# Patient Record
Sex: Male | Born: 1975
Health system: Southern US, Community
[De-identification: ages and names within clinical notes are randomized; demographics above are authoritative.]

## PROBLEM LIST (undated history)

## (undated) ENCOUNTER — Emergency Department (HOSPITAL_BASED_OUTPATIENT_CLINIC_OR_DEPARTMENT_OTHER): Admission: EM | Payer: Managed Care, Other (non HMO) | Source: Home / Self Care

## (undated) DIAGNOSIS — I10 Essential (primary) hypertension: Secondary | ICD-10-CM

## (undated) HISTORY — PX: FEMUR FRACTURE SURGERY: SHX633

---

## 2007-04-05 ENCOUNTER — Emergency Department (HOSPITAL_COMMUNITY): Admission: EM | Admit: 2007-04-05 | Discharge: 2007-04-06 | Payer: Self-pay | Admitting: Emergency Medicine

## 2007-05-01 ENCOUNTER — Emergency Department (HOSPITAL_COMMUNITY): Admission: EM | Admit: 2007-05-01 | Discharge: 2007-05-01 | Payer: Self-pay | Admitting: *Deleted

## 2010-09-20 ENCOUNTER — Emergency Department (INDEPENDENT_AMBULATORY_CARE_PROVIDER_SITE_OTHER): Payer: Managed Care, Other (non HMO)

## 2010-09-20 ENCOUNTER — Emergency Department (HOSPITAL_BASED_OUTPATIENT_CLINIC_OR_DEPARTMENT_OTHER)
Admission: EM | Admit: 2010-09-20 | Discharge: 2010-09-20 | Disposition: A | Discharge status: Home / Self Care | Payer: Managed Care, Other (non HMO) | Attending: Emergency Medicine | Admitting: Emergency Medicine

## 2010-09-20 DIAGNOSIS — M25579 Pain in unspecified ankle and joints of unspecified foot: Secondary | ICD-10-CM

## 2010-09-20 DIAGNOSIS — S82899A Other fracture of unspecified lower leg, initial encounter for closed fracture: Secondary | ICD-10-CM | POA: Insufficient documentation

## 2010-09-20 DIAGNOSIS — W230XXA Caught, crushed, jammed, or pinched between moving objects, initial encounter: Secondary | ICD-10-CM

## 2010-10-04 ENCOUNTER — Emergency Department (HOSPITAL_BASED_OUTPATIENT_CLINIC_OR_DEPARTMENT_OTHER)
Admission: EM | Admit: 2010-10-04 | Discharge: 2010-10-04 | Disposition: A | Discharge status: Home / Self Care | Payer: Managed Care, Other (non HMO) | Attending: Emergency Medicine | Admitting: Emergency Medicine

## 2010-10-04 DIAGNOSIS — E119 Type 2 diabetes mellitus without complications: Secondary | ICD-10-CM | POA: Insufficient documentation

## 2010-10-04 DIAGNOSIS — F17200 Nicotine dependence, unspecified, uncomplicated: Secondary | ICD-10-CM | POA: Insufficient documentation

## 2010-10-04 DIAGNOSIS — I10 Essential (primary) hypertension: Secondary | ICD-10-CM | POA: Insufficient documentation

## 2010-10-04 DIAGNOSIS — L089 Local infection of the skin and subcutaneous tissue, unspecified: Secondary | ICD-10-CM | POA: Insufficient documentation

## 2010-10-04 DIAGNOSIS — M7989 Other specified soft tissue disorders: Secondary | ICD-10-CM | POA: Insufficient documentation

## 2010-10-04 DIAGNOSIS — Z79899 Other long term (current) drug therapy: Secondary | ICD-10-CM | POA: Insufficient documentation

## 2011-03-23 LAB — I-STAT 8, (EC8 V) (CONVERTED LAB)
BUN: 10
Chloride: 100
HCT: 46
Hemoglobin: 15.6
Operator id: 257131
Sodium: 134 — ABNORMAL LOW

## 2011-08-19 ENCOUNTER — Encounter (HOSPITAL_BASED_OUTPATIENT_CLINIC_OR_DEPARTMENT_OTHER): Payer: Self-pay | Admitting: *Deleted

## 2011-08-19 ENCOUNTER — Emergency Department (HOSPITAL_BASED_OUTPATIENT_CLINIC_OR_DEPARTMENT_OTHER)
Admission: EM | Admit: 2011-08-19 | Discharge: 2011-08-20 | Disposition: A | Discharge status: Home / Self Care | Payer: Managed Care, Other (non HMO) | Attending: Emergency Medicine | Admitting: Emergency Medicine

## 2011-08-19 DIAGNOSIS — E860 Dehydration: Secondary | ICD-10-CM

## 2011-08-19 DIAGNOSIS — R112 Nausea with vomiting, unspecified: Secondary | ICD-10-CM | POA: Insufficient documentation

## 2011-08-19 DIAGNOSIS — E119 Type 2 diabetes mellitus without complications: Secondary | ICD-10-CM | POA: Insufficient documentation

## 2011-08-19 DIAGNOSIS — I10 Essential (primary) hypertension: Secondary | ICD-10-CM | POA: Insufficient documentation

## 2011-08-19 HISTORY — DX: Essential (primary) hypertension: I10

## 2011-08-19 LAB — CBC
Hemoglobin: 12.8 g/dL — ABNORMAL LOW (ref 13.0–17.0)
MCH: 32.9 pg (ref 26.0–34.0)
MCHC: 36.2 g/dL — ABNORMAL HIGH (ref 30.0–36.0)
Platelets: 267 10*3/uL (ref 150–400)

## 2011-08-19 LAB — BASIC METABOLIC PANEL
Calcium: 9.4 mg/dL (ref 8.4–10.5)
Creatinine, Ser: 0.8 mg/dL (ref 0.50–1.35)
GFR calc non Af Amer: >90 mL/min (ref >90)
Glucose, Bld: 239 mg/dL — ABNORMAL HIGH (ref 70–99)
Sodium: 136 mEq/L (ref 135–145)

## 2011-08-19 LAB — GLUCOSE, CAPILLARY: Glucose-Capillary: 246 mg/dL — ABNORMAL HIGH (ref 70–99)

## 2011-08-19 MED ORDER — SODIUM CHLORIDE 0.9 % IV BOLUS (SEPSIS)
1000.0000 mL | Freq: Once | INTRAVENOUS | Status: AC
  Administered 2011-08-19: 1000 mL via INTRAVENOUS

## 2011-08-19 MED ORDER — ONDANSETRON HCL 4 MG/2ML IJ SOLN
4.0000 mg | Freq: Once | INTRAMUSCULAR | Status: AC
  Administered 2011-08-19: 4 mg via INTRAVENOUS
  Filled 2011-08-19: qty 2

## 2011-08-19 NOTE — ED Notes (Signed)
Vomiting today. Denies other symptoms.

## 2011-08-19 NOTE — ED Notes (Signed)
Received pt. From triage, pt. Alert and oriented, ambulatory gait steady, NAD noted 

## 2011-08-20 MED ORDER — ONDANSETRON 8 MG PO TBDP: 8.0000 mg | ORAL_TABLET | Freq: Three times a day (TID) | ORAL | Status: AC | PRN

## 2011-08-20 NOTE — Discharge Instructions (Signed)
Dehydration, Adult Dehydration is when you lose more fluids from the body than you take in. Vital organs like the kidneys, brain, and heart cannot function without a proper amount of fluids and salt. Any loss of fluids from the body can cause dehydration.  CAUSES   Vomiting.   Diarrhea.   Excessive sweating.   Excessive urine output.   Fever.  SYMPTOMS  Mild dehydration  Thirst.   Dry lips.   Slightly dry mouth.  Moderate dehydration  Very dry mouth.   Sunken eyes.   Skin does not bounce back quickly when lightly pinched and released.   Dark urine and decreased urine production.   Decreased tear production.   Headache.  Severe dehydration  Very dry mouth.   Extreme thirst.   Rapid, weak pulse (more than 100 beats per minute at rest).   Cold hands and feet.   Not able to sweat in spite of heat and temperature.   Rapid breathing.   Blue lips.   Confusion and lethargy.   Difficulty being awakened.   Minimal urine production.   No tears.  DIAGNOSIS  Your caregiver will diagnose dehydration based on your symptoms and your exam. Blood and urine tests will help confirm the diagnosis. The diagnostic evaluation should also identify the cause of dehydration. TREATMENT  Treatment of mild or moderate dehydration can often be done at home by increasing the amount of fluids that you drink. It is best to drink small amounts of fluid more often. Drinking too much at one time can make vomiting worse. Refer to the home care instructions below. Severe dehydration needs to be treated at the hospital where you will probably be given intravenous (IV) fluids that contain water and electrolytes. HOME CARE INSTRUCTIONS   Ask your caregiver about specific rehydration instructions.   Drink enough fluids to keep your urine clear or pale yellow.   Drink small amounts frequently if you have nausea and vomiting.   Eat as you normally do.   Avoid:   Foods or drinks high in  sugar.   Carbonated drinks.   Juice.   Extremely hot or cold fluids.   Drinks with caffeine.   Fatty, greasy foods.   Alcohol.   Tobacco.   Overeating.   Gelatin desserts.   Wash your hands well to avoid spreading bacteria and viruses.   Only take over-the-counter or prescription medicines for pain, discomfort, or fever as directed by your caregiver.   Ask your caregiver if you should continue all prescribed and over-the-counter medicines.   Keep all follow-up appointments with your caregiver.  SEEK MEDICAL CARE IF:  You have abdominal pain and it increases or stays in one area (localizes).   You have a rash, stiff neck, or severe headache.   You are irritable, sleepy, or difficult to awaken.   You are weak, dizzy, or extremely thirsty.  SEEK IMMEDIATE MEDICAL CARE IF:   You are unable to keep fluids down or you get worse despite treatment.   You have frequent episodes of vomiting or diarrhea.   You have blood or green matter (bile) in your vomit.   You have blood in your stool or your stool looks black and tarry.   You have not urinated in 6 to 8 hours, or you have only urinated a small amount of very dark urine.   You have a fever.   You faint.  MAKE SURE YOU:   Understand these instructions.   Will watch your condition.     Will get help right away if you are not doing well or get worse.  Document Released: 05/30/2005 Document Revised: 05/19/2011 Document Reviewed: 01/17/2011 [employer] Patient Information 2012 [employer]. 

## 2011-08-20 NOTE — ED Provider Notes (Signed)
History     CSN: [id number]  Arrival date & time 08/19/11  2101   First MD Initiated Contact with Patient 08/19/11 2305      Chief Complaint  Patient presents with  . Emesis     Patient is a 36 y.o. male presenting with vomiting. The history is provided by the patient.  Emesis    patient reports nausea and vomiting today.  He denies diarrhea although states "I feel like I'm about to have diarrhea".  Denies fevers or chills.  His had no abdominal pain.  He denies chest pain.  He denies blood in his vomit.  Nothing worsens the symptoms.  Nothing improves his symptoms.  His symptoms are constant.  His pain is 0 at this time.  He reports several sick contacts at work with similar symptoms  Past Medical History  Diagnosis Date  . Diabetes mellitus   . Hypertension     History reviewed. No pertinent past surgical history.  No family history on file.  History  Substance Use Topics  . Smoking status: Never Smoker   . Smokeless tobacco: Not on file  . Alcohol Use: No      Review of Systems  Gastrointestinal: Positive for vomiting.  All other systems reviewed and are negative.    Allergies  Penicillins  Home Medications   Current Outpatient Rx  Name Route Sig Dispense Refill  . ONDANSETRON 8 MG PO TBDP Oral Take 1 tablet (8 mg total) by mouth every 8 (eight) hours as needed for nausea. 10 tablet 0    BP 130/91  Pulse 72  Temp(Src) 98.3 F (36.8 C) (Oral)  Resp 16  Ht 6\' 2"  (1.88 m)  Wt 220 lb (99.791 kg)  BMI 28.25 kg/m2  SpO2 96%  Physical Exam  Nursing note and vitals reviewed. Constitutional: He is oriented to person, place, and time. He appears well-developed and well-nourished.  HENT:  Head: Normocephalic and atraumatic.       Mucous membranes dry  Eyes: EOM are normal.  Neck: Normal range of motion.  Cardiovascular: Normal rate, regular rhythm, normal heart sounds and intact distal pulses.   Pulmonary/Chest: Effort normal and breath sounds normal.  No respiratory distress.  Abdominal: Soft. He exhibits no distension. There is no tenderness.  Musculoskeletal: Normal range of motion.  Neurological: He is alert and oriented to person, place, and time.  Skin: Skin is warm and dry.  Psychiatric: He has a normal mood and affect. Judgment normal.    ED Course  Procedures (including critical care time)  Labs Reviewed  GLUCOSE, CAPILLARY - Abnormal; Notable for the following:    Glucose-Capillary 246 (*)    All other components within normal limits  CBC - Abnormal; Notable for the following:    RBC 3.89 (*)    Hemoglobin 12.8 (*)    HCT 35.4 (*)    MCHC 36.2 (*)    All other components within normal limits  BASIC METABOLIC PANEL - Abnormal; Notable for the following:    Glucose, Bld 239 (*)    All other components within normal limits   No results found.   1. Nausea and vomiting   2. Dehydration       MDM  The patient's symptoms likely represent an early viral gastroenteritis.  He's been hydrated in the ER.  He feels much better.  Repeat abdominal exam is again benign.  DC home in good conditions with anttiemetics at home        [place]  Larena Glassman, MD 08/20/11 [phone]

## 2012-10-24 ENCOUNTER — Encounter: Payer: Self-pay | Admitting: Podiatry

## 2012-10-24 ENCOUNTER — Ambulatory Visit (INDEPENDENT_AMBULATORY_CARE_PROVIDER_SITE_OTHER): Payer: BC Managed Care – PPO | Admitting: Podiatry

## 2012-10-24 VITALS — BP 128/89 | HR 83 | Ht 74.0 in | Wt 212.0 lb

## 2012-10-24 DIAGNOSIS — B351 Tinea unguium: Secondary | ICD-10-CM

## 2012-10-24 DIAGNOSIS — M25579 Pain in unspecified ankle and joints of unspecified foot: Secondary | ICD-10-CM

## 2012-10-24 HISTORY — DX: Tinea unguium: B35.1

## 2012-10-24 HISTORY — DX: Pain in unspecified ankle and joints of unspecified foot: M25.579

## 2012-10-24 NOTE — Progress Notes (Signed)
Subjective: 37 y.o. year old NIDDM male patient presents complaining of painful nails. Patient requests toe nails trimmed.  Stated that his blood sugar this morning was 120. He wants to know if the nails can be treated. He is on his feet all day. He was seen at this office 2 years ago for painful heel and was treated with Cortisone injection.   Patient Summary List & History reviewed for allergies, medications, medical problems and surgical history.  Objective: Dermatologic:  Thick and hard dystrophic nails x 10. Dry flaky skin plantar bilateral. No open wound or skin lesions noted.  Vascular: All pedal pulses are palpable. No edema or erythema noted.   Orthopedic:  Mild Hallux valgus with bunion bilateral.  Neurologic:  All epicritic and tactile sensations grossly intact.   Assessment: Dystrophic mycotic nails x 10. Tinea pedis plantar bilateral.  Treatment: All mycotic nails debrided.  Advised to scrub both feet with Salsun blue shampoo after each shower.  Advised to have blood work done for liver function during his next visit to his PCP before starting on oral antifungal treatment.  Return in 3 months.

## 2012-11-01 ENCOUNTER — Emergency Department (HOSPITAL_BASED_OUTPATIENT_CLINIC_OR_DEPARTMENT_OTHER)
Admission: EM | Admit: 2012-11-01 | Discharge: 2012-11-01 | Disposition: A | Discharge status: Home / Self Care | Payer: BC Managed Care – PPO | Attending: Emergency Medicine | Admitting: Emergency Medicine

## 2012-11-01 ENCOUNTER — Emergency Department (HOSPITAL_BASED_OUTPATIENT_CLINIC_OR_DEPARTMENT_OTHER): Payer: BC Managed Care – PPO

## 2012-11-01 ENCOUNTER — Encounter (HOSPITAL_BASED_OUTPATIENT_CLINIC_OR_DEPARTMENT_OTHER): Payer: Self-pay | Admitting: *Deleted

## 2012-11-01 DIAGNOSIS — R112 Nausea with vomiting, unspecified: Secondary | ICD-10-CM | POA: Insufficient documentation

## 2012-11-01 DIAGNOSIS — R197 Diarrhea, unspecified: Secondary | ICD-10-CM

## 2012-11-01 DIAGNOSIS — K921 Melena: Secondary | ICD-10-CM | POA: Insufficient documentation

## 2012-11-01 DIAGNOSIS — I10 Essential (primary) hypertension: Secondary | ICD-10-CM | POA: Insufficient documentation

## 2012-11-01 DIAGNOSIS — E119 Type 2 diabetes mellitus without complications: Secondary | ICD-10-CM | POA: Insufficient documentation

## 2012-11-01 DIAGNOSIS — Z880 Allergy status to penicillin: Secondary | ICD-10-CM | POA: Insufficient documentation

## 2012-11-01 DIAGNOSIS — Z79899 Other long term (current) drug therapy: Secondary | ICD-10-CM | POA: Insufficient documentation

## 2012-11-01 DIAGNOSIS — R634 Abnormal weight loss: Secondary | ICD-10-CM | POA: Insufficient documentation

## 2012-11-01 LAB — OCCULT BLOOD X 1 CARD TO LAB, STOOL: Fecal Occult Bld: NEGATIVE

## 2012-11-01 LAB — CBC WITH DIFFERENTIAL/PLATELET
Basophils Absolute: 0 10*3/uL (ref 0.0–0.1)
Basophils Relative: 0 % (ref 0–1)
Eosinophils Absolute: 0.1 10*3/uL (ref 0.0–0.7)
Lymphs Abs: 1.3 10*3/uL (ref 0.7–4.0)
MCH: 33.2 pg (ref 26.0–34.0)
Neutrophils Relative %: 83 % — ABNORMAL HIGH (ref 43–77)
Platelets: 278 10*3/uL (ref 150–400)
RBC: 4.28 MIL/uL (ref 4.22–5.81)
RDW: 12.5 % (ref 11.5–15.5)

## 2012-11-01 LAB — URINALYSIS, ROUTINE W REFLEX MICROSCOPIC
Bilirubin Urine: NEGATIVE
Glucose, UA: NEGATIVE mg/dL
Ketones, ur: 15 mg/dL — AB
Specific Gravity, Urine: 1.029 (ref 1.005–1.030)
pH: 5.5 (ref 5.0–8.0)

## 2012-11-01 LAB — COMPREHENSIVE METABOLIC PANEL
ALT: 17 U/L (ref 0–53)
AST: 20 U/L (ref 0–37)
Albumin: 4.4 g/dL (ref 3.5–5.2)
Alkaline Phosphatase: 53 U/L (ref 39–117)
Calcium: 9.9 mg/dL (ref 8.4–10.5)
Glucose, Bld: 175 mg/dL — ABNORMAL HIGH (ref 70–99)
Potassium: 4.4 mEq/L (ref 3.5–5.1)
Sodium: 139 mEq/L (ref 135–145)
Total Protein: 7.6 g/dL (ref 6.0–8.3)

## 2012-11-01 MED ORDER — PROMETHAZINE HCL 25 MG PO TABS: 25.0000 mg | ORAL_TABLET | Freq: Four times a day (QID) | ORAL | Status: DC | PRN

## 2012-11-01 NOTE — ED Notes (Signed)
Patient states he developed nausea and vomiting x 4 this morning at 0645, followed by a normal bm with blood on the stool and tissue.  States he had progressed to 4 watery diarrheal stools.  Denies pain.  Pt states he went to see his primary doctor today and was given an appointment for tomorrow.  Wife is concerned that the patient's bp is up today, he has lost 8 lbs in the last month and is having rectal bleeding.  Wants to find out today what is going on.  Also is requesting an xray of right leg due to chronic pain from an old femur fracture.  Explained that they may have to follow up with their pcp, and was told that we are the emergency room and we can do anything.

## 2012-11-01 NOTE — ED Notes (Signed)
Upon d/c pt reports pain in right knee radiating to thigh x "months" and requesting an XR.  Denny Peon, PA-C informed and new orders received.  Pt informed of plan of care.

## 2012-11-01 NOTE — ED Notes (Signed)
Report received from Merril Abbe, RN, care assumed.

## 2012-11-01 NOTE — ED Provider Notes (Signed)
History     CSN: [id number]  Arrival date & time 11/01/12  1021   First MD Initiated Contact with Patient 11/01/12 1206      Chief Complaint  Patient presents with  . Emesis    (Consider location/radiation/quality/duration/timing/severity/associated sxs/prior treatment) HPI Pt is a 37yo male accompanied by wife c/o nausea and vomiting x3 this morning without blood or bile.  Pt then had a normal BM with blood on stool and tissue followed by watery diarrhea x4, w/o blood or mucous.   Pt was seen at PCP and given appointment for tomorrow.Denies pain or nausea at this time.  Pt also mentioned he has lost 8lbs over the past month unintentionally but has not mentioned this to his PCP.  Denies known hx of cancer.  Denies back pain, smoking or cough.  Denies fever, sick contacts, or recent travel.  Past Medical History  Diagnosis Date  . Diabetes mellitus   . Hypertension     Past Surgical History  Procedure Laterality Date  . Femur fracture surgery      right    No family history on file.  History  Substance Use Topics  . Smoking status: Never Smoker   . Smokeless tobacco: Not on file  . Alcohol Use: No      Review of Systems  Constitutional: Positive for unexpected weight change ( 8lbs in past month). Negative for fever and chills.  Gastrointestinal: Positive for nausea, vomiting, diarrhea and blood in stool. Negative for abdominal pain.  All other systems reviewed and are negative.    Allergies  Penicillins  Home Medications   Current Outpatient Rx  Name  Route  Sig  Dispense  Refill  . metFORMIN (GLUCOPHAGE) 500 MG tablet   Oral   Take 500 mg by mouth 2 (two) times daily with a meal.         . benazepril (LOTENSIN) 20 MG tablet   Oral   Take 20 mg by mouth daily.         Marland Kitchen glyBURIDE-metformin (GLUCOVANCE) 5-500 MG per tablet   Oral   Take 2 tablets by mouth 2 (two) times daily with a meal.         . promethazine (PHENERGAN) 25 MG tablet   Oral    Take 1 tablet (25 mg total) by mouth every 6 (six) hours as needed for nausea.   12 tablet   0     BP 159/94  Pulse 74  Temp(Src) 98.2 F (36.8 C) (Oral)  Resp 20  Ht 6\' 2"  (1.88 m)  Wt 208 lb (94.348 kg)  BMI 26.69 kg/m2  SpO2 100%  Physical Exam  Nursing note and vitals reviewed. Constitutional: He appears well-developed and well-nourished. No distress.  Pt lying in bed comfortably watching television with wife in room, NAD  HENT:  Head: Normocephalic and atraumatic.  Eyes: Conjunctivae are normal. No scleral icterus.  Neck: Normal range of motion.  Cardiovascular: Normal rate, regular rhythm and normal heart sounds.   Pulmonary/Chest: Effort normal and breath sounds normal. No respiratory distress. He has no wheezes. He has no rales. He exhibits no tenderness.  Abdominal: Soft. Bowel sounds are normal. He exhibits no distension and no mass. There is no tenderness. There is no rebound and no guarding.  Soft, NTND, nl bowel sounds  Genitourinary: Rectum normal. Rectal exam shows no external hemorrhoid, no fissure and anal tone normal. Guaiac negative stool.  Musculoskeletal: Normal range of motion.  Neurological: He is alert.  Skin:  Skin is warm and dry. He is not diaphoretic.    ED Course  Procedures (including critical care time)  Labs Reviewed  CBC WITH DIFFERENTIAL - Abnormal; Notable for the following:    WBC 11.2 (*)    MCHC 36.1 (*)    Neutrophils Relative % 83 (*)    Neutro Abs 9.3 (*)    All other components within normal limits  COMPREHENSIVE METABOLIC PANEL - Abnormal; Notable for the following:    Glucose, Bld 175 (*)    All other components within normal limits  URINALYSIS, ROUTINE W REFLEX MICROSCOPIC - Abnormal; Notable for the following:    Ketones, ur 15 (*)    All other components within normal limits  OCCULT BLOOD X 1 CARD TO LAB, STOOL   Dg Femur Right  11/01/2012   *RADIOLOGY REPORT*  Clinical Data: Emesis.  History of femur fracture 10 years  ago.  RIGHT FEMUR - 2 VIEW  Comparison: None  Findings: Postsurgical changes compatible with prior ORIF of femur fracture noted.  There is an intramedullary rod and screw device in place.  No evidence for acute fracture or dislocation identified.  Heterotopic bone formation is noted surrounding the greater trochanter of the proximal right femur.  IMPRESSION:  1.  Previous ORIF of right femur fracture. 2.  No acute findings noted   Original Report Authenticated By: Signa Kell, M.D.     1. Nausea and vomiting in adult   2. Diarrhea   3. Weight loss       MDM  Pt c/o non-bloody, non-bilious emesis x3 this morning, 1 episode of stool with blood on it and 4 episodes of watery diarrhea without blood.   Pt also reports unexpected wt loss of 8lb over the past month.  Denies any other symptoms.  Denies fever, cough, chest pain, abd pain, back pain. PE: pt in NAD, abd-soft, NTND, nl bowel sounds, DRE: no external hemorrhoids, nl sphincter tone, nl obvious blood on glove  Labs: nl hgb/htc UA: post for ketones Hemoccult:  Neg  Rx: phenergan   Pt insisted that he wants and xray of his right leg due to pain.  On initial H&P he did not meniton any pain to me after I asked multiple times.  His family was annoyed he had to repeat his Hx several times between nurses and myself however still never gave the whole story until being discharged, mother came in and demanded an xray be done.  Plain films-heterotopic bone formation noted around greater trochanter or proximal right femur.  Pt was having pain towards medial aspect of left thigh.    Discussed pt with Dr. Ignacia Palma who agreed with assessment and plan.  Pt may be discharged home to f/u with PCP Dr. Roxan Hockey tomorrow.   No need for further testing at this time.  Vitals: unremarkable. Discharged in stable condition.           Junius Finner, PA-C 11/03/12 [phone]

## 2012-11-08 NOTE — ED Provider Notes (Signed)
Medical screening examination/treatment/procedure(s) were performed by non-physician practitioner and as supervising physician I was immediately available for consultation/collaboration.   Carleene Cooper III, MD 11/08/12 [phone]

## 2013-05-03 ENCOUNTER — Ambulatory Visit (INDEPENDENT_AMBULATORY_CARE_PROVIDER_SITE_OTHER): Payer: BC Managed Care – PPO | Admitting: Podiatry

## 2013-05-03 ENCOUNTER — Encounter: Payer: Self-pay | Admitting: Podiatry

## 2013-05-03 VITALS — BP 147/92 | HR 77 | Ht 74.0 in | Wt 208.0 lb

## 2013-05-03 DIAGNOSIS — IMO0001 Reserved for inherently not codable concepts without codable children: Secondary | ICD-10-CM

## 2013-05-03 DIAGNOSIS — B351 Tinea unguium: Secondary | ICD-10-CM

## 2013-05-03 DIAGNOSIS — M25579 Pain in unspecified ankle and joints of unspecified foot: Secondary | ICD-10-CM

## 2013-05-03 NOTE — Patient Instructions (Signed)
Seen for hypertrophic nails. All nails debrided. Return in 3 months or as needed.  

## 2013-05-03 NOTE — Progress Notes (Signed)
37 year old male presents requesting toe nails trimmed. They are too thick and long, hurts the toes.  Objective: Hypertrophic nails x 10. Thick scaly skin in bottom of both feet. White discolored skin in 4th web space left. All neurovascular status are within normal.  Assessment: Onychomycosis, painful.   Plan:  Palliation prn. Debrided all nails.

## 2015-08-10 DIAGNOSIS — E1165 Type 2 diabetes mellitus with hyperglycemia: Secondary | ICD-10-CM

## 2015-08-10 HISTORY — DX: Type 2 diabetes mellitus with hyperglycemia: E11.65

## 2015-12-09 ENCOUNTER — Emergency Department (HOSPITAL_BASED_OUTPATIENT_CLINIC_OR_DEPARTMENT_OTHER)
Admission: EM | Admit: 2015-12-09 | Discharge: 2015-12-09 | Disposition: A | Discharge status: Home / Self Care | Payer: Managed Care, Other (non HMO) | Attending: Emergency Medicine | Admitting: Emergency Medicine

## 2015-12-09 ENCOUNTER — Encounter (HOSPITAL_BASED_OUTPATIENT_CLINIC_OR_DEPARTMENT_OTHER): Payer: Self-pay | Admitting: Emergency Medicine

## 2015-12-09 DIAGNOSIS — Z7984 Long term (current) use of oral hypoglycemic drugs: Secondary | ICD-10-CM | POA: Insufficient documentation

## 2015-12-09 DIAGNOSIS — IMO0002 Reserved for concepts with insufficient information to code with codable children: Secondary | ICD-10-CM

## 2015-12-09 DIAGNOSIS — R110 Nausea: Secondary | ICD-10-CM

## 2015-12-09 DIAGNOSIS — E1365 Other specified diabetes mellitus with hyperglycemia: Secondary | ICD-10-CM

## 2015-12-09 DIAGNOSIS — I10 Essential (primary) hypertension: Secondary | ICD-10-CM | POA: Insufficient documentation

## 2015-12-09 DIAGNOSIS — Z79899 Other long term (current) drug therapy: Secondary | ICD-10-CM | POA: Insufficient documentation

## 2015-12-09 DIAGNOSIS — E138 Other specified diabetes mellitus with unspecified complications: Secondary | ICD-10-CM | POA: Insufficient documentation

## 2015-12-09 LAB — CBC
HCT: 39.5 % (ref 39.0–52.0)
HEMOGLOBIN: 14.1 g/dL (ref 13.0–17.0)
MCH: 33.1 pg (ref 26.0–34.0)
MCHC: 35.7 g/dL (ref 30.0–36.0)
MCV: 92.7 fL (ref 78.0–100.0)
Platelets: 285 10*3/uL (ref 150–400)
RBC: 4.26 MIL/uL (ref 4.22–5.81)
RDW: 12.3 % (ref 11.5–15.5)
WBC: 4.9 10*3/uL (ref 4.0–10.5)

## 2015-12-09 LAB — COMPREHENSIVE METABOLIC PANEL
ALBUMIN: 4.3 g/dL (ref 3.5–5.0)
ALK PHOS: 59 U/L (ref 38–126)
ALT: 16 U/L — ABNORMAL LOW (ref 17–63)
ANION GAP: 8 (ref 5–15)
AST: 17 U/L (ref 15–41)
BUN: 8 mg/dL (ref 6–20)
CALCIUM: 9.2 mg/dL (ref 8.9–10.3)
CO2: 26 mmol/L (ref 22–32)
Chloride: 101 mmol/L (ref 101–111)
Creatinine, Ser: 0.79 mg/dL (ref 0.61–1.24)
GFR calc non Af Amer: >60 mL/min (ref >60)
Glucose, Bld: 207 mg/dL — ABNORMAL HIGH (ref 65–99)
POTASSIUM: 3.5 mmol/L (ref 3.5–5.1)
SODIUM: 135 mmol/L (ref 135–145)
TOTAL PROTEIN: 7.3 g/dL (ref 6.5–8.1)
Total Bilirubin: 1 mg/dL (ref 0.3–1.2)

## 2015-12-09 LAB — URINALYSIS, ROUTINE W REFLEX MICROSCOPIC
HGB URINE DIPSTICK: NEGATIVE
KETONES UR: 40 mg/dL — AB
Leukocytes, UA: NEGATIVE
Nitrite: NEGATIVE
PH: 6 (ref 5.0–8.0)
PROTEIN: NEGATIVE mg/dL
Specific Gravity, Urine: 1.028 (ref 1.005–1.030)

## 2015-12-09 LAB — CBG MONITORING, ED
GLUCOSE-CAPILLARY: 176 mg/dL — AB (ref 65–99)
Glucose-Capillary: 194 mg/dL — ABNORMAL HIGH (ref 65–99)

## 2015-12-09 LAB — URINE MICROSCOPIC-ADD ON

## 2015-12-09 LAB — LIPASE, BLOOD: LIPASE: 15 U/L (ref 11–51)

## 2015-12-09 MED ORDER — SODIUM CHLORIDE 0.9 % IV BOLUS (SEPSIS)
1000.0000 mL | Freq: Once | INTRAVENOUS | Status: AC
  Administered 2015-12-09: 1000 mL via INTRAVENOUS

## 2015-12-09 NOTE — ED Notes (Signed)
Nausea, headache, abdominal pain since Saturday.  Last medication today, was glyburide and metformin.  Was recently on lantus but cannot afford.

## 2015-12-09 NOTE — Discharge Instructions (Signed)
You were seen and evaluated today for your nausea and inability to eat. Likely this is related to your uncontrolled blood sugar. There was no acid in your blood sugar met this time but if your blood sugars not better controlled this can occur. Please follow-up with your primary care physician regarding better management of your blood sugar. I have also provided G with a coupon for Lantus that should work at [employer].  Diabetes Mellitus, Adult Type 2 diabetes mellitus, often simply referred to as type 2 diabetes, is a long-lasting (chronic) disease. In type 2 diabetes, the pancreas does not make enough insulin (a hormone), the cells are less responsive to the insulin that is made (insulin resistance), or both. Normally, insulin moves sugars from food into the tissue cells. The tissue cells use the sugars for energy. The lack of insulin or the lack of normal response to insulin causes excess sugars to build up in the blood instead of going into the tissue cells. As a result, high blood sugar (hyperglycemia) develops. The effect of high sugar (glucose) levels can cause many complications. Type 2 diabetes was also previously called adult-onset diabetes, but it can occur at any age.  RISK FACTORS  A person is predisposed to developing type 2 diabetes if someone in the family has the disease and also has one or more of the following primary risk factors:  Weight gain, or being overweight or obese.  An inactive lifestyle.  A history of consistently eating high-calorie foods. Maintaining a normal weight and regular physical activity can reduce the chance of developing type 2 diabetes. SYMPTOMS  A person with type 2 diabetes may not show symptoms initially. The symptoms of type 2 diabetes appear slowly. The symptoms include:  Increased thirst (polydipsia).  Increased urination (polyuria).  Increased urination during the night (nocturia).  Sudden or unexplained weight changes.  Frequent, recurring  infections.  Tiredness (fatigue).  Weakness.  Vision changes, such as blurred vision.  Fruity smell to your breath.  Abdominal pain.  Nausea or vomiting.  Cuts or bruises which are slow to heal.  Tingling or numbness in the hands or feet.  An open skin wound (ulcer). DIAGNOSIS Type 2 diabetes is frequently not diagnosed until complications of diabetes are present. Type 2 diabetes is diagnosed when symptoms or complications are present and when blood glucose levels are increased. Your blood glucose level may be checked by one or more of the following blood tests:  A fasting blood glucose test. You will not be allowed to eat for at least 8 hours before a blood sample is taken.  A random blood glucose test. Your blood glucose is checked at any time of the day regardless of when you ate.  A hemoglobin A1c blood glucose test. A hemoglobin A1c test provides information about blood glucose control over the previous 3 months.  An oral glucose tolerance test (OGTT). Your blood glucose is measured after you have not eaten (fasted) for 2 hours and then after you drink a glucose-containing beverage. TREATMENT   You may need to take insulin or diabetes medicine daily to keep blood glucose levels in the desired range.  If you use insulin, you may need to adjust the dosage depending on the carbohydrates that you eat with each meal or snack.  Lifestyle changes are recommended as part of your treatment. These may include:  Following an individualized diet plan developed by a nutritionist or dietitian.  Exercising daily. Your health care providers will set individualized treatment goals  for you based on your age, your medicines, how long you have had diabetes, and any other medical conditions you have. Generally, the goal of treatment is to maintain the following blood glucose levels:  Before meals (preprandial): 80-130 mg/dL.  After meals (postprandial): below 180 mg/dL.  A1c: less than  6.5-7%. HOME CARE INSTRUCTIONS   Have your hemoglobin A1c level checked twice a year.  Perform daily blood glucose monitoring as directed by your health care provider.  Monitor urine ketones when you are ill and as directed by your health care provider.  Take your diabetes medicine or insulin as directed by your health care provider to maintain your blood glucose levels in the desired range.  Never run out of diabetes medicine or insulin. It is needed every day.  If you are using insulin, you may need to adjust the amount of insulin given based on your intake of carbohydrates. Carbohydrates can raise blood glucose levels but need to be included in your diet. Carbohydrates provide vitamins, minerals, and fiber which are an essential part of a healthy diet. Carbohydrates are found in fruits, vegetables, whole grains, dairy products, legumes, and foods containing added sugars.  Eat healthy foods. You should make an appointment to see a registered dietitian to help you create an eating plan that is right for you.  Lose weight if you are overweight.  Carry a medical alert card or wear your medical alert jewelry.  Carry a 15-gram carbohydrate snack with you at all times to treat low blood glucose (hypoglycemia). Some examples of 15-gram carbohydrate snacks include:  Glucose tablets, 3 or 4.  Glucose gel, 15-gram tube.  Raisins, 2 tablespoons (24 grams).  Jelly beans, 6.  Animal crackers, 8.  Regular pop, 4 ounces (120 mL).  Gummy treats, 9.  Recognize hypoglycemia. Hypoglycemia occurs with blood glucose levels of 70 mg/dL and below. The risk for hypoglycemia increases when fasting or skipping meals, during or after intense exercise, and during sleep. Hypoglycemia symptoms can include:  Tremors or shakes.  Decreased ability to concentrate.  Sweating.  Increased heart rate.  Headache.  Dry mouth.  Hunger.  Irritability.  Anxiety.  Restless sleep.  Altered speech or  coordination.  Confusion.  Treat hypoglycemia promptly. If you are alert and able to safely swallow, follow the 15:15 rule:  Take 15-20 grams of rapid-acting glucose or carbohydrate. Rapid-acting options include glucose gel, glucose tablets, or 4 ounces (120 mL) of fruit juice, regular soda, or low-fat milk.  Check your blood glucose level 15 minutes after taking the glucose.  Take 15-20 grams more of glucose if the repeat blood glucose level is still 70 mg/dL or below.  Eat a meal or snack within 1 hour once blood glucose levels return to normal.  Be alert to feeling very thirsty and urinating more frequently than usual, which are early signs of hyperglycemia. An early awareness of hyperglycemia allows for prompt treatment. Treat hyperglycemia as directed by your health care provider.  Engage in at least 150 minutes of moderate-intensity physical activity a week, spread over at least 3 days of the week or as directed by your health care provider. In addition, you should engage in resistance exercise at least 2 times a week or as directed by your health care provider. Try to spend no more than 90 minutes at one time inactive.  Adjust your medicine and food intake as needed if you start a new exercise or sport.  Follow your sick-day plan anytime you are unable to  eat or drink as usual.  Do not use any tobacco products including cigarettes, chewing tobacco, or electronic cigarettes. If you need help quitting, ask your health care provider.  Limit alcohol intake to no more than 1 drink per day for nonpregnant women and 2 drinks per day for men. You should drink alcohol only when you are also eating food. Talk with your health care provider whether alcohol is safe for you. Tell your health care provider if you drink alcohol several times a week.  Keep all follow-up visits as directed by your health care provider. This is important.  Schedule an eye exam soon after the diagnosis of type 2  diabetes and then annually.  Perform daily skin and foot care. Examine your skin and feet daily for cuts, bruises, redness, nail problems, bleeding, blisters, or sores. A foot exam by a health care provider should be done annually.  Brush your teeth and gums at least twice a day and floss at least once a day. Follow up with your dentist regularly.  Share your diabetes management plan with your workplace or school.  Keep your immunizations up to date. It is recommended that you receive a flu (influenza) vaccine every year. It is also recommended that you receive a pneumonia (pneumococcal) vaccine. If you are 10 years of age or older and have never received a pneumonia vaccine, this vaccine may be given as a series of two separate shots. Ask your health care provider which additional vaccines may be recommended.  Learn to manage stress.  Obtain ongoing diabetes education and support as needed.  Participate in or seek rehabilitation as needed to maintain or improve independence and quality of life. Request a physical or occupational therapy referral if you are having foot or hand numbness, or difficulties with grooming, dressing, eating, or physical activity. SEEK MEDICAL CARE IF:   You are unable to eat food or drink fluids for more than 6 hours.  You have nausea and vomiting for more than 6 hours.  Your blood glucose level is over 240 mg/dL.  There is a change in mental status.  You develop an additional serious illness.  You have diarrhea for more than 6 hours.  You have been sick or have had a fever for a couple of days and are not getting better.  You have pain during any physical activity.  SEEK IMMEDIATE MEDICAL CARE IF:  You have difficulty breathing.  You have moderate to large ketone levels.   This information is not intended to replace advice given to you by your health care provider. Make sure you discuss any questions you have with your health care provider.     Document Released: 05/30/2005 Document Revised: 02/18/2015 Document Reviewed: 12/27/2011 Elsevier Interactive Patient Education [employer].

## 2015-12-09 NOTE — ED Provider Notes (Signed)
CSN: [id number]     Arrival date & time 12/09/15  1037 History   First MD Initiated Contact with Patient 12/09/15 1042     Chief Complaint  Patient presents with  . Abdominal Pain     (Consider location/radiation/quality/duration/timing/severity/associated sxs/prior Treatment) HPI Comments: 40 year old male with history of diabetes mellitus, hypertension presents for nausea and decreased appetite. The patient says that he has not been feeling well for the last 4 days. He denies any known foods that seemed to cause onset of the symptoms although he did eat at a cookout on Saturday. Denies fevers or chills. He states that he has not been able to afford his Lantus so he started retaking his oral diabetic medications. He has not followed up with his primary care physician regarding this. He says he has not been able to eat anything since yesterday in the early afternoon.   Past Medical History  Diagnosis Date  . Diabetes mellitus   . Hypertension    Past Surgical History  Procedure Laterality Date  . Femur fracture surgery      right   No family history on file. Social History  Substance Use Topics  . Smoking status: Never Smoker   . Smokeless tobacco: Never Used  . Alcohol Use: No    Review of Systems  Constitutional: Negative for fever, chills and fatigue.  HENT: Negative for congestion, postnasal drip, rhinorrhea and sinus pressure.   Eyes: Negative for visual disturbance.  Respiratory: Negative for cough, chest tightness and shortness of breath.   Cardiovascular: Negative for chest pain, palpitations and leg swelling.  Gastrointestinal: Positive for nausea and abdominal pain (intermittent cramping all over his abdomen). Negative for vomiting, diarrhea and constipation.  Genitourinary: Negative for dysuria, urgency and hematuria.  Musculoskeletal: Negative for myalgias and back pain.  Skin: Negative for rash.  Neurological: Negative for dizziness and weakness.  Hematological:  Does not bruise/bleed easily.      Allergies  Penicillins  Home Medications   Prior to Admission medications   Medication Sig Start Date End Date Taking? Authorizing Provider  Cinnamon 500 MG capsule Take 500 mg by mouth daily.   Yes Historical Provider, MD  benazepril (LOTENSIN) 20 MG tablet Take 20 mg by mouth daily.    Historical Provider, MD  glyBURIDE-metformin (GLUCOVANCE) 5-500 MG per tablet Take 2 tablets by mouth 2 (two) times daily with a meal.    Historical Provider, MD   BP 164/100 mmHg  Pulse 89  Temp(Src) 98.2 F (36.8 C) (Oral)  Resp 16  Ht 6\' 2"  (1.88 m)  Wt 208 lb (94.348 kg)  BMI 26.69 kg/m2  SpO2 98% Physical Exam  Constitutional: He is oriented to person, place, and time. He appears well-developed and well-nourished. No distress.  HENT:  Head: Normocephalic and atraumatic.  Right Ear: External ear normal.  Left Ear: External ear normal.  Mouth/Throat: Oropharynx is clear and moist. No oropharyngeal exudate.  Eyes: EOM are normal. Pupils are equal, round, and reactive to light.  Neck: Normal range of motion. Neck supple.  Cardiovascular: Normal rate, regular rhythm, normal heart sounds and intact distal pulses.   No murmur heard. Pulmonary/Chest: Effort normal. No respiratory distress. He has no wheezes. He has no rales.  Abdominal: Soft. He exhibits no distension. There is no tenderness.  Musculoskeletal: He exhibits no edema.  Neurological: He is alert and oriented to person, place, and time.  Skin: Skin is warm and dry. No rash noted. He is not diaphoretic.  Vitals reviewed.  ED Course  Procedures (including critical care time) Labs Review Labs Reviewed  URINALYSIS, ROUTINE W REFLEX MICROSCOPIC (NOT AT [facility]) - Abnormal; Notable for the following:    Color, Urine AMBER (*)    Glucose, UA >1000 (*)    Bilirubin Urine SMALL (*)    Ketones, ur 40 (*)    All other components within normal limits  COMPREHENSIVE METABOLIC PANEL - Abnormal; Notable  for the following:    Glucose, Bld 207 (*)    ALT 16 (*)    All other components within normal limits  URINE MICROSCOPIC-ADD ON - Abnormal; Notable for the following:    Squamous Epithelial / LPF 0-5 (*)    Bacteria, UA RARE (*)    All other components within normal limits  CBG MONITORING, ED - Abnormal; Notable for the following:    Glucose-Capillary 194 (*)    All other components within normal limits  CBC  LIPASE, BLOOD    Imaging Review No results found. I have personally reviewed and evaluated these images and lab results as part of my medical decision-making.   EKG Interpretation None      MDM  Patient seen and evaluated in stable condition. Patient sitting up at bedside in a chair and not on stretcher during initial evaluation. He was very well-appearing. He did move to the stretcher for examination. He had a benign nontender abdomen. Laboratory studies with hyperglycemia without acidosis and urine with some ketones. Patient was given an IV fluid bolus with improvement in symptoms. He tolerated a by mouth challenge and felt comfortable with plan for discharge. He was provided with a coupon for Lantus and instructed to call his primary care physician's office regarding his inability to afford his Lantus and the need for better glucose control. He was discharged home in stable condition. Final diagnoses:  Nausea  Uncontrolled other specified diabetes mellitus with complication (HCC)    1. Nausea 2. Uncontrolled diabetes    Leta BaptistEmily Roe Endy Easterly, MD 12/09/15 1226

## 2015-12-09 NOTE — ED Notes (Signed)
Pt given d/c instructions as per chart. Verbalizes understanding. No questions. 

## 2016-01-02 ENCOUNTER — Emergency Department (HOSPITAL_COMMUNITY): Payer: Self-pay

## 2016-01-02 ENCOUNTER — Emergency Department (HOSPITAL_COMMUNITY): Payer: Self-pay | Admitting: Anesthesiology

## 2016-01-02 ENCOUNTER — Encounter (HOSPITAL_COMMUNITY)
Admission: EM | Disposition: A | Discharge status: Home Health | Payer: Self-pay | Source: Home / Self Care | Attending: Internal Medicine

## 2016-01-02 ENCOUNTER — Inpatient Hospital Stay (HOSPITAL_COMMUNITY)
Admission: EM | Admit: 2016-01-02 | Discharge: 2016-01-05 | DRG: 581 | Disposition: A | Discharge status: Home Health | Payer: Self-pay | Attending: Internal Medicine | Admitting: Internal Medicine

## 2016-01-02 ENCOUNTER — Encounter (HOSPITAL_COMMUNITY): Payer: Self-pay

## 2016-01-02 DIAGNOSIS — Z880 Allergy status to penicillin: Secondary | ICD-10-CM

## 2016-01-02 DIAGNOSIS — I10 Essential (primary) hypertension: Secondary | ICD-10-CM

## 2016-01-02 DIAGNOSIS — Z598 Other problems related to housing and economic circumstances: Secondary | ICD-10-CM

## 2016-01-02 DIAGNOSIS — Z794 Long term (current) use of insulin: Secondary | ICD-10-CM

## 2016-01-02 DIAGNOSIS — R609 Edema, unspecified: Secondary | ICD-10-CM | POA: Diagnosis present

## 2016-01-02 DIAGNOSIS — Z720 Tobacco use: Secondary | ICD-10-CM | POA: Diagnosis present

## 2016-01-02 DIAGNOSIS — N433 Hydrocele, unspecified: Secondary | ICD-10-CM | POA: Diagnosis present

## 2016-01-02 DIAGNOSIS — E119 Type 2 diabetes mellitus without complications: Secondary | ICD-10-CM

## 2016-01-02 DIAGNOSIS — L02215 Cutaneous abscess of perineum: Principal | ICD-10-CM | POA: Diagnosis present

## 2016-01-02 DIAGNOSIS — F17210 Nicotine dependence, cigarettes, uncomplicated: Secondary | ICD-10-CM | POA: Diagnosis present

## 2016-01-02 DIAGNOSIS — N492 Inflammatory disorders of scrotum: Secondary | ICD-10-CM | POA: Diagnosis present

## 2016-01-02 DIAGNOSIS — E876 Hypokalemia: Secondary | ICD-10-CM | POA: Diagnosis present

## 2016-01-02 DIAGNOSIS — Z5989 Other problems related to housing and economic circumstances: Secondary | ICD-10-CM

## 2016-01-02 DIAGNOSIS — E1165 Type 2 diabetes mellitus with hyperglycemia: Secondary | ICD-10-CM | POA: Diagnosis present

## 2016-01-02 HISTORY — DX: Essential (primary) hypertension: I10

## 2016-01-02 HISTORY — DX: Type 2 diabetes mellitus without complications: Z79.4

## 2016-01-02 HISTORY — DX: Type 2 diabetes mellitus without complications: E11.9

## 2016-01-02 HISTORY — PX: INCISION AND DRAINAGE PERIRECTAL ABSCESS: SHX1804

## 2016-01-02 HISTORY — DX: Tobacco use: Z72.0

## 2016-01-02 LAB — COMPREHENSIVE METABOLIC PANEL
ALT: 14 U/L — AB (ref 17–63)
AST: 19 U/L (ref 15–41)
Albumin: 4.1 g/dL (ref 3.5–5.0)
Alkaline Phosphatase: 68 U/L (ref 38–126)
Anion gap: 9 (ref 5–15)
BILIRUBIN TOTAL: 1.4 mg/dL — AB (ref 0.3–1.2)
BUN: 9 mg/dL (ref 6–20)
CHLORIDE: 105 mmol/L (ref 101–111)
CO2: 23 mmol/L (ref 22–32)
CREATININE: 0.88 mg/dL (ref 0.61–1.24)
Calcium: 9.3 mg/dL (ref 8.9–10.3)
Glucose, Bld: 178 mg/dL — ABNORMAL HIGH (ref 65–99)
Potassium: 3.5 mmol/L (ref 3.5–5.1)
Sodium: 137 mmol/L (ref 135–145)
TOTAL PROTEIN: 7.2 g/dL (ref 6.5–8.1)

## 2016-01-02 LAB — GLUCOSE, CAPILLARY
GLUCOSE-CAPILLARY: 116 mg/dL — AB (ref 65–99)
Glucose-Capillary: 165 mg/dL — ABNORMAL HIGH (ref 65–99)
Glucose-Capillary: 216 mg/dL — ABNORMAL HIGH (ref 65–99)

## 2016-01-02 LAB — CBC WITH DIFFERENTIAL/PLATELET
BASOS PCT: 0 %
Basophils Absolute: 0 10*3/uL (ref 0.0–0.1)
EOS PCT: 1 %
Eosinophils Absolute: 0.1 10*3/uL (ref 0.0–0.7)
HCT: 37.4 % — ABNORMAL LOW (ref 39.0–52.0)
Hemoglobin: 13.3 g/dL (ref 13.0–17.0)
LYMPHS ABS: 1.8 10*3/uL (ref 0.7–4.0)
Lymphocytes Relative: 16 %
MCH: 33.6 pg (ref 26.0–34.0)
MCHC: 35.6 g/dL (ref 30.0–36.0)
MCV: 94.4 fL (ref 78.0–100.0)
MONO ABS: 1.5 10*3/uL — AB (ref 0.1–1.0)
Monocytes Relative: 13 %
Neutro Abs: 7.9 10*3/uL — ABNORMAL HIGH (ref 1.7–7.7)
Neutrophils Relative %: 70 %
PLATELETS: 250 10*3/uL (ref 150–400)
RBC: 3.96 MIL/uL — ABNORMAL LOW (ref 4.22–5.81)
RDW: 13 % (ref 11.5–15.5)
WBC: 11.3 10*3/uL — ABNORMAL HIGH (ref 4.0–10.5)

## 2016-01-02 LAB — CBG MONITORING, ED: Glucose-Capillary: 182 mg/dL — ABNORMAL HIGH (ref 65–99)

## 2016-01-02 LAB — LACTIC ACID, PLASMA
Lactic Acid, Venous: 0.8 mmol/L (ref 0.5–1.9)
Lactic Acid, Venous: 1 mmol/L (ref 0.5–1.9)

## 2016-01-02 LAB — CK: CK TOTAL: 225 U/L (ref 49–397)

## 2016-01-02 LAB — PROCALCITONIN

## 2016-01-02 SURGERY — INCISION AND DRAINAGE, ABSCESS, PERIRECTAL
Anesthesia: General | Site: Rectum

## 2016-01-02 MED ORDER — INSULIN GLARGINE 100 UNIT/ML ~~LOC~~ SOLN
16.0000 [IU] | Freq: Every morning | SUBCUTANEOUS | Status: DC
  Administered 2016-01-03 – 2016-01-04 (×2): 16 [IU] via SUBCUTANEOUS
  Filled 2016-01-02 (×2): qty 0.16

## 2016-01-02 MED ORDER — 0.9 % SODIUM CHLORIDE (POUR BTL) OPTIME
TOPICAL | Status: DC | PRN
  Administered 2016-01-02: 1000 mL

## 2016-01-02 MED ORDER — INSULIN ASPART 100 UNIT/ML ~~LOC~~ SOLN
0.0000 [IU] | Freq: Three times a day (TID) | SUBCUTANEOUS | Status: DC
  Administered 2016-01-03: 3 [IU] via SUBCUTANEOUS
  Administered 2016-01-03: 2 [IU] via SUBCUTANEOUS
  Administered 2016-01-03 – 2016-01-04 (×3): 3 [IU] via SUBCUTANEOUS

## 2016-01-02 MED ORDER — PROPOFOL 10 MG/ML IV BOLUS
INTRAVENOUS | Status: AC
  Filled 2016-01-02: qty 20

## 2016-01-02 MED ORDER — MIDAZOLAM HCL 5 MG/5ML IJ SOLN
INTRAMUSCULAR | Status: DC | PRN
  Administered 2016-01-02: 2 mg via INTRAVENOUS

## 2016-01-02 MED ORDER — PROPOFOL 10 MG/ML IV BOLUS
INTRAVENOUS | Status: DC | PRN
  Administered 2016-01-02: 200 mg via INTRAVENOUS
  Administered 2016-01-02: 40 mg via INTRAVENOUS

## 2016-01-02 MED ORDER — VANCOMYCIN HCL IN DEXTROSE 1-5 GM/200ML-% IV SOLN
INTRAVENOUS | Status: AC
  Administered 2016-01-02: 1000 mg via INTRAVENOUS
  Filled 2016-01-02: qty 200

## 2016-01-02 MED ORDER — ENOXAPARIN SODIUM 40 MG/0.4ML ~~LOC~~ SOLN
40.0000 mg | SUBCUTANEOUS | Status: DC
  Administered 2016-01-02 – 2016-01-04 (×3): 40 mg via SUBCUTANEOUS
  Filled 2016-01-02 (×3): qty 0.4

## 2016-01-02 MED ORDER — ACETAMINOPHEN 325 MG PO TABS
650.0000 mg | ORAL_TABLET | Freq: Four times a day (QID) | ORAL | Status: DC | PRN
Start: 2016-01-02 — End: 2016-01-05

## 2016-01-02 MED ORDER — PIPERACILLIN-TAZOBACTAM 3.375 G IVPB
3.3750 g | Freq: Three times a day (TID) | INTRAVENOUS | Status: DC
  Administered 2016-01-02 – 2016-01-05 (×8): 3.375 g via INTRAVENOUS
  Filled 2016-01-02 (×10): qty 50

## 2016-01-02 MED ORDER — FENTANYL CITRATE (PF) 250 MCG/5ML IJ SOLN
INTRAMUSCULAR | Status: DC | PRN
  Administered 2016-01-02: 100 ug via INTRAVENOUS
  Administered 2016-01-02 (×2): 50 ug via INTRAVENOUS

## 2016-01-02 MED ORDER — ACETAMINOPHEN 650 MG RE SUPP
650.0000 mg | Freq: Four times a day (QID) | RECTAL | Status: DC | PRN
Start: 2016-01-02 — End: 2016-01-05

## 2016-01-02 MED ORDER — INSULIN ASPART 100 UNIT/ML ~~LOC~~ SOLN
0.0000 [IU] | Freq: Every day | SUBCUTANEOUS | Status: DC
  Administered 2016-01-02: 2 [IU] via SUBCUTANEOUS

## 2016-01-02 MED ORDER — OXYCODONE HCL 5 MG PO TABS
5.0000 mg | ORAL_TABLET | ORAL | Status: DC | PRN
  Administered 2016-01-02 – 2016-01-05 (×8): 5 mg via ORAL
  Filled 2016-01-02 (×8): qty 1

## 2016-01-02 MED ORDER — MIDAZOLAM HCL 2 MG/2ML IJ SOLN
INTRAMUSCULAR | Status: AC
  Filled 2016-01-02: qty 2

## 2016-01-02 MED ORDER — FENTANYL CITRATE (PF) 250 MCG/5ML IJ SOLN
INTRAMUSCULAR | Status: AC
  Filled 2016-01-02: qty 5

## 2016-01-02 MED ORDER — DIPHENHYDRAMINE HCL 50 MG/ML IJ SOLN
INTRAMUSCULAR | Status: DC | PRN
  Administered 2016-01-02: 12.5 mg via INTRAVENOUS

## 2016-01-02 MED ORDER — LACTATED RINGERS IV SOLN
INTRAVENOUS | Status: DC | PRN
  Administered 2016-01-02: 16:00:00 via INTRAVENOUS

## 2016-01-02 MED ORDER — ONDANSETRON HCL 4 MG/2ML IJ SOLN
INTRAMUSCULAR | Status: DC | PRN
  Administered 2016-01-02: 4 mg via INTRAVENOUS

## 2016-01-02 MED ORDER — VANCOMYCIN HCL IN DEXTROSE 1-5 GM/200ML-% IV SOLN: 1000.0000 mg | Freq: Once | INTRAVENOUS | Status: DC

## 2016-01-02 MED ORDER — LIDOCAINE HCL (PF) 1 % IJ SOLN
30.0000 mL | Freq: Once | INTRAMUSCULAR | Status: DC
  Filled 2016-01-02: qty 30

## 2016-01-02 MED ORDER — LIDOCAINE HCL (CARDIAC) 20 MG/ML IV SOLN
INTRAVENOUS | Status: DC | PRN
  Administered 2016-01-02: 100 mg via INTRAVENOUS

## 2016-01-02 MED ORDER — SODIUM CHLORIDE 0.9 % IV BOLUS (SEPSIS)
1000.0000 mL | Freq: Once | INTRAVENOUS | Status: AC
  Administered 2016-01-02: 1000 mL via INTRAVENOUS

## 2016-01-02 MED ORDER — MORPHINE SULFATE (PF) 2 MG/ML IV SOLN
1.0000 mg | INTRAVENOUS | Status: DC | PRN
  Administered 2016-01-03 – 2016-01-04 (×3): 2 mg via INTRAVENOUS
  Filled 2016-01-02 (×3): qty 1

## 2016-01-02 MED ORDER — SUCCINYLCHOLINE CHLORIDE 20 MG/ML IJ SOLN
INTRAMUSCULAR | Status: DC | PRN
  Administered 2016-01-02: 100 mg via INTRAVENOUS

## 2016-01-02 MED ORDER — BENAZEPRIL HCL 20 MG PO TABS
20.0000 mg | ORAL_TABLET | Freq: Every day | ORAL | Status: DC
  Administered 2016-01-02 – 2016-01-05 (×4): 20 mg via ORAL
  Filled 2016-01-02 (×5): qty 1

## 2016-01-02 MED ORDER — IOPAMIDOL (ISOVUE-300) INJECTION 61%
100.0000 mL | Freq: Once | INTRAVENOUS | Status: AC | PRN
  Administered 2016-01-02: 100 mL via INTRAVENOUS

## 2016-01-02 MED ORDER — CLINDAMYCIN PHOSPHATE 600 MG/50ML IV SOLN
600.0000 mg | Freq: Once | INTRAVENOUS | Status: AC
  Administered 2016-01-02: 600 mg via INTRAVENOUS
  Filled 2016-01-02: qty 50

## 2016-01-02 MED ORDER — VANCOMYCIN HCL IN DEXTROSE 1-5 GM/200ML-% IV SOLN
1000.0000 mg | Freq: Three times a day (TID) | INTRAVENOUS | Status: DC
  Administered 2016-01-02 – 2016-01-04 (×6): 1000 mg via INTRAVENOUS
  Filled 2016-01-02 (×8): qty 200

## 2016-01-02 SURGICAL SUPPLY — 30 items
CANISTER SUCTION 2500CC (MISCELLANEOUS) ×2 IMPLANT
COVER SURGICAL LIGHT HANDLE (MISCELLANEOUS) ×2 IMPLANT
DRAPE UTILITY XL STRL (DRAPES) ×8 IMPLANT
DRSG PAD ABDOMINAL 8X10 ST (GAUZE/BANDAGES/DRESSINGS) ×2 IMPLANT
ELECT CAUTERY BLADE 6.4 (BLADE) ×2 IMPLANT
ELECT REM PT RETURN 9FT ADLT (ELECTROSURGICAL) ×2
ELECTRODE REM PT RTRN 9FT ADLT (ELECTROSURGICAL) ×1 IMPLANT
GAUZE PACKING IODOFORM 1/4X15 (GAUZE/BANDAGES/DRESSINGS) ×2 IMPLANT
GAUZE SPONGE 4X4 12PLY STRL (GAUZE/BANDAGES/DRESSINGS) ×2 IMPLANT
GLOVE BIO SURGEON STRL SZ7.5 (GLOVE) ×2 IMPLANT
GLOVE BIOGEL PI IND STRL 8 (GLOVE) ×1 IMPLANT
GLOVE BIOGEL PI INDICATOR 8 (GLOVE) ×1
GOWN STRL REUS W/ TWL LRG LVL3 (GOWN DISPOSABLE) ×1 IMPLANT
GOWN STRL REUS W/ TWL XL LVL3 (GOWN DISPOSABLE) ×1 IMPLANT
GOWN STRL REUS W/TWL LRG LVL3 (GOWN DISPOSABLE) ×1
GOWN STRL REUS W/TWL XL LVL3 (GOWN DISPOSABLE) ×1
KIT BASIN OR (CUSTOM PROCEDURE TRAY) ×2 IMPLANT
KIT ROOM TURNOVER OR (KITS) ×2 IMPLANT
NS IRRIG 1000ML POUR BTL (IV SOLUTION) ×2 IMPLANT
PACK LITHOTOMY IV (CUSTOM PROCEDURE TRAY) ×2 IMPLANT
PAD ARMBOARD 7.5X6 YLW CONV (MISCELLANEOUS) ×2 IMPLANT
PENCIL BUTTON HOLSTER BLD 10FT (ELECTRODE) ×2 IMPLANT
SPONGE LAP 18X18 X RAY DECT (DISPOSABLE) ×2 IMPLANT
SWAB COLLECTION DEVICE MRSA (MISCELLANEOUS) ×2 IMPLANT
TOWEL OR 17X24 6PK STRL BLUE (TOWEL DISPOSABLE) ×2 IMPLANT
TOWEL OR 17X26 10 PK STRL BLUE (TOWEL DISPOSABLE) ×2 IMPLANT
TUBE ANAEROBIC SPECIMEN COL (MISCELLANEOUS) ×2 IMPLANT
TUBE CONNECTING 12X1/4 (SUCTIONS) ×2 IMPLANT
UNDERPAD 30X30 INCONTINENT (UNDERPADS AND DIAPERS) ×2 IMPLANT
YANKAUER SUCT BULB TIP NO VENT (SUCTIONS) ×2 IMPLANT

## 2016-01-02 NOTE — Transfer of Care (Signed)
Immediate Anesthesia Transfer of Care Note  Patient: Carlos [name]  Procedure(s) Performed: Procedure(s): IRRIGATION AND DEBRIDEMENT PERIRECTAL ABSCESS (N/A)  Patient Location: PACU  Anesthesia Type:General  Level of Consciousness: sedated  Airway & Oxygen Therapy: Patient Spontanous Breathing and Patient connected to face mask oxygen  Post-op Assessment: Report given to RN and Post -op Vital signs reviewed and stable  Post vital signs: Reviewed and stable  Last Vitals:  Filed Vitals:   01/02/16 1300 01/02/16 1315  BP: 142/83 158/97  Pulse: 87 91  Temp:    Resp:      Last Pain:  Filed Vitals:   01/02/16 1341  PainSc: 9          Complications: No apparent anesthesia complications

## 2016-01-02 NOTE — ED Provider Notes (Signed)
CSN: [id number]     Arrival date & time 01/02/16  0935 History   First MD Initiated Contact with Patient 01/02/16 [phone]     Chief Complaint  Patient presents with  . groin abscess      (Consider location/radiation/quality/duration/timing/severity/associated sxs/prior Treatment) HPI Patient presents with perineal swelling and pain for the past 2-3 days. Denies any spontaneous discharge. Has had fatigue and chills but denies any fever. Denies abdominal pain. Has a history of diabetes and states he is compliant with his medication. He's had previous peritoneal abscess in the past. Past Medical History  Diagnosis Date  . Diabetes mellitus   . Hypertension    Past Surgical History  Procedure Laterality Date  . Femur fracture surgery      right   No family history on file. Social History  Substance Use Topics  . Smoking status: Never Smoker   . Smokeless tobacco: Never Used  . Alcohol Use: No    Review of Systems  Constitutional: Positive for chills and fatigue. Negative for fever.  Respiratory: Negative for cough and shortness of breath.   Cardiovascular: Negative for chest pain.  Gastrointestinal: Negative for nausea, vomiting, abdominal pain and diarrhea.  Genitourinary: Negative for penile pain and testicular pain.  Musculoskeletal: Negative for back pain and neck pain.  Skin: Negative for wound.  Neurological: Negative for dizziness, weakness, light-headedness, numbness and headaches.  All other systems reviewed and are negative.     Allergies  Penicillins  Home Medications   Prior to Admission medications   Medication Sig Start Date End Date Taking? Authorizing Provider  benazepril (LOTENSIN) 20 MG tablet Take 20 mg by mouth daily.    Historical Provider, MD  Cinnamon 500 MG capsule Take 500 mg by mouth daily.    Historical Provider, MD  glyBURIDE-metformin (GLUCOVANCE) 5-500 MG per tablet Take 2 tablets by mouth 2 (two) times daily with a meal.    Historical  Provider, MD   BP 158/97 mmHg  Pulse 91  Temp(Src) 99.4 F (37.4 C) (Oral)  Resp 20  SpO2 98% Physical Exam  Constitutional: He is oriented to person, place, and time. He appears well-developed and well-nourished. No distress.  HENT:  Head: Normocephalic and atraumatic.  Mouth/Throat: Oropharynx is clear and moist. No oropharyngeal exudate.  Eyes: EOM are normal. Pupils are equal, round, and reactive to light.  Neck: Normal range of motion. Neck supple.  Cardiovascular: Normal rate and regular rhythm.   Pulmonary/Chest: Effort normal and breath sounds normal.  Abdominal: Soft. Bowel sounds are normal. He exhibits no distension and no mass. There is no tenderness. There is no rebound and no guarding.  Genitourinary:  Patient with induration and mass just posterior to the scrotum and the peritoneal area. Spontaneously draining foul-smelling, green purulent liquid. Mild warmth without definite erythema  Musculoskeletal: Normal range of motion. He exhibits no edema or tenderness.  Neurological: He is alert and oriented to person, place, and time.  Moving all extremity is without deficit. Sensation is fully intact.  Skin: Skin is warm and dry. No rash noted. No erythema.  Psychiatric: He has a normal mood and affect. His behavior is normal.  Nursing note and vitals reviewed.   ED Course  Procedures (including critical care time) Labs Review Labs Reviewed  CBC WITH DIFFERENTIAL/PLATELET - Abnormal; Notable for the following:    WBC 11.3 (*)    RBC 3.96 (*)    HCT 37.4 (*)    Neutro Abs 7.9 (*)    Monocytes  Absolute 1.5 (*)    All other components within normal limits  COMPREHENSIVE METABOLIC PANEL - Abnormal; Notable for the following:    Glucose, Bld 178 (*)    ALT 14 (*)    Total Bilirubin 1.4 (*)    All other components within normal limits  CBG MONITORING, ED - Abnormal; Notable for the following:    Glucose-Capillary 182 (*)    All other components within normal limits   AEROBIC CULTURE (SUPERFICIAL SPECIMEN)  CULTURE, BLOOD (ROUTINE X 2)  CULTURE, BLOOD (ROUTINE X 2)  CK  LACTIC ACID, PLASMA  PROCALCITONIN  LACTIC ACID, PLASMA  HEMOGLOBIN A1C    Imaging Review Ct Pelvis W Contrast  01/02/2016  CLINICAL DATA:  Boil in middle of buttocks for 3 days, ruptured today. EXAM: CT PELVIS WITH CONTRAST TECHNIQUE: Multidetector CT imaging of the pelvis was performed using the standard protocol following the bolus administration of intravenous contrast. CONTRAST:  ISOVUE-300 IOPAMIDOL (ISOVUE-300) INJECTION 61% COMPARISON:  None. FINDINGS: Ill-defined edema within the subcutaneous soft tissues on either side of the gluteal fold. Phlegmonous like soft tissue thickening also noted along the gluteal fold. No circumscribed abscess collection seen. Intrapelvic soft tissues are unremarkable. No intrapelvic abscess or free fluid. Pelvic portion of the bowel appears normal. No free intraperitoneal air. No acute- appearing osseous abnormality. IMPRESSION: Ill-defined edema within the subcutaneous soft tissues on either side of the gluteal fold. Additional phlegmonous-like soft tissue thickening along the left lateral aspect of the gluteal fold. No circumscribed fluid collection or abscess-like collection seen. No intrapelvic abscess collection or free fluid. Visualized portions of the bowel appear normal. Electronically Signed   By: Bary Richard M.D.   On: 01/02/2016 12:37   US Scrotum  01/02/2016  CLINICAL DATA:  Scrotal abscess. EXAM: ULTRASOUND OF SCROTUM TECHNIQUE: Complete ultrasound examination of the testicles, epididymis, and other scrotal structures was performed. COMPARISON:  CT abdomen pelvis 01/02/2016. FINDINGS: Right testicle Measurements: 4.5 x 2.7 x 3.0 cm. No mass or microlithiasis. Color Doppler flow is identified. Left testicle Measurements: 4.1 x 2.3 x 3.0 cm. No mass or microlithiasis. Color Doppler flow is identified. Right epididymis:  Normal in size and  appearance. Left epididymis:  8 mm anechoic epididymal cyst or spermatocele. Hydrocele:  Small bilateral. Varicocele:  None visualized. Other: Scrotal and perineal soft tissue thickening with a complex fluid collection along the right perineum, measuring approximately 3.4 x 1.5 x 1.9 cm. IMPRESSION: 1. Complex scrotal/perineal fluid collection is indicative of an abscess. 2. Small bilateral hydroceles. Electronically Signed   By: Leanna Battles M.D.   On: 01/02/2016 14:24   I have personally reviewed and evaluated these images and lab results as part of my medical decision-making.   EKG Interpretation None      MDM   Final diagnoses:  Scrotal abscess    CT pelvis without fluid collection but does show soft tissue swelling. Given IV clindamycin in the emergency department. Discussed with [facility] who evaluated the patient. Ordered ultrasound of the scrotum which demonstrated a complex abscess. I discussed with Dr. Derrell Lolling who will see the patient in the emergency department and take the operating room for I&D.    Loren Racer, MD 01/16/16 [phone]

## 2016-01-02 NOTE — Care Management Note (Addendum)
Case Management Note  Patient Details  Name: Carlos [name] MRN: [id number] Date of Birth: 04/30/1976  Subjective/Objective:  40 y.o. M seen in the ED who has a Boil on his buttocks which will require antibiotics. CM contacted by EDP, as pt would rather not be admitted to the hospital.  MD at bedside explaining that now surgeon has been consulted as there are concerns for abscesses. CM has provided MATCH as pt has no Insurance or ability to pay for expensive medications. MD anticipates Clindamycin ($30.41) Bactrim  ($4.00). Also has medications prior to admission:  Lantus Insulin Pins ($266.00) Lotensin ($4.00) Glyburide-Metformin ($15.28).  Will continue to follow for continued CM needs.                    Action/Plan: Discharge with MATCH if no IP needs.    Expected Discharge Date:                  Expected Discharge Plan:     In-House Referral:     Discharge planning Services  CM Consult, MATCH Program, Medication Assistance  Post Acute Care Choice:    Choice offered to:     DME Arranged:    DME Agency:     HH Arranged:    HH Agency:     Status of Service:  Completed, signed off  If discussed at [employer] of Stay Meetings, dates discussed:    Additional Comments:  Yvone Neu, RN 01/02/2016, 2:48 PM

## 2016-01-02 NOTE — Anesthesia Preprocedure Evaluation (Addendum)
Anesthesia Evaluation  Patient identified by MRN, date of birth, ID band Patient awake    Reviewed: Allergy & Precautions, NPO status , Patient's Chart, lab work & pertinent test results  Airway Mallampati: I  TM Distance: >3 FB Neck ROM: Full    Dental  (+) Teeth Intact, Dental Advisory Given   Pulmonary    breath sounds clear to auscultation       Cardiovascular hypertension,  Rhythm:Regular Rate:Normal     Neuro/Psych negative neurological ROS     GI/Hepatic (+)     substance abuse  marijuana use,   Endo/Other  diabetes, Poorly Controlled  Renal/GU      Musculoskeletal   Abdominal   Peds  Hematology negative hematology ROS (+)   Anesthesia Other Findings   Reproductive/Obstetrics                           Anesthesia Physical Anesthesia Plan  ASA: II  Anesthesia Plan: General   Post-op Pain Management:    Induction: Intravenous  Airway Management Planned: Oral ETT  Additional Equipment:   Intra-op Plan:   Post-operative Plan: Extubation in OR  Informed Consent: I have reviewed the patients History and Physical, chart, labs and discussed the procedure including the risks, benefits and alternatives for the proposed anesthesia with the patient or authorized representative who has indicated his/her understanding and acceptance.   Dental advisory given  Plan Discussed with: CRNA, Anesthesiologist and Surgeon  Anesthesia Plan Comments:       Anesthesia Quick Evaluation

## 2016-01-02 NOTE — Anesthesia Procedure Notes (Signed)
Procedure Name: Intubation Date/Time: 01/02/2016 4:27 PM Performed by: Brien Mates D Pre-anesthesia Checklist: Patient identified, Emergency Drugs available, Suction available, Patient being monitored and Timeout performed Patient Re-evaluated:Patient Re-evaluated prior to inductionOxygen Delivery Method: Circle system utilized Preoxygenation: Pre-oxygenation with 100% oxygen Intubation Type: IV induction, Rapid sequence and Cricoid Pressure applied Ventilation: Mask ventilation without difficulty Laryngoscope Size: Miller and 2 Grade View: Grade I Tube type: Oral Tube size: 7.5 mm Number of attempts: 1 Airway Equipment and Method: Stylet Placement Confirmation: ETT inserted through vocal cords under direct vision,  positive ETCO2 and breath sounds checked- equal and bilateral Secured at: 22 cm Tube secured with: Tape Dental Injury: Teeth and Oropharynx as per pre-operative assessment

## 2016-01-02 NOTE — Progress Notes (Signed)
Pharmacy Antibiotic Note  Ambrose Klepacki is a 40 y.o. male admitted on 01/02/2016 with cellulitis.  Pharmacy has been consulted for vancomycin and Zosyn dosing.  Patient is s/p I&D for perineal abscess on 7/22.  Received clindamycin 600mg  IV x1 at ~1045 and vancomycin 1g at ~1615.  Plan: -Zosyn 3.375g IV q8h EI -Vancomycin 1g IV q8h starting at 2200. Goal trough 15-20mg /mL with abscess -Follow c/s, clinical progression, renal function  Weight: 207 lb 14.3 oz (94.3 kg)  Temp (24hrs), Avg:98.5 F (36.9 C), Min:98 F (36.7 C), Max:99.4 F (37.4 C)   Recent Labs Lab 01/02/16 1050 01/02/16 1355  WBC 11.3*  --   CREATININE 0.88  --   LATICACIDVEN  --  0.8    Estimated Creatinine Clearance: 128.4 mL/min (by C-G formula based on Cr of 0.88).    Allergies  Allergen Reactions  . Penicillins Other (See Comments)    Childhood Reaction, Has patient had a PCN reaction causing immediate rash, facial/tongue/throat swelling, SOB or lightheadedness with hypotension: Unknown Has patient had a PCN reaction causing severe rash involving mucus membranes or skin necrosis: No Has patient had a PCN reaction that required hospitalization No Has patient had a PCN reaction occurring within the last 10 years: No If all of the above answers are "NO", then may proceed with Ce    Antimicrobials this admission: Clindamycin 7/22 x1 Zosyn 7/22>> Vancomycin 7/22>>  Dose adjustments this admission: N/a  Microbiology results: 7/22 surgical abscess: sent 7/22 BCx: sent 7/22 groin abscess: few GPR, abundant GNB, abundant GPC   Thank you for allowing pharmacy to be a part of this patient's care.  Koryn Charlot 01/02/2016 6:00 PM

## 2016-01-02 NOTE — Op Note (Signed)
01/02/2016  4:34 PM  PATIENT:  Carlos [name]  40 y.o. male  PRE-OPERATIVE DIAGNOSIS:  Perineal abscess  POST-OPERATIVE DIAGNOSIS:  perineal abscess  PROCEDURE:  Procedure(s): IRRIGATION AND DEBRIDEMENT PERINEALABSCESS (N/A)  SURGEON:  Surgeon(s) and Role:    * Axel Filler, MD - Primary   ANESTHESIA:   general  EBL:  Total I/O In: 1300 [I.V.:1300] Out: -   BLOOD ADMINISTERED:none  DRAINS: Quarter-inch iodoform gauze within wound   LOCAL MEDICATIONS USED:  NONE  SPECIMEN:  Source of Specimen:  Perineal abscess  DISPOSITION OF SPECIMEN:  Microbiology  COUNTS:  YES  TOURNIQUET:  * No tourniquets in log *  DICTATION: .Dragon Dictation Indications for procedure: Patient is a 40 year old male with 40 history of a perineal abscess. Patient had an ultrasound which revealed a complex approximately 2 x 3 cm abscess. Surgery was consulted for further management.   Details of procedure the patient was consented patient was taken to the operating room placed in supine position bilateral testes in place. No intertrochanteric anesthesia. Placed in lithotomy position. His prepped and draped in the standard fashion. Timeout was called all facts verified.  A 2 cm elliptical incision was made over the area of the punctate opening and the area of greatest fluctuance. There is large amount of purulence expressed. Anaerobic and aerobic cultures were obtained. Elliptical incision was made. Dissection was used to break up any pus pocket. There is sterile saline. Hemostasis was achieved using cautery. The area was then packed with 1/4 inch iodoform gauze. This was dressed with 4 x 4's, AVD pad, and mesh panties. The patient tolerated the procedure well. He was taken to the recovery room stable condition.  PLAN OF CARE: Admit to inpatient   PATIENT DISPOSITION:  PACU - hemodynamically stable.   Delay start of Pharmacological VTE agent (>24hrs) due to surgical blood loss or risk of  bleeding: not applicable

## 2016-01-02 NOTE — Consult Note (Signed)
Reason for Consult:perineal abscess Referring Physician: Dr. Wayne Sever is an 40 y.o. male.  HPI: 41 y/o M with 4d h/o perineal pain and abscess.  Pt states pain con't to increase and presented to ED.  In ED pt underwent CT which showed cellulitis.  PT also underwent US which revealed: IMPRESSION: 1. Complex scrotal/perineal fluid 3.4 x 1.5 x 1.9 cm. collection is indicative of an abscess. 2. Small bilateral hydroceles.    Past Medical History  Diagnosis Date  . Diabetes mellitus   . Hypertension     Past Surgical History  Procedure Laterality Date  . Femur fracture surgery      right    No family history on file.  Social History:  reports that he has never smoked. He has never used smokeless tobacco. He reports that he uses illicit drugs (Marijuana). He reports that he does not drink alcohol.  Allergies:  Allergies  Allergen Reactions  . Penicillins Other (See Comments)    Childhood Reaction, Has patient had a PCN reaction causing immediate rash, facial/tongue/throat swelling, SOB or lightheadedness with hypotension: No Has patient had a PCN reaction causing severe rash involving mucus membranes or skin necrosis: No Has patient had a PCN reaction that required hospitalization No Has patient had a PCN reaction occurring within the last 10 years: No If all of the above answers are "NO", then may proceed with Ce    Medications: I have reviewed the patient's current medications.  Results for orders placed or performed during the hospital encounter of 01/02/16 (from the past 48 hour(s))  CBG monitoring, ED     Status: Abnormal   Collection Time: 01/02/16  9:41 AM  Result Value Ref Range   Glucose-Capillary 182 (H) 65 - 99 mg/dL   Comment 1 Notify RN    Comment 2 Document in Chart   Wound or Superficial Culture     Status: None (Preliminary result)   Collection Time: 01/02/16 10:11 AM  Result Value Ref Range   Specimen Description ABSCESS GROIN    Special  Requests Normal    Gram Stain      ABUNDANT WBC PRESENT, PREDOMINANTLY PMN ABUNDANT GRAM POSITIVE COCCI IN PAIRS AND CHAINS ABUNDANT GRAM NEGATIVE COCCOBACILLI FEW GRAM POSITIVE RODS Gram Stain Report Called to,Read Back By and Verified With: H HALL,RN AT 1139 01/02/16 BY L BENFIELD    Culture PENDING    Report Status PENDING   CBC with Differential/Platelet     Status: Abnormal   Collection Time: 01/02/16 10:50 AM  Result Value Ref Range   WBC 11.3 (H) 4.0 - 10.5 K/uL   RBC 3.96 (L) 4.22 - 5.81 MIL/uL   Hemoglobin 13.3 13.0 - 17.0 g/dL   HCT 37.4 (L) 39.0 - 52.0 %   MCV 94.4 78.0 - 100.0 fL   MCH 33.6 26.0 - 34.0 pg   MCHC 35.6 30.0 - 36.0 g/dL   RDW 13.0 11.5 - 15.5 %   Platelets 250 150 - 400 K/uL   Neutrophils Relative % 70 %   Lymphocytes Relative 16 %   Monocytes Relative 13 %   Eosinophils Relative 1 %   Basophils Relative 0 %   Neutro Abs 7.9 (H) 1.7 - 7.7 K/uL   Lymphs Abs 1.8 0.7 - 4.0 K/uL   Monocytes Absolute 1.5 (H) 0.1 - 1.0 K/uL   Eosinophils Absolute 0.1 0.0 - 0.7 K/uL   Basophils Absolute 0.0 0.0 - 0.1 K/uL   Smear Review MORPHOLOGY UNREMARKABLE  Comprehensive metabolic panel     Status: Abnormal   Collection Time: 01/02/16 10:50 AM  Result Value Ref Range   Sodium 137 135 - 145 mmol/L   Potassium 3.5 3.5 - 5.1 mmol/L   Chloride 105 101 - 111 mmol/L   CO2 23 22 - 32 mmol/L   Glucose, Bld 178 (H) 65 - 99 mg/dL   BUN 9 6 - 20 mg/dL   Creatinine, Ser 0.88 0.61 - 1.24 mg/dL   Calcium 9.3 8.9 - 10.3 mg/dL   Total Protein 7.2 6.5 - 8.1 g/dL   Albumin 4.1 3.5 - 5.0 g/dL   AST 19 15 - 41 U/L   ALT 14 (L) 17 - 63 U/L   Alkaline Phosphatase 68 38 - 126 U/L   Total Bilirubin 1.4 (H) 0.3 - 1.2 mg/dL   GFR calc non Af Amer >60 >60 mL/min   GFR calc Af Amer >60 >60 mL/min    Comment: (NOTE) The eGFR has been calculated using the CKD EPI equation. This calculation has not been validated in all clinical situations. eGFR's persistently <60 mL/min signify  possible Chronic Kidney Disease.    Anion gap 9 5 - 15  CK     Status: None   Collection Time: 01/02/16  1:55 PM  Result Value Ref Range   Total CK 225 49 - 397 U/L  Lactic acid, plasma     Status: None   Collection Time: 01/02/16  1:55 PM  Result Value Ref Range   Lactic Acid, Venous 0.8 0.5 - 1.9 mmol/L    Ct Pelvis W Contrast  01/02/2016  CLINICAL DATA:  Boil in middle of buttocks for 3 days, ruptured today. EXAM: CT PELVIS WITH CONTRAST TECHNIQUE: Multidetector CT imaging of the pelvis was performed using the standard protocol following the bolus administration of intravenous contrast. CONTRAST:  134m ISOVUE-300 IOPAMIDOL (ISOVUE-300) INJECTION 61% COMPARISON:  None. FINDINGS: Ill-defined edema within the subcutaneous soft tissues on either side of the gluteal fold. Phlegmonous like soft tissue thickening also noted along the gluteal fold. No circumscribed abscess collection seen. Intrapelvic soft tissues are unremarkable. No intrapelvic abscess or free fluid. Pelvic portion of the bowel appears normal. No free intraperitoneal air. No acute- appearing osseous abnormality. IMPRESSION: Ill-defined edema within the subcutaneous soft tissues on either side of the gluteal fold. Additional phlegmonous-like soft tissue thickening along the left lateral aspect of the gluteal fold. No circumscribed fluid collection or abscess-like collection seen. No intrapelvic abscess collection or free fluid. Visualized portions of the bowel appear normal. Electronically Signed   By: SFranki CabotM.D.   On: 01/02/2016 12:37   [place]Scrotum  01/02/2016  CLINICAL DATA:  Scrotal abscess. EXAM: ULTRASOUND OF SCROTUM TECHNIQUE: Complete ultrasound examination of the testicles, epididymis, and other scrotal structures was performed. COMPARISON:  CT abdomen pelvis 01/02/2016. FINDINGS: Right testicle Measurements: 4.5 x 2.7 x 3.0 cm. No mass or microlithiasis. Color Doppler flow is identified. Left testicle Measurements: 4.1  x 2.3 x 3.0 cm. No mass or microlithiasis. Color Doppler flow is identified. Right epididymis:  Normal in size and appearance. Left epididymis:  8 mm anechoic epididymal cyst or spermatocele. Hydrocele:  Small bilateral. Varicocele:  None visualized. Other: Scrotal and perineal soft tissue thickening with a complex fluid collection along the right perineum, measuring approximately 3.4 x 1.5 x 1.9 cm. IMPRESSION: 1. Complex scrotal/perineal fluid collection is indicative of an abscess. 2. Small bilateral hydroceles. Electronically Signed   By: MLorin PicketM.D.   On:  01/02/2016 14:24    Review of Systems  Constitutional: Positive for chills. Negative for fever and weight loss.  HENT: Negative for hearing loss and tinnitus.   Eyes: Negative for blurred vision, double vision, photophobia and pain.  Respiratory: Negative for cough, hemoptysis and sputum production.   Cardiovascular: Negative for chest pain, palpitations and orthopnea.  Gastrointestinal: Negative for heartburn, nausea and vomiting.  Genitourinary: Negative for dysuria, urgency and frequency.       Pernieal pain  Musculoskeletal: Negative for myalgias, back pain and neck pain.  Skin: Negative for itching and rash.  Neurological: Negative for dizziness, tremors and headaches.   Blood pressure 158/97, pulse 91, temperature 99.4 F (37.4 C), temperature source Oral, resp. rate 20, SpO2 98 %. Physical Exam  Constitutional: He is oriented to person, place, and time. He appears well-developed and well-nourished.  HENT:  Head: Normocephalic and atraumatic.  Eyes: Conjunctivae and EOM are normal. Pupils are equal, round, and reactive to light.  Neck: Normal range of motion. Neck supple.  Cardiovascular: Normal rate, regular rhythm, normal heart sounds and intact distal pulses.  Exam reveals no gallop and no friction rub.   No murmur heard. Respiratory: Effort normal and breath sounds normal. No respiratory distress. He has no wheezes.  He has no rales.  GI: Soft. Bowel sounds are normal. He exhibits no distension. There is no tenderness. There is no rebound and no guarding.  Genitourinary:     Musculoskeletal: Normal range of motion. He exhibits no edema.  Neurological: He is alert and oriented to person, place, and time.  Skin: Skin is warm and dry.    Assessment/Plan: 40 y/o M with perineal abscess Principal Problem:   Scrotal abscess Active Problems:   Perineal abscess   Diabetes mellitus type 2, insulin dependent (HCC)   HTN (hypertension)   Does not have health insurance   Tobacco abuse   -To OR for I&D of abscess -con't abx -Medicine to admit -I d/w the pt the risks and benefits of the procedure to include but not limited to: bleeding, damage to surrounding structure, possible need for further surgery.  Pt voiced understanding and wishes to proceed to the OR.  Rosario Jacks., Alizzon Dioguardi 01/02/2016, 3:15 PM

## 2016-01-02 NOTE — Progress Notes (Addendum)
Interviewed and examined patient with attending physician. Patient at this point wishes to not be admitted to the hospital. Discussed risks vs benefits of outpatient treatment and expectations. Patient informed that final decision regarding whether he is appropriate to discharge home on oral antibiotics is pending results of scrotal ultrasound. Patient is agreeable to this plan. Case management also contacted by attending physician to assist patient in obtaining appropriate follow-up and antibiotics if discharged from the ER. If scrotal ultrasound does not show significant findings and patient is discharged from the ER will need to discuss with Dr.Yelverton/EDP since to appropriately discharge patient from ER this needs to be accomplished under the auspices of the ER. If patient discharged we will complete a formal consultation note.  1434: Scrotal ultrasound has returned back with finding of complex scrotal/perineal fluid collection indicative of an abscess measuring 3.4 x 1.5 x 1.9 cm. This was discussed at length per my attending Dr. Montez Morita with EDP attending Dr. Ranae Palms with our request that since the patient does not wish to stay in the hospital that an opinion from a urological physician needs to be obtained prior to considering allowing patient to go home on oral antibiotics. Dr. Ranae Palms agreed and will obtain consultation. Again, if patient discharged from the ER we will place a formal internal medicine consultation note, alternately if patient does agree to come in to the hospital for observation or if urology recommends surgical procedure we will provide the H&P.  Junious Silk, ANP

## 2016-01-02 NOTE — ED Notes (Signed)
Carlos Coma, MS gave pt.s personal belonging's  key to Short Stay RN.

## 2016-01-02 NOTE — ED Notes (Signed)
Patient here with abscess to groin area for several days, history of same. Denies drainage, pain with sitting

## 2016-01-02 NOTE — H&P (Signed)
History and physical   Carlos [name] JIR:[id number] DOB: [birth year] DOA: 01/02/2016   PCP: Augustine Radar, MD -states no longer has primary care provider x 2 weeks  Patient coming from/Resides with: Private residence/lives with wife  Chief Complaint: Diabetic with perineal and scrotal abscess  HPI: Carlos [name] is a 40 y.o. male with medical history significant for diabetes on insulin, hypertension and ongoing tobacco abuse who presents to the ER to having 4 days of an evolving small boil on his perineum just below the scrotum. He is attempted to soak in the tub without relief of his symptoms. He has had prior staphylococcal infections but is never been severe enough to require antibiotics. He does shave his genitals frequently. He has never had any fevers or chills since onset of symptoms; he reports his CBGs have been between 170 and 200 typically even after development of the boil  ED Course:  Vital signs: 99.4-170/105-125-20-RA 100% Repeat vital signs: 150/97-91-18 CT of the pelvis with contrast: Showed an ill defined area of edema within the subcutaneous soft tissues on either side of the gluteal fold with additional phlegmonous-like soft tissue thickening along the left lateral aspect of the gluteal fold without any circumscribed fluid collection or abscess-like collection seen Lab data: Sodium 137, potassium 3.5, CO2 23, BUN 9, creatinine 0.88, glucose 178, ALT 14, total bilirubin 1.4, white count 11,300 with normal differential except for mildly elevated absolute neutrophils of 7.9%, hemoglobin 13.3, platelets 250,000 Medications and treatments: Normal saline bolus 1 L, clindamycin 600 mg IV 1  Review of Systems:  In addition to the HPI above,  No Fever-chills, myalgias or other constitutional symptoms No Headache, changes with Vision or hearing, new weakness, tingling, numbness in any extremity, No problems swallowing food or Liquids, indigestion/reflux No Chest pain, Cough  or Shortness of Breath, palpitations, orthopnea or DOE No Abdominal pain, N/V; no melena or hematochezia, no dark tarry stools, Bowel movements are regular, No dysuria, hematuria or flank pain No new skin rashes, masses or bruises, No new joints pains-aches No recent weight gain or loss No polyuria, polydypsia or polyphagia,   Past Medical History  Diagnosis Date  . Diabetes mellitus   . Hypertension     Past Surgical History  Procedure Laterality Date  . Femur fracture surgery      right    Social History   Social History  . Marital Status: Married    Spouse Name: N/A  . Number of Children: N/A  . Years of Education: N/A   Occupational History  . Not on file.   Social History Main Topics  . Smoking status: Never Smoker   . Smokeless tobacco: Never Used  . Alcohol Use: No  . Drug Use: Yes    Special: Marijuana  . Sexual Activity: Not on file   Other Topics Concern  . Not on file   Social History Narrative    Mobility: Without assistive devices Work history: Unemployed   Allergies  Allergen Reactions  . Penicillins Other (See Comments)    Childhood Reaction.    Family history reviewed:Patient's father and paternal grandfather both have diabetes  Prior to Admission medications   Medication Sig Start Date End Date Taking? Authorizing Provider  benazepril (LOTENSIN) 20 MG tablet Take 20 mg by mouth daily.    Historical Provider, MD  Cinnamon 500 MG capsule Take 500 mg by mouth daily.    Historical Provider, MD  Lantus insulin  16 units daily     Historical Provider, MD  Physical Exam: Filed Vitals:   01/02/16 1215 01/02/16 1245 01/02/16 1300 01/02/16 1315  BP: 169/94 146/86 142/83 158/97  Pulse: 95 87 87 91  Temp:      TempSrc:      Resp:      SpO2: 99% 96% 96% 98%      Constitutional: NAD, calm, comfortable Eyes: PERRL, lids and conjunctivae normal ENMT: Mucous membranes are moist. Posterior pharynx clear of any exudate or lesions.Normal  dentition.  Neck: normal, supple, no masses, no thyromegaly Respiratory: clear to auscultation bilaterally, no wheezing, no crackles. Normal respiratory effort. No accessory muscle use.  Cardiovascular: Regular rate and rhythm, no murmurs / rubs / gallops. No extremity edema. 2+ pedal pulses. No carotid bruits.  Abdomen: no tenderness, no masses palpated. No hepatosplenomegaly. Bowel sounds positive.  Musculoskeletal: no clubbing / cyanosis. No joint deformity upper and lower extremities. Good ROM, no contractures. Normal muscle tone.  Skin: Normal in appearance with good turgor. Does have an area between the rectum and the base of the scrotum that is ulcerated and open secondary to recent I/D in the ER-areas associated with an upward tracking induration of about 5 cm estimated length with about a 3-4 cm width-no erythema and unable to express fluid with palpation although noticed serosanguineous fluid on bedding Neurologic: CN 2-12 grossly intact. Sensation intact, DTR normal. Strength 5/5 x all 4 extremities.  Psychiatric: Normal judgment and insight. Alert and oriented x 3. Normal mood.    Labs on Admission: I have personally reviewed following labs and imaging studies  CBC:  Recent Labs Lab 01/02/16 1050  WBC 11.3*  NEUTROABS 7.9*  HGB 13.3  HCT 37.4*  MCV 94.4  PLT 250   Basic Metabolic Panel:  Recent Labs Lab 01/02/16 1050  NA 137  K 3.5  CL 105  CO2 23  GLUCOSE 178*  BUN 9  CREATININE 0.88  CALCIUM 9.3   GFR: CrCl cannot be calculated (Unknown ideal weight.). Liver Function Tests:  Recent Labs Lab 01/02/16 1050  AST 19  ALT 14*  ALKPHOS 68  BILITOT 1.4*  PROT 7.2  ALBUMIN 4.1   No results for input(s): LIPASE, AMYLASE in the last 168 hours. No results for input(s): AMMONIA in the last 168 hours. Coagulation Profile: No results for input(s): INR, PROTIME in the last 168 hours. Cardiac Enzymes:  Recent Labs Lab 01/02/16 1355  CKTOTAL 225   BNP  (last 3 results) No results for input(s): PROBNP in the last 8760 hours. HbA1C: No results for input(s): HGBA1C in the last 72 hours. CBG:  Recent Labs Lab 01/02/16 0941  GLUCAP 182*   Lipid Profile: No results for input(s): CHOL, HDL, LDLCALC, TRIG, CHOLHDL, LDLDIRECT in the last 72 hours. Thyroid Function Tests: No results for input(s): TSH, T4TOTAL, FREET4, T3FREE, THYROIDAB in the last 72 hours. Anemia Panel: No results for input(s): VITAMINB12, FOLATE, FERRITIN, TIBC, IRON, RETICCTPCT in the last 72 hours. Urine analysis:    Component Value Date/Time   COLORURINE AMBER* 12/09/2015 1055   APPEARANCEUR CLEAR 12/09/2015 1055   LABSPEC 1.028 12/09/2015 1055   PHURINE 6.0 12/09/2015 1055   GLUCOSEU >1000* 12/09/2015 1055   HGBUR NEGATIVE 12/09/2015 1055   BILIRUBINUR SMALL* 12/09/2015 1055   KETONESUR 40* 12/09/2015 1055   PROTEINUR NEGATIVE 12/09/2015 1055   UROBILINOGEN 0.2 11/01/2012 1115   NITRITE NEGATIVE 12/09/2015 1055   LEUKOCYTESUR NEGATIVE 12/09/2015 1055   Sepsis Labs: (procalcitonin:4,lacticidven:4) ) Recent Results (from the past 240 hour(s))  Wound or Superficial Culture  Status: None (Preliminary result)   Collection Time: 01/02/16 10:11 AM  Result Value Ref Range Status   Specimen Description ABSCESS GROIN  Final   Special Requests Normal  Final   Gram Stain   Final    ABUNDANT WBC PRESENT, PREDOMINANTLY PMN ABUNDANT GRAM POSITIVE COCCI IN PAIRS AND CHAINS ABUNDANT GRAM NEGATIVE COCCOBACILLI FEW GRAM POSITIVE RODS Gram Stain Report Called to,Read Back By and Verified With: H HALL,RN AT 1139 01/02/16 BY L BENFIELD    Culture PENDING  Incomplete   Report Status PENDING  Incomplete     Radiological Exams on Admission: Ct Pelvis W Contrast  01/02/2016  CLINICAL DATA:  Boil in middle of buttocks for 3 days, ruptured today. EXAM: CT PELVIS WITH CONTRAST TECHNIQUE: Multidetector CT imaging of the pelvis was performed using the standard  protocol following the bolus administration of intravenous contrast. CONTRAST:  ISOVUE-300 IOPAMIDOL (ISOVUE-300) INJECTION 61% COMPARISON:  None. FINDINGS: Ill-defined edema within the subcutaneous soft tissues on either side of the gluteal fold. Phlegmonous like soft tissue thickening also noted along the gluteal fold. No circumscribed abscess collection seen. Intrapelvic soft tissues are unremarkable. No intrapelvic abscess or free fluid. Pelvic portion of the bowel appears normal. No free intraperitoneal air. No acute- appearing osseous abnormality. IMPRESSION: Ill-defined edema within the subcutaneous soft tissues on either side of the gluteal fold. Additional phlegmonous-like soft tissue thickening along the left lateral aspect of the gluteal fold. No circumscribed fluid collection or abscess-like collection seen. No intrapelvic abscess collection or free fluid. Visualized portions of the bowel appear normal. Electronically Signed   By: Bary Richard M.D.   On: 01/02/2016 12:37   US Scrotum  01/02/2016  CLINICAL DATA:  Scrotal abscess. EXAM: ULTRASOUND OF SCROTUM TECHNIQUE: Complete ultrasound examination of the testicles, epididymis, and other scrotal structures was performed. COMPARISON:  CT abdomen pelvis 01/02/2016. FINDINGS: Right testicle Measurements: 4.5 x 2.7 x 3.0 cm. No mass or microlithiasis. Color Doppler flow is identified. Left testicle Measurements: 4.1 x 2.3 x 3.0 cm. No mass or microlithiasis. Color Doppler flow is identified. Right epididymis:  Normal in size and appearance. Left epididymis:  8 mm anechoic epididymal cyst or spermatocele. Hydrocele:  Small bilateral. Varicocele:  None visualized. Other: Scrotal and perineal soft tissue thickening with a complex fluid collection along the right perineum, measuring approximately 3.4 x 1.5 x 1.9 cm. IMPRESSION: 1. Complex scrotal/perineal fluid collection is indicative of an abscess. 2. Small bilateral hydroceles. Electronically Signed    By: Leanna Battles M.D.   On: 01/02/2016 14:24     Assessment/Plan Principal Problem:   Scrotal abscess/Perineal abscess -Diabetic patient on insulin presents with 4 days of skin changes in the perineum described as "boil" status post bedside I/D by EDP -We have requested scrotal ultrasound to better clarify this area ** Scrotal ultrasound revealed a complex scrotal/perineal fluid collection indicative of abscess measuring 3.4 x 1.5 x 1.9 cm -CK 225, lactic acid 0.8, Procalcitonin less than 0.10 -Initially the patient refused admission because of lack of insurance but was made aware of the abnormal findings on the scrotal ultrasound and plans for General Surgery physician to review ultrasound and examine the patient-he is willing to be evaluated by surgery and comply with recommendations  **Surgery recommending intraoperative I&D and patient agrees to this procedure and is willing to be admitted -Cellulitis order set initiated and we will utilize IV vancomycin until cultures returned -Check HIV -Check Blood cultures -FU on ER and intra op wound cx's  Active  Problems:   Diabetes mellitus type 2, insulin dependent  -Current CBGs greater than 150 -Continue Lantus 16 units daily; patient reports was obtaining Lantus samples from PCP-I explained to the patient we could give him a prescription but this medication is expensive out of pocket and we do not have samples available therefore I will ask inpatient case management to evaluate the patient regarding medication assistance/coupon for Lantus versus transitioning to a more affordable insulin such as 70/30 -Provide sliding scale insulin -Check hemoglobin A1c    HTN (hypertension) -Moderately controlled -Continue preadmission Benazepril    Does not have health insurance -Case management consultation to assist patient in establishing with PCP    Tobacco abuse -Smokes less than a pack of cigarettes per week     DVT prophylaxis: SCDs  preoperatively ** OR I/D done- OK per op note to immediately begin pharmacological DVT prophylaxis so order written Code Status: Full Code Family Communication: No family at bedside at time of admission Disposition Plan: Anticipate discharge back to preadmission home environment once medically stable Consults called: Gen. Surgery/Ramirez Admission status: Observation/surgical floor     ELLIS,ALLISON L. ANP-BC [facility] Pager [phone]   If 7PM-7AM, please contact night-coverage [email or web address] Password [facility]  01/02/2016, 2:45 PM

## 2016-01-02 NOTE — Anesthesia Postprocedure Evaluation (Signed)
Anesthesia Post Note  Patient: Carlos [name]  Procedure(s) Performed: Procedure(s) (LRB): IRRIGATION AND DEBRIDEMENT PERIRECTAL ABSCESS (N/A)  Patient location during evaluation: PACU Anesthesia Type: General Level of consciousness: awake and alert Pain management: pain level controlled Vital Signs Assessment: post-procedure vital signs reviewed and stable Respiratory status: spontaneous breathing, nonlabored ventilation, respiratory function stable and patient connected to nasal cannula oxygen Cardiovascular status: blood pressure returned to baseline and stable Postop Assessment: no signs of nausea or vomiting Anesthetic complications: no    Last Vitals:  Filed Vitals:   01/02/16 1849 01/02/16 2017  BP: 154/86 153/87  Pulse: 90 94  Temp: 38.1 C 37.4 C  Resp: 18 19    Last Pain:  Filed Vitals:   01/02/16 2322  PainSc: 2                  Nayden Czajka,JAMES TERRILL

## 2016-01-03 ENCOUNTER — Encounter (HOSPITAL_COMMUNITY): Payer: Self-pay | Admitting: General Surgery

## 2016-01-03 DIAGNOSIS — L02215 Cutaneous abscess of perineum: Principal | ICD-10-CM

## 2016-01-03 DIAGNOSIS — Z720 Tobacco use: Secondary | ICD-10-CM

## 2016-01-03 DIAGNOSIS — I10 Essential (primary) hypertension: Secondary | ICD-10-CM

## 2016-01-03 DIAGNOSIS — N492 Inflammatory disorders of scrotum: Secondary | ICD-10-CM

## 2016-01-03 LAB — CBC
HCT: 37.4 % — ABNORMAL LOW (ref 39.0–52.0)
Hemoglobin: 12.9 g/dL — ABNORMAL LOW (ref 13.0–17.0)
MCH: 32.8 pg (ref 26.0–34.0)
MCHC: 34.5 g/dL (ref 30.0–36.0)
MCV: 95.2 fL (ref 78.0–100.0)
PLATELETS: 237 10*3/uL (ref 150–400)
RBC: 3.93 MIL/uL — ABNORMAL LOW (ref 4.22–5.81)
RDW: 12.9 % (ref 11.5–15.5)
WBC: 11.6 10*3/uL — AB (ref 4.0–10.5)

## 2016-01-03 LAB — GLUCOSE, CAPILLARY
GLUCOSE-CAPILLARY: 177 mg/dL — AB (ref 65–99)
GLUCOSE-CAPILLARY: 205 mg/dL — AB (ref 65–99)
GLUCOSE-CAPILLARY: 173 mg/dL — AB (ref 65–99)
Glucose-Capillary: 225 mg/dL — ABNORMAL HIGH (ref 65–99)

## 2016-01-03 LAB — BASIC METABOLIC PANEL
ANION GAP: 6 (ref 5–15)
BUN: 8 mg/dL (ref 6–20)
CALCIUM: 8.9 mg/dL (ref 8.9–10.3)
CO2: 28 mmol/L (ref 22–32)
Chloride: 103 mmol/L (ref 101–111)
Creatinine, Ser: 0.82 mg/dL (ref 0.61–1.24)
Glucose, Bld: 108 mg/dL — ABNORMAL HIGH (ref 65–99)
Potassium: 3.1 mmol/L — ABNORMAL LOW (ref 3.5–5.1)
SODIUM: 137 mmol/L (ref 135–145)

## 2016-01-03 LAB — HIV ANTIBODY (ROUTINE TESTING W REFLEX): HIV Screen 4th Generation wRfx: NONREACTIVE

## 2016-01-03 MED ORDER — POTASSIUM CHLORIDE CRYS ER 20 MEQ PO TBCR
40.0000 meq | EXTENDED_RELEASE_TABLET | Freq: Once | ORAL | Status: AC
  Administered 2016-01-03: 40 meq via ORAL
  Filled 2016-01-03: qty 2

## 2016-01-03 NOTE — Progress Notes (Signed)
PROGRESS NOTE  Carlos [name] ZOX:[id number] DOB: 23-Apr-1976 DOA: 01/02/2016 PCP: Augustine Radar, MD  Brief History:  40 y/o male with history of diabetes mellitus, hypertension, tobacco abuse presented with a four-day history of evolving boil in the perineum. Pt states he noticed a boil in his perirectal rectal that continued to increase in size, edema and pain over 4 days. He tried Sitz baths without improvement.  He presented to ED where he had temperature of 100.6 and WBC 11.3.  General surgery was consulted and the patient underwent surgical I&D on 01/02/16.  He was started empirically on zosyn and vancomycin pending surgical culture data.  Assessment/Plan: Perineal Abscess -appreciate general surgery -01/02/2016--CT pelvis ill-defined edema in the subcutaneous tissues on either side of the gluteal fold. A phlegmonous tissue thickening along the right gluteal fold. -01/02/2016 ultrasound scrotum 3.4 x 1.5 x 1.9, complex fluid collection in the right perineum -01/02/16--surgical I&D -continue empiric vanco and zosyn pending cultures -wound care per surgery -am cbc -continue IV morphine prn pain  Diabetes mellitus type 2 -check A1C--pending -continue  Lantus  -continue ISS  HTN -continue benazepril -continued elevation may be due to pain  Tobacco Abuse -tobacco cessation discussed  Hypokalemia -replete today -check mag   Disposition Plan:   Home in 2-3 days  Family Communication:  No Family at bedside  Consultants:  General Surgery  Code Status:  FULL   DVT Prophylaxis:  [place] Lovenox   Procedures: As Listed in Progress Note Above  Antibiotics: None    Subjective: Patient denies fevers, chills, headache, chest pain, dyspnea, nausea, vomiting, diarrhea, abdominal pain, dysuria, hematuria, hematochezia, and melena. Patient complains of some pain in the perineum, worse with movement and sitting down. It is sharp in nature. There is no associated pain  with urination.    Objective: Vitals:   01/02/16 1716 01/02/16 1849 01/02/16 2017 01/03/16 0513  BP: (!) 168/97 (!) 154/86 (!) 153/87 137/77  Pulse: 95 90 94 74  Resp: (!) Temp: 98.2 F (36.8 C) (!) 100.6 F (38.1 C) 99.4 F (37.4 C) 98 F (36.7 C)  TempSrc:  Oral Oral Oral  SpO2: 100% 98% 98% 98%  Weight: 94.3 kg (207 lb 14.3 oz)       Intake/Output Summary (Last 24 hours) at 01/03/16 1155 Last data filed at 01/03/16 0900  Gross per 24 hour  Intake             2230 ml  Output              610 ml  Net             1620 ml   Weight change:  Exam:   General:  Pt is alert, follows commands appropriately, not in acute distress  HEENT: No icterus, No thrush, No neck mass, /AT  Cardiovascular: RRR, S1/S2, no rubs, no gallops  Respiratory: CTA bilaterally, no wheezing, no crackles, no rhonchi  Abdomen: Soft/+BS, non tender, non distended, no guarding  Extremities: No edema, No lymphangitis, No petechiae, No rashes, no synovitis; perineal wound with good granulation without purulent drainage.   Data Reviewed: I have personally reviewed following labs and imaging studies Basic Metabolic Panel:  Recent Labs Lab 01/02/16 1050 01/03/16 0210  NA 137 137  K 3.5 3.1*  CL 105 103  CO2 23 28  GLUCOSE 178* 108*  BUN 9 8  CREATININE 0.88 0.82  CALCIUM 9.3 8.9  Liver Function Tests:  Recent Labs Lab 01/02/16 1050  AST 19  ALT 14*  ALKPHOS 68  BILITOT 1.4*  PROT 7.2  ALBUMIN 4.1   No results for input(s): LIPASE, AMYLASE in the last 168 hours. No results for input(s): AMMONIA in the last 168 hours. Coagulation Profile: No results for input(s): INR, PROTIME in the last 168 hours. CBC:  Recent Labs Lab 01/02/16 1050 01/03/16 0210  WBC 11.3* 11.6*  NEUTROABS 7.9*  --   HGB 13.3 12.9*  HCT 37.4* 37.4*  MCV 94.4 95.2  PLT 250 237   Cardiac Enzymes:  Recent Labs Lab 01/02/16 1355  CKTOTAL 225   BNP: Invalid input(s):  POCBNP CBG:  Recent Labs Lab 01/02/16 0941 01/02/16 1557 01/02/16 1654 01/02/16 2151 01/03/16 0759  GLUCAP 182* 116* 165* 216* 177*   HbA1C: No results for input(s): HGBA1C in the last 72 hours. Urine analysis:    Component Value Date/Time   COLORURINE AMBER (A) 12/09/2015 1055   APPEARANCEUR CLEAR 12/09/2015 1055   LABSPEC 1.028 12/09/2015 1055   PHURINE 6.0 12/09/2015 1055   GLUCOSEU >1000 (A) 12/09/2015 1055   HGBUR NEGATIVE 12/09/2015 1055   BILIRUBINUR SMALL (A) 12/09/2015 1055   KETONESUR 40 (A) 12/09/2015 1055   PROTEINUR NEGATIVE 12/09/2015 1055   UROBILINOGEN 0.2 11/01/2012 1115   NITRITE NEGATIVE 12/09/2015 1055   LEUKOCYTESUR NEGATIVE 12/09/2015 1055   Sepsis Labs: @LABRCNTIP (procalcitonin:4,lacticidven:4) ) Recent Results (from the past 240 hour(s))  Wound or Superficial Culture     Status: None (Preliminary result)   Collection Time: 01/02/16 10:11 AM  Result Value Ref Range Status   Specimen Description ABSCESS GROIN  Final   Special Requests Normal  Final   Gram Stain   Final    ABUNDANT WBC PRESENT, PREDOMINANTLY PMN ABUNDANT GRAM POSITIVE COCCI IN PAIRS AND CHAINS ABUNDANT GRAM NEGATIVE COCCOBACILLI FEW GRAM POSITIVE RODS Gram Stain Report Called to,Read Back By and Verified With: H HALL,RN AT 1139 01/02/16 BY L BENFIELD    Culture CULTURE REINCUBATED FOR BETTER GROWTH  Final   Report Status PENDING  Incomplete  Aerobic/Anaerobic Culture (surgical/deep wound)     Status: None (Preliminary result)   Collection Time: 01/02/16  4:29 PM  Result Value Ref Range Status   Specimen Description ABSCESS  Final   Special Requests PERIRECTAL  Final   Gram Stain   Final    MODERATE WBC PRESENT, PREDOMINANTLY PMN FEW GRAM NEGATIVE RODS FEW GRAM POSITIVE COCCI IN PAIRS    Culture PENDING  Incomplete   Report Status PENDING  Incomplete     Scheduled Meds: . benazepril  20 mg Oral Daily  . enoxaparin (LOVENOX) injection  40 mg Subcutaneous Q24H  .  insulin aspart  0-5 Units Subcutaneous QHS  . insulin aspart  0-9 Units Subcutaneous TID WC  . insulin glargine  16 Units Subcutaneous q morning - 10a  . lidocaine (PF)  30 mL Intradermal Once  . piperacillin-tazobactam (ZOSYN)  IV  3.375 g Intravenous Q8H  . vancomycin  1,000 mg Intravenous Q8H   Continuous Infusions:   Procedures/Studies: Ct Pelvis W Contrast  Result Date: 01/02/2016 CLINICAL DATA:  Boil in middle of buttocks for 3 days, ruptured today. EXAM: CT PELVIS WITH CONTRAST TECHNIQUE: Multidetector CT imaging of the pelvis was performed using the standard protocol following the bolus administration of intravenous contrast. CONTRAST:  ISOVUE-300 IOPAMIDOL (ISOVUE-300) INJECTION 61% COMPARISON:  None. FINDINGS: Ill-defined edema within the subcutaneous soft tissues on either side of the gluteal fold.  Phlegmonous like soft tissue thickening also noted along the gluteal fold. No circumscribed abscess collection seen. Intrapelvic soft tissues are unremarkable. No intrapelvic abscess or free fluid. Pelvic portion of the bowel appears normal. No free intraperitoneal air. No acute- appearing osseous abnormality. IMPRESSION: Ill-defined edema within the subcutaneous soft tissues on either side of the gluteal fold. Additional phlegmonous-like soft tissue thickening along the left lateral aspect of the gluteal fold. No circumscribed fluid collection or abscess-like collection seen. No intrapelvic abscess collection or free fluid. Visualized portions of the bowel appear normal. Electronically Signed   By: Bary Richard M.D.   On: 01/02/2016 12:37   US Scrotum  Result Date: 01/02/2016 CLINICAL DATA:  Scrotal abscess. EXAM: ULTRASOUND OF SCROTUM TECHNIQUE: Complete ultrasound examination of the testicles, epididymis, and other scrotal structures was performed. COMPARISON:  CT abdomen pelvis 01/02/2016. FINDINGS: Right testicle Measurements: 4.5 x 2.7 x 3.0 cm. No mass or microlithiasis. Color  Doppler flow is identified. Left testicle Measurements: 4.1 x 2.3 x 3.0 cm. No mass or microlithiasis. Color Doppler flow is identified. Right epididymis:  Normal in size and appearance. Left epididymis:  8 mm anechoic epididymal cyst or spermatocele. Hydrocele:  Small bilateral. Varicocele:  None visualized. Other: Scrotal and perineal soft tissue thickening with a complex fluid collection along the right perineum, measuring approximately 3.4 x 1.5 x 1.9 cm. IMPRESSION: 1. Complex scrotal/perineal fluid collection is indicative of an abscess. 2. Small bilateral hydroceles. Electronically Signed   By: Leanna Battles M.D.   On: 01/02/2016 14:24    Kmari Brian, DO  [facility] Pager [phone]  If 7PM-7AM, please contact night-coverage [email or web address] Password [email or web address] 01/03/2016, 11:55 AM   LOS: 0 days

## 2016-01-03 NOTE — Progress Notes (Signed)
1 Day Post-Op  Subjective: No complaints  Objective: Vital signs in last 24 hours: Temp:  [98 F (36.7 C)-100.6 F (38.1 C)] 98 F (36.7 C) (07/23 0513) Pulse Rate:  [74-103] 74 (07/23 0513) Resp:  [18-26] 19 (07/23 0513) BP: (137-169)/(77-102) 137/77 (07/23 0513) SpO2:  [96 %-100 %] 98 % (07/23 0513) Weight:  [94.3 kg (207 lb 14.3 oz)] 94.3 kg (207 lb 14.3 oz) (07/22 1716) Last BM Date: 01/01/16  Intake/Output from previous day: 07/22 0701 - 07/23 0700 In: 1990 [P.O.:240; I.V.:1500; IV Piggyback:250] Out: 610 [Urine:600; Blood:10] Intake/Output this shift: Total I/O In: 240 [P.O.:240] Out: -   Resp: clear to auscultation bilaterally Cardio: regular rate and rhythm GI: soft, non-tender; bowel sounds normal; no masses,  no organomegaly Male genitalia: open wound with packing. mild drainage  Lab Results:   Recent Labs  01/02/16 1050 01/03/16 0210  WBC 11.3* 11.6*  HGB 13.3 12.9*  HCT 37.4* 37.4*  PLT 250 237   BMET  Recent Labs  01/02/16 1050 01/03/16 0210  NA 137 137  K 3.5 3.1*  CL 105 103  CO2 23 28  GLUCOSE 178* 108*  BUN 9 8  CREATININE 0.88 0.82  CALCIUM 9.3 8.9   PT/INR No results for input([name]): LABPROT, INR in the last 72 hours. ABG No results for input([name]): PHART, HCO3 in the last 72 hours.  Invalid input([name]): PCO2, PO2  Studies/Results: Ct Pelvis W Contrast  Result Date: 01/02/2016 CLINICAL DATA:  Boil in middle of buttocks for 3 days, ruptured today. EXAM: CT PELVIS WITH CONTRAST TECHNIQUE: Multidetector CT imaging of the pelvis was performed using the standard protocol following the bolus administration of intravenous contrast. CONTRAST:  ISOVUE-300 IOPAMIDOL (ISOVUE-300) INJECTION 61% COMPARISON:  None. FINDINGS: Ill-defined edema within the subcutaneous soft tissues on either side of the gluteal fold. Phlegmonous like soft tissue thickening also noted along the gluteal fold. No circumscribed abscess collection seen. Intrapelvic soft  tissues are unremarkable. No intrapelvic abscess or free fluid. Pelvic portion of the bowel appears normal. No free intraperitoneal air. No acute- appearing osseous abnormality. IMPRESSION: Ill-defined edema within the subcutaneous soft tissues on either side of the gluteal fold. Additional phlegmonous-like soft tissue thickening along the left lateral aspect of the gluteal fold. No circumscribed fluid collection or abscess-like collection seen. No intrapelvic abscess collection or free fluid. Visualized portions of the bowel appear normal. Electronically Signed   By: Bary Richard M.D.   On: 01/02/2016 12:37   US Scrotum  Result Date: 01/02/2016 CLINICAL DATA:  Scrotal abscess. EXAM: ULTRASOUND OF SCROTUM TECHNIQUE: Complete ultrasound examination of the testicles, epididymis, and other scrotal structures was performed. COMPARISON:  CT abdomen pelvis 01/02/2016. FINDINGS: Right testicle Measurements: 4.5 x 2.7 x 3.0 cm. No mass or microlithiasis. Color Doppler flow is identified. Left testicle Measurements: 4.1 x 2.3 x 3.0 cm. No mass or microlithiasis. Color Doppler flow is identified. Right epididymis:  Normal in size and appearance. Left epididymis:  8 mm anechoic epididymal cyst or spermatocele. Hydrocele:  Small bilateral. Varicocele:  None visualized. Other: Scrotal and perineal soft tissue thickening with a complex fluid collection along the right perineum, measuring approximately 3.4 x 1.5 x 1.9 cm. IMPRESSION: 1. Complex scrotal/perineal fluid collection is indicative of an abscess. 2. Small bilateral hydroceles. Electronically Signed   By: Leanna Battles M.D.   On: 01/02/2016 14:24    Anti-infectives: Anti-infectives    Start     Dose/Rate Route Frequency Ordered Stop   01/02/16 2200  vancomycin (  VANCOCIN) IVPB 1000 mg/200 mL premix     1,000 mg 200 mL/hr over 60 Minutes Intravenous Every 8 hours 01/02/16 1802     01/02/16 1830  piperacillin-tazobactam (ZOSYN) IVPB 3.375 g     3.375 g 12.5  mL/hr over 240 Minutes Intravenous Every 8 hours 01/02/16 1802     01/02/16 1600  vancomycin (VANCOCIN) IVPB 1000 mg/200 mL premix  Status:  Discontinued     1,000 mg 200 mL/hr over 60 Minutes Intravenous  Once 01/02/16 1550 01/02/16 1724   01/02/16 1554  vancomycin (VANCOCIN) 1-5 GM/200ML-% IVPB    Comments:  Shireen Quan   : cabinet override      01/02/16 1554 01/02/16 1617   01/02/16 1030  clindamycin (CLEOCIN) IVPB 600 mg     600 mg 100 mL/hr over 30 Minutes Intravenous  Once 01/02/16 1029 01/02/16 1117      Assessment/Plan: [name]/p Procedure([name]): IRRIGATION AND DEBRIDEMENT PERIRECTAL ABSCESS (N/A) Advance diet  Start dressing changes today Vanc/Zosyn day 1  LOS: 0 days    Carlos [name],Carlos [name] 01/03/2016

## 2016-01-04 DIAGNOSIS — N498 Inflammatory disorders of other specified male genital organs: Secondary | ICD-10-CM

## 2016-01-04 DIAGNOSIS — E1165 Type 2 diabetes mellitus with hyperglycemia: Secondary | ICD-10-CM

## 2016-01-04 DIAGNOSIS — Z794 Long term (current) use of insulin: Secondary | ICD-10-CM

## 2016-01-04 DIAGNOSIS — L02219 Cutaneous abscess of trunk, unspecified: Secondary | ICD-10-CM

## 2016-01-04 DIAGNOSIS — F17200 Nicotine dependence, unspecified, uncomplicated: Secondary | ICD-10-CM

## 2016-01-04 DIAGNOSIS — L03319 Cellulitis of trunk, unspecified: Secondary | ICD-10-CM

## 2016-01-04 LAB — BASIC METABOLIC PANEL
ANION GAP: 7 (ref 5–15)
BUN: 6 mg/dL (ref 6–20)
CHLORIDE: 102 mmol/L (ref 101–111)
CO2: 27 mmol/L (ref 22–32)
Calcium: 9.1 mg/dL (ref 8.9–10.3)
Creatinine, Ser: 0.92 mg/dL (ref 0.61–1.24)
GFR calc non Af Amer: >60 mL/min (ref >60)
Glucose, Bld: 162 mg/dL — ABNORMAL HIGH (ref 65–99)
POTASSIUM: 3.6 mmol/L (ref 3.5–5.1)
SODIUM: 136 mmol/L (ref 135–145)

## 2016-01-04 LAB — AEROBIC CULTURE  (SUPERFICIAL SPECIMEN): SPECIAL REQUESTS: NORMAL

## 2016-01-04 LAB — CBC
HEMATOCRIT: 37.2 % — AB (ref 39.0–52.0)
Hemoglobin: 12.9 g/dL — ABNORMAL LOW (ref 13.0–17.0)
MCH: 32.7 pg (ref 26.0–34.0)
MCHC: 34.7 g/dL (ref 30.0–36.0)
MCV: 94.2 fL (ref 78.0–100.0)
Platelets: 242 10*3/uL (ref 150–400)
RBC: 3.95 MIL/uL — ABNORMAL LOW (ref 4.22–5.81)
RDW: 12.6 % (ref 11.5–15.5)
WBC: 5.7 10*3/uL (ref 4.0–10.5)

## 2016-01-04 LAB — PROCALCITONIN: Procalcitonin: <0.1 ng/mL

## 2016-01-04 LAB — AEROBIC CULTURE W GRAM STAIN (SUPERFICIAL SPECIMEN)

## 2016-01-04 LAB — GLUCOSE, CAPILLARY
GLUCOSE-CAPILLARY: 217 mg/dL — AB (ref 65–99)
GLUCOSE-CAPILLARY: 170 mg/dL — AB (ref 65–99)
Glucose-Capillary: 225 mg/dL — ABNORMAL HIGH (ref 65–99)
Glucose-Capillary: 215 mg/dL — ABNORMAL HIGH (ref 65–99)

## 2016-01-04 LAB — MAGNESIUM: Magnesium: 1.9 mg/dL (ref 1.7–2.4)

## 2016-01-04 LAB — HEMOGLOBIN A1C
HEMOGLOBIN A1C: 10 % — AB (ref 4.8–5.6)
Mean Plasma Glucose: 240 mg/dL

## 2016-01-04 MED ORDER — METRONIDAZOLE 500 MG PO TABS: 500.0000 mg | ORAL_TABLET | Freq: Three times a day (TID) | ORAL | 0 refills | Status: AC

## 2016-01-04 MED ORDER — INSULIN GLARGINE 100 UNIT/ML SOLOSTAR PEN: 22.0000 [IU] | PEN_INJECTOR | SUBCUTANEOUS | 0 refills | Status: DC

## 2016-01-04 MED ORDER — INSULIN GLARGINE 100 UNIT/ML SOLOSTAR PEN: 24.0000 [IU] | PEN_INJECTOR | SUBCUTANEOUS | 0 refills | Status: DC

## 2016-01-04 MED ORDER — INSULIN GLARGINE 100 UNIT/ML ~~LOC~~ SOLN
24.0000 [IU] | Freq: Every day | SUBCUTANEOUS | Status: DC
Start: 2016-01-05 — End: 2016-01-05
  Administered 2016-01-05: 24 [IU] via SUBCUTANEOUS
  Filled 2016-01-04: qty 0.24

## 2016-01-04 MED ORDER — INSULIN ASPART PROT & ASPART (70-30 MIX) 100 UNIT/ML ~~LOC~~ SUSP
15.0000 [IU] | Freq: Two times a day (BID) | SUBCUTANEOUS | Status: DC
Start: 2016-01-05 — End: 2016-01-04
  Filled 2016-01-04: qty 10

## 2016-01-04 MED ORDER — INSULIN GLARGINE 100 UNIT/ML ~~LOC~~ SOLN
22.0000 [IU] | Freq: Every day | SUBCUTANEOUS | Status: DC
Start: 2016-01-05 — End: 2016-01-04

## 2016-01-04 MED ORDER — INSULIN ASPART 100 UNIT/ML ~~LOC~~ SOLN: 0.0000 [IU] | Freq: Every day | SUBCUTANEOUS | Status: DC

## 2016-01-04 MED ORDER — CIPROFLOXACIN HCL 500 MG PO TABS: 500.0000 mg | ORAL_TABLET | Freq: Two times a day (BID) | ORAL | 0 refills | Status: DC

## 2016-01-04 MED ORDER — AMLODIPINE BESYLATE 5 MG PO TABS
5.0000 mg | ORAL_TABLET | Freq: Every day | ORAL | Status: DC
  Administered 2016-01-04 – 2016-01-05 (×2): 5 mg via ORAL
  Filled 2016-01-04 (×2): qty 1

## 2016-01-04 MED ORDER — INSULIN ASPART 100 UNIT/ML ~~LOC~~ SOLN
0.0000 [IU] | Freq: Three times a day (TID) | SUBCUTANEOUS | Status: DC
  Administered 2016-01-04: 5 [IU] via SUBCUTANEOUS
  Administered 2016-01-05: 3 [IU] via SUBCUTANEOUS

## 2016-01-04 MED ORDER — HYDROCODONE-ACETAMINOPHEN 5-325 MG PO TABS: 1.0000 | ORAL_TABLET | Freq: Four times a day (QID) | ORAL | 0 refills | Status: DC | PRN

## 2016-01-04 MED ORDER — INSULIN ASPART 100 UNIT/ML ~~LOC~~ SOLN
3.0000 [IU] | Freq: Three times a day (TID) | SUBCUTANEOUS | Status: DC
  Administered 2016-01-04 – 2016-01-05 (×2): 3 [IU] via SUBCUTANEOUS

## 2016-01-04 NOTE — Care Management Note (Addendum)
Case Management Note  Patient Details  Name: Carlos [name] MRN: [id number] Date of Birth: Jun 22, 1975  Subjective/Objective:                    Action/Plan:   Expected Discharge Date:  01/03/16               Expected Discharge Plan:  Home w Home Health Services  In-House Referral:     Discharge planning Services  CM Consult, MATCH Program, Medication Assistance, Indigent Health Clinic  Post Acute Care Choice:    Choice offered to:  Patient  DME Arranged:    DME Agency:     HH Arranged:  RN HH Agency:  [facility]  Status of Service:  Completed, signed off  If discussed at Long Length of Stay Meetings, dates discussed:    Additional Comments: Confirmed face sheet information with patient . Patient states he believes he can reach dressing site and be taught himself how to change dressing , he is aware HHRN will not be there every time dressing needs to be changed. Kingsley Plan, RN 01/04/2016, 10:25 AM    1521 Spoke with Darl Pikes from [facility] , patient does not qualify for charity for [facility] . He would have to pay roughly $125/visit . Darl Pikes explained same to patient . He voiced understanding and bedside nurse will teach him to do dressing change before discharge . Patient agreeable. Ronny Flurry RN BSN

## 2016-01-04 NOTE — Progress Notes (Signed)
Inpatient Diabetes Program Recommendations  AACE/ADA: New Consensus Statement on Inpatient Glycemic Control (2015)  Target Ranges:  Prepandial:   less than 140 mg/dL      Peak postprandial:   less than 180 mg/dL (1-2 hours)      Critically ill patients:  140 - 180 mg/dL  Results for Carlos [name], Carlos [name] (MRN [id number]) as of 01/04/2016 12:33  Ref. Range 01/03/2016 07:59 01/03/2016 12:08 01/03/2016 18:02 01/03/2016 22:12 01/04/2016 08:08 01/04/2016 12:06  Glucose-Capillary Latest Ref Range: 65 - 99 mg/dL 155 (H) 208 (H) 022 (H) 173 (H) 225 (H) 215 (H)   Results for Carlos [name], Carlos [name] (MRN [id number]) as of 01/04/2016 12:33  Ref. Range 01/02/2016 13:55  Hemoglobin A1C Latest Ref Range: 4.8 - 5.6 % 10.0 (H)    Review of Glycemic Control  Outpatient Diabetes medications: Lantus 16 units qd Current orders for Inpatient glycemic control: Lantus 16 units qd + Novolog correction 0-9 units tid + 0-5 units qhs  Inpatient Diabetes Program Recommendations:  Spoke with patient concerning elevated A1c and gave handout concerning normal A1c and risks involved with hyperglycemia. Patient states he has had DM x 17 years. Patient has been using Lantus given to him by a friend that is also on Lantus but was using on the last pen. Noted followup and on [facility] program with [facility] services post discharge. Patient could benefit with adding meal coverage.  Please consider to decrease costs and add meal coverage without adding short acting insulin: 70/30 insulin 13 units bid ac breakfast and supper.  Thank you, Billy Fischer. Missey Hasley, RN, MSN, CDE Inpatient Glycemic Control Team Team Pager [phone] (8am-5pm) 01/04/2016 12:39 PM

## 2016-01-04 NOTE — Discharge Instructions (Signed)
Perirectal Abscess An abscess is an infected area that contains a collection of pus. A perirectal abscess is an abscess that is near the opening of the anus or around the rectum. A perirectal abscess can cause a lot of pain, especially during bowel movements. CAUSES This condition is almost always caused by an infection that starts in an anal gland. RISK FACTORS This condition is more likely to develop in:  People with diabetes or inflammatory bowel disease.  People whose body defense system (immune system) is weak.  People who have anal sex.  People who have a sexually transmitted disease (STD).  People who have certain kinds of cancers, such as rectal carcinoma, leukemia, or lymphoma. SYMPTOMS The main symptom of this condition is pain. The pain may be a throbbing pain that gets worse during bowel movements. Other symptoms include:  Fever.  Swelling.  Redness.  Bleeding.  Constipation. DIAGNOSIS The condition is diagnosed with a physical exam. If the abscess is not visible, a health care provider may need to place a finger inside the rectum to find the abscess. Sometimes, imaging tests are done to determine the size and location of the abscess. These tests may include:  An ultrasound.  An MRI.  A CT scan. TREATMENT This condition is usually treated with incision and drainage surgery. Incision and drainage surgery involves making an incision over the abscess to drain the pus. Treatment may also involve antibiotic medicine, pain medicine, stool softeners, or laxatives. HOME CARE INSTRUCTIONS  Take medicines only as directed by your health care provider.  If you were prescribed an antibiotic, finish all of it even if you start to feel better.  To relieve pain, try sitting:  In a warm, shallow bath (sitz bath).  On a heating pad with the setting on low.  On an inflatable donut-shaped cushion.  Follow any diet instructions as directed by your health care  provider.  Keep all follow-up visits as directed by your health care provider. This is important. SEEK MEDICAL CARE IF:  Your abscess is bleeding.  You have pain, swelling, or redness that is getting worse.  You are constipated.  You feel ill.  You have muscle aches or chills.  You have a fever.  Your symptoms return after the abscess has healed.   This information is not intended to replace advice given to you by your health care provider. Make sure you discuss any questions you have with your health care provider.   Document Released: 05/27/2000 Document Revised: 02/18/2015 Document Reviewed: 04/09/2014 Elsevier Interactive Patient Education 2016 [employer] Inc[employer]  

## 2016-01-04 NOTE — Progress Notes (Signed)
PROGRESS NOTE  Carlos [name] NGE:[id number] DOB: April 10, 1976 DOA: 01/02/2016 PCP: Augustine Radar, MD  Brief History:  40 y/o male with history of diabetes mellitus, hypertension, tobacco abuse presented with a four-day history of evolving boil in the perineum. Pt states he noticed a boil in his perirectal rectal that continued to increase in size, edema and pain over 4 days. He tried Sitz baths without improvement.  He presented to ED where he had temperature of 100.6 and WBC 11.3.  General surgery was consulted and the patient underwent surgical I&D on 01/02/16.  He was started empirically on zosyn and vancomycin pending surgical culture data.  Assessment/Plan: Perineal Abscess -appreciate general surgery -01/02/2016--CT pelvis ill-defined edema in the subcutaneous tissues on either side of the gluteal fold. A phlegmonous tissue thickening along the right gluteal fold. -01/02/2016 ultrasound scrotum 3.4 x 1.5 x 1.9, complex fluid collection in the right perineum -01/02/16--surgical I&D -continue empiriczosyn pending cultures -d/c vancomycin -wound care per surgery -am cbc -still requiring IV morphine prn pain -HIV neg  Diabetes mellitus type 2 -check A1C--pending -Increase  Lantus 22 units -add novolog 3 units with meals -continue ISS  HTN -continue benazepril -add amlodipine 5 mg daily  Tobacco Abuse -tobacco cessation discussed  Hypokalemia -replete -check mag--1.9   Disposition Plan:   Home in 2-3 days  Family Communication:  No Family at bedside  Consultants:  General Surgery  Code Status:  FULL   DVT Prophylaxis:   Lovenox   Procedures: As Listed in Progress Note Above  Antibiotics: None    Subjective: Overall pain is getting better in the perineum. Denies fevers, chills, chest pain, nausea, vomiting, diarrhea, abdominal pain. No dysuria or hematuria. No rashes. Denies any headache or neck pain.  Objective: Vitals:   01/04/16 0836 01/04/16 1406 01/04/16 1429 01/04/16 1431  BP: (!) 159/86 (!) 169/92 (!) 160/81 (!) 151/78  Pulse: 90 91 79 78  Resp:      Temp:  97.5 F (36.4 C)    TempSrc:  Axillary    SpO2:  100%    Weight:        Intake/Output Summary (Last 24 hours) at 01/04/16 1656 Last data filed at 01/04/16 1406  Gross per 24 hour  Intake             1480 ml  Output                0 ml  Net             1480 ml   Weight change:  Exam:   General:  Pt is alert, follows commands appropriately, not in acute distress  HEENT: No icterus, No thrush, No neck mass, [place]/AT  Cardiovascular: RRR, S1/S2, no rubs, no gallops  Respiratory: CTA bilaterally, no wheezing, no crackles, no rhonchi  Abdomen: Soft/+BS, non tender, non distended, no guarding  Extremities: No edema, No lymphangitis, No petechiae, No rashes, no synovitis   Data Reviewed: I have personally reviewed following labs and imaging studies Basic Metabolic Panel:  Recent Labs Lab 01/02/16 1050 01/03/16 0210 01/04/16 0511  NA 137 137 136  K 3.5 3.1* 3.6  CL 105 103 102  CO2 GLUCOSE 178* 108* 162*  BUN CREATININE 0.88 0.82 0.92  CALCIUM 9.3 8.9 9.1  MG  --   --  1.9   Liver Function Tests:  Recent Labs Lab 01/02/16 1050  AST 19  ALT 14*  ALKPHOS 68  BILITOT 1.4*  PROT 7.2  ALBUMIN 4.1   No results for input(s): LIPASE, AMYLASE in the last 168 hours. No results for input(s): AMMONIA in the last 168 hours. Coagulation Profile: No results for input(s): INR, PROTIME in the last 168 hours. CBC:  Recent Labs Lab 01/02/16 1050 01/03/16 0210 01/04/16 0511  WBC 11.3* 11.6* 5.7  NEUTROABS 7.9*  --   --   HGB 13.3 12.9* 12.9*  HCT 37.4* 37.4* 37.2*  MCV 94.4 95.2 94.2  PLT 250 237 242   Cardiac Enzymes:  Recent Labs Lab 01/02/16 1355  CKTOTAL 225   BNP: Invalid input(s): POCBNP CBG:  Recent Labs Lab 01/03/16 1208 01/03/16 1802 01/03/16 2212 01/04/16 0808 01/04/16 1206    GLUCAP 205* 225* 173* 225* 215*   HbA1C:  Recent Labs  01/02/16 1355  HGBA1C 10.0*   Urine analysis:    Component Value Date/Time   COLORURINE AMBER (A) 12/09/2015 1055   APPEARANCEUR CLEAR 12/09/2015 1055   LABSPEC 1.028 12/09/2015 1055   PHURINE 6.0 12/09/2015 1055   GLUCOSEU >1000 (A) 12/09/2015 1055   HGBUR NEGATIVE 12/09/2015 1055   BILIRUBINUR SMALL (A) 12/09/2015 1055   KETONESUR 40 (A) 12/09/2015 1055   PROTEINUR NEGATIVE 12/09/2015 1055   UROBILINOGEN 0.2 11/01/2012 1115   NITRITE NEGATIVE 12/09/2015 1055   LEUKOCYTESUR NEGATIVE 12/09/2015 1055   Sepsis Labs: @LABRCNTIP (procalcitonin:4,lacticidven:4) ) Recent Results (from the past 240 hour(s))  Wound or Superficial Culture     Status: None   Collection Time: 01/02/16 10:11 AM  Result Value Ref Range Status   Specimen Description ABSCESS GROIN  Final   Special Requests Normal  Final   Gram Stain   Final    ABUNDANT WBC PRESENT, PREDOMINANTLY PMN ABUNDANT GRAM POSITIVE COCCI IN PAIRS AND CHAINS ABUNDANT GRAM NEGATIVE COCCOBACILLI FEW GRAM POSITIVE RODS Gram Stain Report Called to,Read Back By and Verified With: H HALL,RN AT 1139 01/02/16 BY L BENFIELD    Culture ABUNDANT STREPTOCOCCUS GROUP F  Final   Report Status 01/04/2016 FINAL  Final  Culture, blood (Routine X 2) w Reflex to ID Panel     Status: None (Preliminary result)   Collection Time: 01/02/16  1:40 PM  Result Value Ref Range Status   Specimen Description BLOOD RIGHT ANTECUBITAL  Final   Special Requests BOTTLES DRAWN AEROBIC AND ANAEROBIC  5CC  Final   Culture NO GROWTH 2 DAYS  Final   Report Status PENDING  Incomplete  Culture, blood (Routine X 2) w Reflex to ID Panel     Status: None (Preliminary result)   Collection Time: 01/02/16  1:45 PM  Result Value Ref Range Status   Specimen Description BLOOD RIGHT HAND  Final   Special Requests BOTTLES DRAWN AEROBIC AND ANAEROBIC  5CC  Final   Culture NO GROWTH 2 DAYS  Final   Report Status PENDING   Incomplete  Aerobic/Anaerobic Culture (surgical/deep wound)     Status: None (Preliminary result)   Collection Time: 01/02/16  4:29 PM  Result Value Ref Range Status   Specimen Description ABSCESS  Final   Special Requests PERIRECTAL  Final   Gram Stain   Final    MODERATE WBC PRESENT, PREDOMINANTLY PMN FEW GRAM NEGATIVE RODS FEW GRAM POSITIVE COCCI IN PAIRS    Culture   Final    FEW STREPTOCOCCUS GROUP F NO ANAEROBES ISOLATED; CULTURE IN PROGRESS FOR 5 DAYS    Report Status PENDING  Incomplete     Scheduled  Meds: . benazepril  20 mg Oral Daily  . enoxaparin (LOVENOX) injection  40 mg Subcutaneous Q24H  . insulin aspart  0-15 Units Subcutaneous TID WC  . insulin aspart  0-5 Units Subcutaneous QHS  . [START ON 01/05/2016] insulin aspart protamine- aspart  15 Units Subcutaneous BID WC  . lidocaine (PF)  30 mL Intradermal Once  . piperacillin-tazobactam (ZOSYN)  IV  3.375 g Intravenous Q8H  . vancomycin  1,000 mg Intravenous Q8H   Continuous Infusions:   Procedures/Studies: Ct Pelvis W Contrast  Result Date: 01/02/2016 CLINICAL DATA:  Boil in middle of buttocks for 3 days, ruptured today. EXAM: CT PELVIS WITH CONTRAST TECHNIQUE: Multidetector CT imaging of the pelvis was performed using the standard protocol following the bolus administration of intravenous contrast. CONTRAST:  ISOVUE-300 IOPAMIDOL (ISOVUE-300) INJECTION 61% COMPARISON:  None. FINDINGS: Ill-defined edema within the subcutaneous soft tissues on either side of the gluteal fold. Phlegmonous like soft tissue thickening also noted along the gluteal fold. No circumscribed abscess collection seen. Intrapelvic soft tissues are unremarkable. No intrapelvic abscess or free fluid. Pelvic portion of the bowel appears normal. No free intraperitoneal air. No acute- appearing osseous abnormality. IMPRESSION: Ill-defined edema within the subcutaneous soft tissues on either side of the gluteal fold. Additional phlegmonous-like  soft tissue thickening along the left lateral aspect of the gluteal fold. No circumscribed fluid collection or abscess-like collection seen. No intrapelvic abscess collection or free fluid. Visualized portions of the bowel appear normal. Electronically Signed   By: Bary Richard M.D.   On: 01/02/2016 12:37   US Scrotum  Result Date: 01/02/2016 CLINICAL DATA:  Scrotal abscess. EXAM: ULTRASOUND OF SCROTUM TECHNIQUE: Complete ultrasound examination of the testicles, epididymis, and other scrotal structures was performed. COMPARISON:  CT abdomen pelvis 01/02/2016. FINDINGS: Right testicle Measurements: 4.5 x 2.7 x 3.0 cm. No mass or microlithiasis. Color Doppler flow is identified. Left testicle Measurements: 4.1 x 2.3 x 3.0 cm. No mass or microlithiasis. Color Doppler flow is identified. Right epididymis:  Normal in size and appearance. Left epididymis:  8 mm anechoic epididymal cyst or spermatocele. Hydrocele:  Small bilateral. Varicocele:  None visualized. Other: Scrotal and perineal soft tissue thickening with a complex fluid collection along the right perineum, measuring approximately 3.4 x 1.5 x 1.9 cm. IMPRESSION: 1. Complex scrotal/perineal fluid collection is indicative of an abscess. 2. Small bilateral hydroceles. Electronically Signed   By: Leanna Battles M.D.   On: 01/02/2016 14:24    Carlos Illescas, DO  [facility] Pager [phone]  If 7PM-7AM, please contact night-coverage [email or web address] Password [email or web address] 01/04/2016, 4:56 PM   LOS: 1 day

## 2016-01-04 NOTE — Discharge Summary (Signed)
Physician Discharge Summary  Carlos [name] ZOX:[id number] DOB: August 26, 1975 DOA: 01/02/2016  PCP: Augustine Radar, MD  Admit date: 01/02/2016 Discharge date: 01/05/16  Admitted From: Home Disposition:  Home  Recommendations for Outpatient Follow-up:  1. Follow up with PCP in 1-2 weeks 2. Please obtain BMP/CBC in one week   Home Health: no Equipment/Devices: none  Discharge Condition:stable CODE STATUS:FULL Diet recommendation: Heart Healthy / Carb Modified /    Brief/Interim Summary: 40 y/o male with historyof diabetes mellitus, hypertension, tobacco abuse presented with a four-day history of evolving boil in the perineum. Pt states he noticed a boil in his perirectal rectal that continued to increase in size, edema and pain over 4 days. He tried Sitz baths without improvement. He presented to ED where he had temperature of 100.6 and WBC 11.3. General surgery was consulted and the patient underwent surgical I&D on 01/02/16. He was started empirically on zosyn and vancomycin pending surgical culture data.  He was transitioned to po abx.  Discharge Diagnoses:  Perineal Abscess -appreciate general surgery -01/02/2016--CT pelvis ill-defined edema in the subcutaneous tissues on either side of the gluteal fold. A phlegmonous tissue thickening along the right gluteal fold. -01/02/2016 ultrasound scrotum 3.4 x 1.5 x 1.9, complex fluid collection in the right perineum -01/02/16--surgical I&D -continue empiriczosyn pending cultures-->home with levofloxacin 750 mg daily x 10 days and flagyl 500 mg  q 8 hours -d/c vancomycin -wound care per surgery-->wet to dry -still requiring IV morphine prn pain -HIV neg  Diabetes mellitus type 2, uncontrolled -7/22/17check A1C--10.0--question pt's compliance -Increase Lantus 24 units-->home with Lantus 24 units daily -add novolog 3 units with meals -continue ISS  HTN -continue benazepril -add amlodipine 5 mg daily  Tobacco  Abuse -tobacco cessation discussed  Hypokalemia -replete -check mag--1.9    Discharge Instructions  Discharge Instructions    Ambulatory referral to Home Health    Complete by:  As directed   Please evaluate Khori Derrell Lolling for admission to [facility].  Disciplines requested: Nursing  Services to provide: [facility] Care  Physician to follow patient's care (the person listed here will be responsible for signing ongoing orders): Referring Provider  Requested Start of Care Date: Tomorrow  I certify that this patient is under my care and that I, or a Nurse Practitioner or Physician's Assistant working with me, had a face-to-face encounter that meets the physician face-to-face requirements with patient on 01/04/16. The encounter with the patient was in whole, or in part for the following medical condition(s) which is the primary reason for home health care: perirectal abscess.  Special Instructions:  Daily wet-to-dry dressing changes using kerlix (cut to appropriate size) and sterile water. Place 4x4 gauze dressing on top and secure with tape and mesh/clean cotton underwear. Patient may shower with soap and water and apply clean dressing after showers.   Does the patient have Medicare or Medicaid?:  No   The encounter with the patient was in whole, or in part, for the following medical condition, which is the primary reason for home health care:  perirectal abscess   Reason for Medically Necessary Home Health Services:  Skilled Nursing- Post-Surgical Wound Assessment and Care   My clinical findings support the need for the above services:  Pain interferes with ambulation/mobility   I certify that, based on my findings, the following services are medically necessary home health services:  Nursing   Further, I certify that my clinical findings support that this patient is homebound due to:  Open/draining pressure/stasis ulcer  Diet Carb Modified    Complete by:  As directed   Increase  activity slowly    Complete by:  As directed       Medication List    TAKE these medications   amLODipine 5 MG tablet Commonly known as:  NORVASC Take 1 tablet (5 mg total) by mouth daily.   benazepril 20 MG tablet Commonly known as:  LOTENSIN Take 20 mg by mouth daily.   Cinnamon 500 MG capsule Take 500 mg by mouth daily.   HYDROcodone-acetaminophen 5-325 MG tablet Commonly known as:  NORCO Take 1 tablet by mouth every 6 (six) hours as needed for moderate pain.   Insulin Glargine 100 UNIT/ML Solostar Pen Commonly known as:  LANTUS SOLOSTAR Inject 24 Units into the skin every morning. What changed:  how much to take   levofloxacin 750 MG tablet Commonly known as:  LEVAQUIN Take 1 tablet (750 mg total) by mouth daily.   metroNIDAZOLE 500 MG tablet Commonly known as:  FLAGYL Take 1 tablet (500 mg total) by mouth 3 (three) times daily.      Follow-up Information    [facility], [place]. Go on 01/13/2016.   Specialty:  General Surgery Why:  Your appointment is at 10:45 AM, please arrive 30 minutes early to get checked in and fill out paperwork. Contact information: [address] [place] [address] [phone]       [facility]. Schedule an appointment as soon as possible for a visit today.   Contact information: [address] [place] [address] [phone]         Allergies  Allergen Reactions  . Penicillins Other (See Comments)    Childhood Reaction, Has patient had a PCN reaction causing immediate rash, facial/tongue/throat swelling, SOB or lightheadedness with hypotension: Unknown Has patient had a PCN reaction causing severe rash involving mucus membranes or skin necrosis: No Has patient had a PCN reaction that required hospitalization No Has patient had a PCN reaction occurring within the last 10 years: No If all of the above answers are "NO", then may  proceed with Ce    Consultations:  General surgery   Procedures/Studies: Ct Pelvis W Contrast  Result Date: 01/02/2016 CLINICAL DATA:  Boil in middle of buttocks for 3 days, ruptured today. EXAM: CT PELVIS WITH CONTRAST TECHNIQUE: Multidetector CT imaging of the pelvis was performed using the standard protocol following the bolus administration of intravenous contrast. CONTRAST:  ISOVUE-300 IOPAMIDOL (ISOVUE-300) INJECTION 61% COMPARISON:  None. FINDINGS: Ill-defined edema within the subcutaneous soft tissues on either side of the gluteal fold. Phlegmonous like soft tissue thickening also noted along the gluteal fold. No circumscribed abscess collection seen. Intrapelvic soft tissues are unremarkable. No intrapelvic abscess or free fluid. Pelvic portion of the bowel appears normal. No free intraperitoneal air. No acute- appearing osseous abnormality. IMPRESSION: Ill-defined edema within the subcutaneous soft tissues on either side of the gluteal fold. Additional phlegmonous-like soft tissue thickening along the left lateral aspect of the gluteal fold. No circumscribed fluid collection or abscess-like collection seen. No intrapelvic abscess collection or free fluid. Visualized portions of the bowel appear normal. Electronically Signed   By: Bary Richard M.D.   On: 01/02/2016 12:37   US Scrotum  Result Date: 01/02/2016 CLINICAL DATA:  Scrotal abscess. EXAM: ULTRASOUND OF SCROTUM TECHNIQUE: Complete ultrasound examination of the testicles, epididymis, and other scrotal structures was performed. COMPARISON:  CT abdomen pelvis 01/02/2016. FINDINGS: Right  testicle Measurements: 4.5 x 2.7 x 3.0 cm. No mass or microlithiasis. Color Doppler flow is identified. Left testicle Measurements: 4.1 x 2.3 x 3.0 cm. No mass or microlithiasis. Color Doppler flow is identified. Right epididymis:  Normal in size and appearance. Left epididymis:  8 mm anechoic epididymal cyst or spermatocele. Hydrocele:  Small  bilateral. Varicocele:  None visualized. Other: Scrotal and perineal soft tissue thickening with a complex fluid collection along the right perineum, measuring approximately 3.4 x 1.5 x 1.9 cm. IMPRESSION: 1. Complex scrotal/perineal fluid collection is indicative of an abscess. 2. Small bilateral hydroceles. Electronically Signed   By: Leanna Battles M.D.   On: 01/02/2016 14:24        Discharge Exam: Vitals:   01/05/16 0523 01/05/16 0833  BP: (!) 158/95 (!) 153/97  Pulse: 74 88  Resp: 16   Temp: 98.6 F (37 C)    Vitals:   01/04/16 1740 01/04/16 2129 01/05/16 0523 01/05/16 0833  BP: (!) 151/88 (!) 159/94 (!) 158/95 (!) 153/97  Pulse: 80 80 74 88  Resp:  15 16   Temp:  98.3 F (36.8 C) 98.6 F (37 C)   TempSrc:  Oral Oral   SpO2:  96% 100%   Weight:        General: Pt is alert, awake, not in acute distress Cardiovascular: RRR, S1/S2 +, no rubs, no gallops Respiratory: CTA bilaterally, no wheezing, no rhonchi Abdominal: Soft, NT, ND, bowel sounds + Extremities: no edema, no cyanosis   The results of significant diagnostics from this hospitalization (including imaging, microbiology, ancillary and laboratory) are listed below for reference.    Significant Diagnostic Studies: Ct Pelvis W Contrast  Result Date: 01/02/2016 CLINICAL DATA:  Boil in middle of buttocks for 3 days, ruptured today. EXAM: CT PELVIS WITH CONTRAST TECHNIQUE: Multidetector CT imaging of the pelvis was performed using the standard protocol following the bolus administration of intravenous contrast. CONTRAST:  ISOVUE-300 IOPAMIDOL (ISOVUE-300) INJECTION 61% COMPARISON:  None. FINDINGS: Ill-defined edema within the subcutaneous soft tissues on either side of the gluteal fold. Phlegmonous like soft tissue thickening also noted along the gluteal fold. No circumscribed abscess collection seen. Intrapelvic soft tissues are unremarkable. No intrapelvic abscess or free fluid. Pelvic portion of the bowel  appears normal. No free intraperitoneal air. No acute- appearing osseous abnormality. IMPRESSION: Ill-defined edema within the subcutaneous soft tissues on either side of the gluteal fold. Additional phlegmonous-like soft tissue thickening along the left lateral aspect of the gluteal fold. No circumscribed fluid collection or abscess-like collection seen. No intrapelvic abscess collection or free fluid. Visualized portions of the bowel appear normal. Electronically Signed   By: Bary Richard M.D.   On: 01/02/2016 12:37   US Scrotum  Result Date: 01/02/2016 CLINICAL DATA:  Scrotal abscess. EXAM: ULTRASOUND OF SCROTUM TECHNIQUE: Complete ultrasound examination of the testicles, epididymis, and other scrotal structures was performed. COMPARISON:  CT abdomen pelvis 01/02/2016. FINDINGS: Right testicle Measurements: 4.5 x 2.7 x 3.0 cm. No mass or microlithiasis. Color Doppler flow is identified. Left testicle Measurements: 4.1 x 2.3 x 3.0 cm. No mass or microlithiasis. Color Doppler flow is identified. Right epididymis:  Normal in size and appearance. Left epididymis:  8 mm anechoic epididymal cyst or spermatocele. Hydrocele:  Small bilateral. Varicocele:  None visualized. Other: Scrotal and perineal soft tissue thickening with a complex fluid collection along the right perineum, measuring approximately 3.4 x 1.5 x 1.9 cm. IMPRESSION: 1. Complex scrotal/perineal fluid collection is indicative of an abscess. 2.  Small bilateral hydroceles. Electronically Signed   By: Leanna Battles M.D.   On: 01/02/2016 14:24     Microbiology: Recent Results (from the past 240 hour(s))  Wound or Superficial Culture     Status: None   Collection Time: 01/02/16 10:11 AM  Result Value Ref Range Status   Specimen Description ABSCESS GROIN  Final   Special Requests Normal  Final   Gram Stain   Final    ABUNDANT WBC PRESENT, PREDOMINANTLY PMN ABUNDANT GRAM POSITIVE COCCI IN PAIRS AND CHAINS ABUNDANT GRAM NEGATIVE  COCCOBACILLI FEW GRAM POSITIVE RODS Gram Stain Report Called to,Read Back By and Verified With: H HALL,RN AT 1139 01/02/16 BY L BENFIELD    Culture ABUNDANT STREPTOCOCCUS GROUP F  Final   Report Status 01/04/2016 FINAL  Final  Culture, blood (Routine X 2) w Reflex to ID Panel     Status: None (Preliminary result)   Collection Time: 01/02/16  1:40 PM  Result Value Ref Range Status   Specimen Description BLOOD RIGHT ANTECUBITAL  Final   Special Requests BOTTLES DRAWN AEROBIC AND ANAEROBIC  5CC  Final   Culture NO GROWTH 2 DAYS  Final   Report Status PENDING  Incomplete  Culture, blood (Routine X 2) w Reflex to ID Panel     Status: None (Preliminary result)   Collection Time: 01/02/16  1:45 PM  Result Value Ref Range Status   Specimen Description BLOOD RIGHT HAND  Final   Special Requests BOTTLES DRAWN AEROBIC AND ANAEROBIC  5CC  Final   Culture NO GROWTH 2 DAYS  Final   Report Status PENDING  Incomplete  Aerobic/Anaerobic Culture (surgical/deep wound)     Status: None (Preliminary result)   Collection Time: 01/02/16  4:29 PM  Result Value Ref Range Status   Specimen Description ABSCESS  Final   Special Requests PERIRECTAL  Final   Gram Stain   Final    MODERATE WBC PRESENT, PREDOMINANTLY PMN FEW GRAM NEGATIVE RODS FEW GRAM POSITIVE COCCI IN PAIRS    Culture   Final    FEW STREPTOCOCCUS GROUP F NO ANAEROBES ISOLATED; CULTURE IN PROGRESS FOR 5 DAYS    Report Status PENDING  Incomplete     Labs: Basic Metabolic Panel:  Recent Labs Lab 01/02/16 1050 01/03/16 0210 01/04/16 0511  NA 137 137 136  K 3.5 3.1* 3.6  CL 105 103 102  CO2 23 28 27   GLUCOSE 178* 108* 162*  BUN 9 8 6   CREATININE 0.88 0.82 0.92  CALCIUM 9.3 8.9 9.1  MG  --   --  1.9   Liver Function Tests:  Recent Labs Lab 01/02/16 1050  AST 19  ALT 14*  ALKPHOS 68  BILITOT 1.4*  PROT 7.2  ALBUMIN 4.1   No results for input(s): LIPASE, AMYLASE in the last 168 hours. No results for input(s): AMMONIA in  the last 168 hours. CBC:  Recent Labs Lab 01/02/16 1050 01/03/16 0210 01/04/16 0511  WBC 11.3* 11.6* 5.7  NEUTROABS 7.9*  --   --   HGB 13.3 12.9* 12.9*  HCT 37.4* 37.4* 37.2*  MCV 94.4 95.2 94.2  PLT 250 237 242   Cardiac Enzymes:  Recent Labs Lab 01/02/16 1355  CKTOTAL 225   BNP: Invalid input(s): POCBNP CBG:  Recent Labs Lab 01/04/16 0808 01/04/16 1206 01/04/16 1654 01/04/16 2131 01/05/16 0807  GLUCAP 225* 215* 217* 170* 183*    Time coordinating discharge:  Greater than 30 minutes  Signed:  Querida Beretta, DO [facility]  Pager: [phone] 01/05/2016, 12:01 PM

## 2016-01-04 NOTE — Progress Notes (Signed)
[facility] [place] [facility] Progress Note  2 Days Post-Op  Subjective: Afebrile. No complaints. WBC normal this AM.  Objective: Vital signs in last 24 hours: Temp:  [97.9 F (36.6 C)-98.5 F (36.9 C)] 98.5 F (36.9 C) (07/24 0629) Pulse Rate:  [79-83] 80 (07/24 0629) Resp:  [18-19] 18 (07/24 0629) BP: (140-156)/(75-102) 149/75 (07/24 0629) SpO2:  [98 %-100 %] 100 % (07/24 0629) Last BM Date: 01/01/16  Intake/Output from previous day: 07/23 0701 - 07/24 0700 In: 1480 [P.O.:480; IV Piggyback:1000] Out: -  Intake/Output this shift: No intake/output data recorded.  PE: Gen:  Alert, NAD, pleasant Card:  RRR, no M/G/R heard Pulm:  CTA, no W/R/R Abd: Soft, NT/ND, +BS GU: 3-4 cm open wound, granulation tissue, no slough. Healing appropriately  Lab Results:   Recent Labs  01/03/16 0210 01/04/16 0511  WBC 11.6* 5.7  HGB 12.9* 12.9*  HCT 37.4* 37.2*  PLT 237 242   BMET  Recent Labs  01/03/16 0210 01/04/16 0511  NA 137 136  K 3.1* 3.6  CL 103 102  CO2 28 27  GLUCOSE 108* 162*  BUN 8 6  CREATININE 0.82 0.92  CALCIUM 8.9 9.1   PT/INR No results for input(s): LABPROT, INR in the last 72 hours. CMP     Component Value Date/Time   NA 136 01/04/2016 0511   K 3.6 01/04/2016 0511   CL 102 01/04/2016 0511   CO2 27 01/04/2016 0511   GLUCOSE 162 (H) 01/04/2016 0511   BUN 6 01/04/2016 0511   CREATININE 0.92 01/04/2016 0511   CALCIUM 9.1 01/04/2016 0511   PROT 7.2 01/02/2016 1050   ALBUMIN 4.1 01/02/2016 1050   AST 19 01/02/2016 1050   ALT 14 (L) 01/02/2016 1050   ALKPHOS 68 01/02/2016 1050   BILITOT 1.4 (H) 01/02/2016 1050   GFRNONAA >60 01/04/2016 0511   GFRAA >60 01/04/2016 0511   Lipase     Component Value Date/Time   LIPASE 15 12/09/2015 1115   Studies/Results: Ct Pelvis W Contrast  Result Date: 01/02/2016 CLINICAL DATA:  Boil in middle of buttocks for 3 days, ruptured today. EXAM: CT PELVIS WITH CONTRAST TECHNIQUE: Multidetector CT imaging of the  pelvis was performed using the standard protocol following the bolus administration of intravenous contrast. CONTRAST:  ISOVUE-300 IOPAMIDOL (ISOVUE-300) INJECTION 61% COMPARISON:  None. FINDINGS: Ill-defined edema within the subcutaneous soft tissues on either side of the gluteal fold. Phlegmonous like soft tissue thickening also noted along the gluteal fold. No circumscribed abscess collection seen. Intrapelvic soft tissues are unremarkable. No intrapelvic abscess or free fluid. Pelvic portion of the bowel appears normal. No free intraperitoneal air. No acute- appearing osseous abnormality. IMPRESSION: Ill-defined edema within the subcutaneous soft tissues on either side of the gluteal fold. Additional phlegmonous-like soft tissue thickening along the left lateral aspect of the gluteal fold. No circumscribed fluid collection or abscess-like collection seen. No intrapelvic abscess collection or free fluid. Visualized portions of the bowel appear normal. Electronically Signed   By: Bary Richard M.D.   On: 01/02/2016 12:37   US Scrotum  Result Date: 01/02/2016 CLINICAL DATA:  Scrotal abscess. EXAM: ULTRASOUND OF SCROTUM TECHNIQUE: Complete ultrasound examination of the testicles, epididymis, and other scrotal structures was performed. COMPARISON:  CT abdomen pelvis 01/02/2016. FINDINGS: Right testicle Measurements: 4.5 x 2.7 x 3.0 cm. No mass or microlithiasis. Color Doppler flow is identified. Left testicle Measurements: 4.1 x 2.3 x 3.0 cm. No mass or microlithiasis. Color Doppler flow is identified. Right epididymis:  Normal  in size and appearance. Left epididymis:  8 mm anechoic epididymal cyst or spermatocele. Hydrocele:  Small bilateral. Varicocele:  None visualized. Other: Scrotal and perineal soft tissue thickening with a complex fluid collection along the right perineum, measuring approximately 3.4 x 1.5 x 1.9 cm. IMPRESSION: 1. Complex scrotal/perineal fluid collection is indicative of an abscess.  2. Small bilateral hydroceles. Electronically Signed   By: Leanna Battles M.D.   On: 01/02/2016 14:24    Anti-infectives: Anti-infectives    Start     Dose/Rate Route Frequency Ordered Stop   01/02/16 2200  vancomycin (VANCOCIN) IVPB 1000 mg/200 mL premix     1,000 mg 200 mL/hr over 60 Minutes Intravenous Every 8 hours 01/02/16 1802     01/02/16 1830  piperacillin-tazobactam (ZOSYN) IVPB 3.375 g     3.375 g 12.5 mL/hr over 240 Minutes Intravenous Every 8 hours 01/02/16 1802     01/02/16 1600  vancomycin (VANCOCIN) IVPB 1000 mg/200 mL premix  Status:  Discontinued     1,000 mg 200 mL/hr over 60 Minutes Intravenous  Once 01/02/16 1550 01/02/16 1724   01/02/16 1554  vancomycin (VANCOCIN) 1-5 GM/200ML-% IVPB    Comments:  Shireen Quan   : cabinet override      01/02/16 1554 01/02/16 1617   01/02/16 1030  clindamycin (CLEOCIN) IVPB 600 mg     600 mg 100 mL/hr over 30 Minutes Intravenous  Once 01/02/16 1029 01/02/16 1117       Assessment/Plan Perirectal abscess - s/p I&D Dr. Derrell Lolling 01/02/16 - WBC 5.7 this AM  FEN: regular, carb modified ID: [id number] day #2 Plan: from a surgical standpoint the pt may be discharged home with daily dressing changes, cipro/flagyl x 8 days, and follow-up in our office in one week. I have placed his follow-up information and prescription in his discharge instructions.    LOS: 1 day    Adam Phenix , [facility] 01/04/2016, 7:46 AM Pager: [phone] Consults: [phone] Mon-Fri 7:00 am-4:30 pm Sat-Sun 7:00 am-11:30 am

## 2016-01-05 LAB — GLUCOSE, CAPILLARY: GLUCOSE-CAPILLARY: 183 mg/dL — AB (ref 65–99)

## 2016-01-05 MED ORDER — LEVOFLOXACIN 750 MG PO TABS: 750.0000 mg | ORAL_TABLET | Freq: Every day | ORAL | 0 refills | Status: DC

## 2016-01-05 MED ORDER — AMLODIPINE BESYLATE 5 MG PO TABS: 5.0000 mg | ORAL_TABLET | Freq: Every day | ORAL | 1 refills | Status: DC

## 2016-01-05 NOTE — Progress Notes (Signed)
Pt ready for discharge home. Pt. Is alert and oriented. Pt is hemodynamically stable. IV removed. AVS reviewed with pt. Capable of re verbalizing medication regimen. Discharge plan appropriate and in place.

## 2016-01-07 LAB — AEROBIC/ANAEROBIC CULTURE (SURGICAL/DEEP WOUND)

## 2016-01-07 LAB — CULTURE, BLOOD (ROUTINE X 2)
CULTURE: NO GROWTH
Culture: NO GROWTH

## 2016-01-07 LAB — AEROBIC/ANAEROBIC CULTURE W GRAM STAIN (SURGICAL/DEEP WOUND)

## 2016-01-11 ENCOUNTER — Emergency Department (HOSPITAL_BASED_OUTPATIENT_CLINIC_OR_DEPARTMENT_OTHER)
Admission: EM | Admit: 2016-01-11 | Discharge: 2016-01-11 | Disposition: A | Discharge status: Home / Self Care | Payer: Managed Care, Other (non HMO) | Attending: Emergency Medicine | Admitting: Emergency Medicine

## 2016-01-11 ENCOUNTER — Encounter (HOSPITAL_BASED_OUTPATIENT_CLINIC_OR_DEPARTMENT_OTHER): Payer: Self-pay | Admitting: *Deleted

## 2016-01-11 ENCOUNTER — Emergency Department (HOSPITAL_BASED_OUTPATIENT_CLINIC_OR_DEPARTMENT_OTHER): Payer: Managed Care, Other (non HMO)

## 2016-01-11 DIAGNOSIS — Z794 Long term (current) use of insulin: Secondary | ICD-10-CM | POA: Insufficient documentation

## 2016-01-11 DIAGNOSIS — S46911A Strain of unspecified muscle, fascia and tendon at shoulder and upper arm level, right arm, initial encounter: Secondary | ICD-10-CM | POA: Insufficient documentation

## 2016-01-11 DIAGNOSIS — I10 Essential (primary) hypertension: Secondary | ICD-10-CM | POA: Insufficient documentation

## 2016-01-11 DIAGNOSIS — Y939 Activity, unspecified: Secondary | ICD-10-CM | POA: Insufficient documentation

## 2016-01-11 DIAGNOSIS — Y999 Unspecified external cause status: Secondary | ICD-10-CM | POA: Insufficient documentation

## 2016-01-11 DIAGNOSIS — E119 Type 2 diabetes mellitus without complications: Secondary | ICD-10-CM | POA: Insufficient documentation

## 2016-01-11 DIAGNOSIS — Z79899 Other long term (current) drug therapy: Secondary | ICD-10-CM | POA: Insufficient documentation

## 2016-01-11 DIAGNOSIS — Y929 Unspecified place or not applicable: Secondary | ICD-10-CM | POA: Insufficient documentation

## 2016-01-11 MED ORDER — NAPROXEN 500 MG PO TABS: 500.0000 mg | ORAL_TABLET | Freq: Two times a day (BID) | ORAL | 0 refills | Status: DC

## 2016-01-11 MED ORDER — METHOCARBAMOL 500 MG PO TABS: 500.0000 mg | ORAL_TABLET | Freq: Three times a day (TID) | ORAL | 0 refills | Status: DC | PRN

## 2016-01-11 MED ORDER — HYDROCODONE-ACETAMINOPHEN 5-325 MG PO TABS
1.0000 | ORAL_TABLET | Freq: Once | ORAL | Status: AC
  Administered 2016-01-11: 1 via ORAL
  Filled 2016-01-11: qty 1
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
## 2016-01-11 NOTE — Discharge Instructions (Signed)
Ice as needed.  Use as tolerated.  Medications as prescribed.
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
## 2016-02-09 ENCOUNTER — Ambulatory Visit (INDEPENDENT_AMBULATORY_CARE_PROVIDER_SITE_OTHER): Payer: Self-pay | Admitting: Internal Medicine

## 2016-02-09 ENCOUNTER — Telehealth: Payer: Self-pay | Admitting: *Deleted

## 2016-02-09 DIAGNOSIS — Z87891 Personal history of nicotine dependence: Secondary | ICD-10-CM

## 2016-02-09 DIAGNOSIS — Z79899 Other long term (current) drug therapy: Secondary | ICD-10-CM

## 2016-02-09 DIAGNOSIS — I10 Essential (primary) hypertension: Secondary | ICD-10-CM

## 2016-02-09 DIAGNOSIS — Z794 Long term (current) use of insulin: Secondary | ICD-10-CM

## 2016-02-09 DIAGNOSIS — E119 Type 2 diabetes mellitus without complications: Secondary | ICD-10-CM

## 2016-02-09 LAB — GLUCOSE, CAPILLARY: GLUCOSE-CAPILLARY: 184 mg/dL — AB (ref 65–99)

## 2016-02-09 MED ORDER — BENAZEPRIL HCL 20 MG PO TABS: 20.0000 mg | ORAL_TABLET | Freq: Every day | ORAL | 3 refills | Status: DC

## 2016-02-09 MED ORDER — METFORMIN HCL ER 500 MG PO TB24: 500.0000 mg | ORAL_TABLET | Freq: Every day | ORAL | 11 refills | Status: DC

## 2016-02-09 MED ORDER — INSULIN GLARGINE 100 UNIT/ML SOLOSTAR PEN: 24.0000 [IU] | PEN_INJECTOR | SUBCUTANEOUS | 0 refills | Status: DC

## 2016-02-09 MED ORDER — ATORVASTATIN CALCIUM 40 MG PO TABS: 40.0000 mg | ORAL_TABLET | Freq: Every day | ORAL | 1 refills | Status: DC

## 2016-02-09 MED ORDER — INSULIN ASPART 100 UNIT/ML ~~LOC~~ SOLN: 3.0000 [IU] | Freq: Three times a day (TID) | SUBCUTANEOUS | 11 refills | Status: DC

## 2016-02-09 NOTE — Patient Instructions (Signed)
Thank you for your visit today  Please continue Lantus 24 units daily. Please start taking Novolog 3 units daily with meals- take it right before a meal.  Please start taking the metformin daily  Please start taking the atorvastatin medicine daily   Please follow up in 1 month

## 2016-02-09 NOTE — Assessment & Plan Note (Signed)
>>  ASSESSMENT AND PLAN FOR DIABETES MELLITUS TYPE 2, INSULIN  DEPENDENT (HCC) WRITTEN ON 02/09/2016  4:56 PM BY Burnell Carry, MD  Lab Results  Component Value Date   HGBA1C 10.0 (H) 01/02/2016    Pt said he was diagnosed with diabetes about 15 years ago and has been on oral medications since then including metformin , but few months ago had to start taking insulin . He is brand new to our clinic. He has been taking 28 units of Lantus  daily. Last A1c is 10 in July. He denies any neuropathy. He brought his meter which shows some postprandial elevation of CBGs, and one fasting number of 69 after he increased the lantus  from 24 units to 28 units. He denies any hypoglycemia episodes and knows what to do in the event it happens.   A: uncontrolled insulin  dependent type 2 diabetes  Plan -Lantus  24 units daily -Added Novolog  three times a day with meals -Added metformin  ER 500 mg daily -added atorvastatin  40 mg daily -referral to Ms Abe Abed  -follow up in 1 month

## 2016-02-09 NOTE — Assessment & Plan Note (Signed)
Lab Results  Component Value Date   HGBA1C 10.0 (H) 01/02/2016    Pt said he was diagnosed with diabetes about 15 years ago and has been on oral medications since then including metformin, but few months ago had to start taking insulin. He is brand new to our clinic. He has been taking 28 units of Lantus daily. Last A1c is 10 in July. He denies any neuropathy. He brought his meter which shows some postprandial elevation of CBGs, and one fasting number of 69 after he increased the lantus from 24 units to 28 units. He denies any hypoglycemia episodes and knows what to do in the event it happens.   A: uncontrolled insulin dependent type 2 diabetes  Plan -Lantus 24 units daily -Added Novolog three times a day with meals -Added metformin ER 500 mg daily -added atorvastatin 40 mg daily -referral to Ms Lupita LeashDonna  -follow up in 1 month

## 2016-02-09 NOTE — Assessment & Plan Note (Signed)
BP Readings from Last 3 Encounters:  02/09/16 135/78  01/11/16 (!) 165/101  01/05/16 (!) 153/97   Patient's blood pressure is at goal today 135/78. He is on benazepril 20 mg daily, and he was put on amlodipine in the hospital due to elevated blood pressures but he has not been taking it.   A: HTN at goal  Plan -Given that his BP is at goal on benazepril only, we will continue the benazepril daily and not add amlodipine -follow up in 3 months

## 2016-02-09 NOTE — Progress Notes (Signed)
    CC: hypertension and diabetes HPI: [name] is a 40 y.o. man with PMH noted below here for diabetes and hypertension   Please see Problem List/A&P for the status of the patient's chronic medical problems   Past Medical History:  Diagnosis Date  . Diabetes mellitus   . Hypertension     Review of Systems: Denies fevers, chills, fatigue Denies hypoglycemia symptoms Denies n/v/abd pain,diarrhea  Physical Exam: Vitals:   02/09/16 1533  BP: 135/78  Pulse: 87  Temp: 98.6 F (37 C)  TempSrc: Oral  SpO2: 100%  Weight: 202 lb 3.2 oz (91.7 kg)  Height: 6\' 2"  (1.88 m)    General: A&O, in NAD CV: RRR, normal s1, s2, no m/r/g,  Resp: equal and symmetric breath sounds, no wheezing or crackles  Abdomen: soft, nontender, nondistended, +BS   Assessment & Plan:   See encounters tab for problem based medical decision making. Patient discussed with Dr. Josem KaufmannKlima

## 2016-02-09 NOTE — Telephone Encounter (Signed)
[facility] Solostar .  Directions inject 24 units into the skin every morning.  Dispense 15 ml.  No refills.  Atorvastatin 40 mg tablets # 90 with 1 refill.  Novalog Insulin 100 unit/ml.   Dispense 10 ml.  Inject 3 units into the skin 3 times daily before meals.  11 refills ordered.  Metformin 500 mg 24 hour tablets by mouth daily with breakfast. Dispense 30 tablets . 11 refills.  Order per Dr. Johnny BridgeSaraiya.  Angelina OkGladys Kyeisha Janowicz, RN 02/09/2016 4:50 PM

## 2016-02-11 NOTE — Progress Notes (Signed)
Case discussed with Dr. Saraiya at the time of the visit. We reviewed the resident's history and exam and pertinent patient test results. I agree with the assessment, diagnosis, and plan of care documented in the resident's note. 

## 2016-02-16 ENCOUNTER — Ambulatory Visit: Payer: Self-pay | Admitting: Dietician

## 2016-02-18 ENCOUNTER — Ambulatory Visit: Payer: Self-pay | Admitting: Dietician

## 2016-02-18 ENCOUNTER — Encounter: Payer: Self-pay | Admitting: Dietician

## 2016-02-19 ENCOUNTER — Other Ambulatory Visit: Payer: Self-pay | Admitting: Pharmacist

## 2016-02-19 DIAGNOSIS — I10 Essential (primary) hypertension: Secondary | ICD-10-CM

## 2016-02-19 MED ORDER — BENAZEPRIL HCL 20 MG PO TABS: 20.0000 mg | ORAL_TABLET | Freq: Every day | ORAL | 3 refills | Status: DC

## 2016-03-17 NOTE — Addendum Note (Signed)
Addended by: Neomia DearPOWERS, Demeco Ducksworth E on: 03/17/2016 08:07 PM   Modules accepted: Orders

## 2016-05-10 ENCOUNTER — Other Ambulatory Visit: Payer: Self-pay | Admitting: *Deleted

## 2016-05-30 ENCOUNTER — Telehealth: Payer: Self-pay

## 2016-05-30 ENCOUNTER — Ambulatory Visit (INDEPENDENT_AMBULATORY_CARE_PROVIDER_SITE_OTHER): Payer: Self-pay | Admitting: Pulmonary Disease

## 2016-05-30 ENCOUNTER — Encounter (INDEPENDENT_AMBULATORY_CARE_PROVIDER_SITE_OTHER): Payer: Self-pay

## 2016-05-30 VITALS — BP 157/79 | HR 81 | Temp 98.2°F | Ht 74.0 in | Wt 218.2 lb

## 2016-05-30 DIAGNOSIS — F17211 Nicotine dependence, cigarettes, in remission: Secondary | ICD-10-CM

## 2016-05-30 DIAGNOSIS — E1165 Type 2 diabetes mellitus with hyperglycemia: Secondary | ICD-10-CM

## 2016-05-30 DIAGNOSIS — Z9114 Patient's other noncompliance with medication regimen: Secondary | ICD-10-CM

## 2016-05-30 DIAGNOSIS — I10 Essential (primary) hypertension: Secondary | ICD-10-CM

## 2016-05-30 DIAGNOSIS — E119 Type 2 diabetes mellitus without complications: Secondary | ICD-10-CM

## 2016-05-30 DIAGNOSIS — Z79899 Other long term (current) drug therapy: Secondary | ICD-10-CM

## 2016-05-30 DIAGNOSIS — Z794 Long term (current) use of insulin: Secondary | ICD-10-CM

## 2016-05-30 LAB — GLUCOSE, CAPILLARY: GLUCOSE-CAPILLARY: 220 mg/dL — AB (ref 65–99)

## 2016-05-30 LAB — POCT GLYCOSYLATED HEMOGLOBIN (HGB A1C): HEMOGLOBIN A1C: 10

## 2016-05-30 MED ORDER — ATORVASTATIN CALCIUM 40 MG PO TABS: 40.0000 mg | ORAL_TABLET | Freq: Every day | ORAL | 1 refills | Status: DC

## 2016-05-30 MED ORDER — GLYBURIDE-METFORMIN 5-500 MG PO TABS: 1.0000 | ORAL_TABLET | Freq: Every day | ORAL | 0 refills | Status: DC

## 2016-05-30 MED ORDER — INSULIN ASPART 100 UNIT/ML ~~LOC~~ SOLN: 3.0000 [IU] | Freq: Three times a day (TID) | SUBCUTANEOUS | 1 refills | Status: DC

## 2016-05-30 MED ORDER — BENAZEPRIL HCL 20 MG PO TABS: 20.0000 mg | ORAL_TABLET | Freq: Every day | ORAL | 1 refills | Status: DC

## 2016-05-30 MED ORDER — INSULIN GLARGINE 100 UNIT/ML SOLOSTAR PEN: 24.0000 [IU] | PEN_INJECTOR | SUBCUTANEOUS | 1 refills | Status: DC

## 2016-05-30 NOTE — Progress Notes (Signed)
   CC: Diabetes  HPI:  [name] is a 40 y.o. man with DM2 and HTN presenting for follow up of his DM2.  He checks his blood sugar twice a day in the morning and at night. He had a blood sugar of 60-70 last week. Only one other episode before. He takes Novolog 5 units with meals. He has missed some doses of Novolog. He takes Lantus 24units daily but has not had any in 2 weeks because he ran out.   Usually blood sugar is between 190-210.   Last had benazepril over a week ago.   Past Medical History:  Diagnosis Date  . Diabetes mellitus 2002  . Hypertension     Review of Systems:   No fevers/chills No chest pain No dyspnea  Physical Exam:  Vitals:   05/30/16 1522  BP: (!) 157/79  Pulse: 81  Temp: 98.2 F (36.8 C)  TempSrc: Oral  SpO2: 100%  Weight: 218 lb 3.2 oz (99 kg)  Height: 6\' 2"  (1.88 m)   General Apperance: NAD HEENT: Normocephalic, atraumatic, anicteric sclera Neck: Supple, trachea midline Lungs: Clear to auscultation bilaterally Heart: Regular rate and rhythm Abdomen: Soft, nontender, nondistended, no rebound/guarding Extremities: Warm and well perfused, no edema Skin: No rashes or lesions Neurologic: Alert and interactive. No gross deficits.   Assessment & Plan:   See Encounters Tab for problem based charting.  Patient discussed with Dr. Criselda PeachesMullen

## 2016-05-30 NOTE — Telephone Encounter (Signed)
Pharmacy called about Lantus  Was not electronically sent verbal given as directed-

## 2016-05-30 NOTE — Patient Instructions (Signed)
Take your metformin combo pill daily with the first meal of the day Take the rest of your medications as prescribed Follow up in 3-4 weeks with your meter

## 2016-05-31 NOTE — Assessment & Plan Note (Signed)
>>  ASSESSMENT AND PLAN FOR DIABETES MELLITUS TYPE 2, INSULIN  DEPENDENT (HCC) WRITTEN ON 05/31/2016 11:12 AM BY KRALL, JENNIFER T, MD  Assessment: Hgb A1c remains uncontrolled and unchanged at 10%. Medication noncompliance and has been out of Lantus  for at least 2 weeks.  Plan:  Restart Lantus  24u daily Continue Novolog  with meals Change metformin  ER to glyburide -metformin  5-500mg  Follow up in 4 to 6 weeks with meter

## 2016-05-31 NOTE — Assessment & Plan Note (Signed)
Assessment: BP uncontrolled but has been out of his benazepril for over a week. BP today 157/79  Plan: Refilled benazepril 20mg  daily Follow up in 4 to 6 weeks and recheck BMP at that time

## 2016-05-31 NOTE — Assessment & Plan Note (Signed)
Assessment: Hgb A1c remains uncontrolled and unchanged at 10%. Medication noncompliance and has been out of Lantus for at least 2 weeks.  Plan:  Restart Lantus 24u daily Continue Novolog with meals Change metformin ER to glyburide-metformin 5-500mg  Follow up in 4 to 6 weeks with meter

## 2016-06-03 NOTE — Progress Notes (Signed)
Internal Medicine Clinic Attending  Case discussed with Dr. Krall soon after the resident saw the patient.  We reviewed the resident's history and exam and pertinent patient test results.  I agree with the assessment, diagnosis, and plan of care documented in the resident's note. 

## 2016-07-30 ENCOUNTER — Encounter (HOSPITAL_BASED_OUTPATIENT_CLINIC_OR_DEPARTMENT_OTHER): Payer: Self-pay | Admitting: *Deleted

## 2016-07-30 ENCOUNTER — Emergency Department (HOSPITAL_BASED_OUTPATIENT_CLINIC_OR_DEPARTMENT_OTHER): Payer: Self-pay

## 2016-07-30 ENCOUNTER — Emergency Department (HOSPITAL_BASED_OUTPATIENT_CLINIC_OR_DEPARTMENT_OTHER)
Admission: EM | Admit: 2016-07-30 | Discharge: 2016-07-30 | Disposition: A | Discharge status: Home / Self Care | Payer: Self-pay | Attending: Physician Assistant | Admitting: Physician Assistant

## 2016-07-30 DIAGNOSIS — Y939 Activity, unspecified: Secondary | ICD-10-CM | POA: Insufficient documentation

## 2016-07-30 DIAGNOSIS — Z87891 Personal history of nicotine dependence: Secondary | ICD-10-CM | POA: Insufficient documentation

## 2016-07-30 DIAGNOSIS — Y999 Unspecified external cause status: Secondary | ICD-10-CM | POA: Insufficient documentation

## 2016-07-30 DIAGNOSIS — I10 Essential (primary) hypertension: Secondary | ICD-10-CM | POA: Insufficient documentation

## 2016-07-30 DIAGNOSIS — S02652A Fracture of angle of left mandible, initial encounter for closed fracture: Secondary | ICD-10-CM | POA: Insufficient documentation

## 2016-07-30 DIAGNOSIS — Z79899 Other long term (current) drug therapy: Secondary | ICD-10-CM | POA: Insufficient documentation

## 2016-07-30 DIAGNOSIS — E119 Type 2 diabetes mellitus without complications: Secondary | ICD-10-CM | POA: Insufficient documentation

## 2016-07-30 DIAGNOSIS — Y929 Unspecified place or not applicable: Secondary | ICD-10-CM | POA: Insufficient documentation

## 2016-07-30 DIAGNOSIS — Z794 Long term (current) use of insulin: Secondary | ICD-10-CM | POA: Insufficient documentation

## 2016-07-30 MED ORDER — OXYCODONE-ACETAMINOPHEN 5-325 MG PO TABS: 1.0000 | ORAL_TABLET | ORAL | 0 refills | Status: DC | PRN
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
## 2016-07-30 NOTE — Discharge Instructions (Signed)
Take the prescribed medication as directed.  Recommend soft diet for now. Follow-up with Dr. Lazarus SalinesWolicki-- call his office on Monday for appt. Return to the ED for new or worsening symptoms.
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
## 2016-07-30 NOTE — ED Notes (Signed)
Given ice pack

## 2016-07-30 NOTE — ED Notes (Signed)
Pt teaching provided on medications that may cause drowsiness. Pt instructed not to drive or operate heavy machinery while taking the prescribed medication. Pt verbalized understanding.   
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
## 2016-09-14 ENCOUNTER — Other Ambulatory Visit: Payer: Self-pay

## 2016-09-14 DIAGNOSIS — I10 Essential (primary) hypertension: Secondary | ICD-10-CM

## 2016-09-14 NOTE — Telephone Encounter (Signed)
Requesting bp med to be filled. Please call pt back.  

## 2016-09-15 MED ORDER — BENAZEPRIL HCL 20 MG PO TABS: 20.0000 mg | ORAL_TABLET | Freq: Every day | ORAL | 0 refills | Status: DC

## 2016-09-15 NOTE — Telephone Encounter (Signed)
Pt needs follow up appt to recheck blood pressure

## 2016-09-16 ENCOUNTER — Encounter (INDEPENDENT_AMBULATORY_CARE_PROVIDER_SITE_OTHER): Payer: Self-pay

## 2016-09-16 ENCOUNTER — Ambulatory Visit (INDEPENDENT_AMBULATORY_CARE_PROVIDER_SITE_OTHER): Payer: Self-pay | Admitting: Internal Medicine

## 2016-09-16 VITALS — BP 151/92 | HR 93 | Temp 99.0°F | Ht 74.0 in | Wt 205.2 lb

## 2016-09-16 DIAGNOSIS — E1165 Type 2 diabetes mellitus with hyperglycemia: Secondary | ICD-10-CM

## 2016-09-16 DIAGNOSIS — F17211 Nicotine dependence, cigarettes, in remission: Secondary | ICD-10-CM

## 2016-09-16 DIAGNOSIS — Z794 Long term (current) use of insulin: Secondary | ICD-10-CM

## 2016-09-16 DIAGNOSIS — E119 Type 2 diabetes mellitus without complications: Secondary | ICD-10-CM

## 2016-09-16 DIAGNOSIS — Z79899 Other long term (current) drug therapy: Secondary | ICD-10-CM

## 2016-09-16 DIAGNOSIS — I10 Essential (primary) hypertension: Secondary | ICD-10-CM

## 2016-09-16 LAB — POCT GLYCOSYLATED HEMOGLOBIN (HGB A1C): Hemoglobin A1C: 9.3

## 2016-09-16 LAB — GLUCOSE, CAPILLARY: Glucose-Capillary: 209 mg/dL — ABNORMAL HIGH (ref 65–99)

## 2016-09-16 MED ORDER — INSULIN ASPART 100 UNIT/ML ~~LOC~~ SOLN: 5.0000 [IU] | Freq: Three times a day (TID) | SUBCUTANEOUS | 1 refills | Status: DC

## 2016-09-16 MED ORDER — GLYBURIDE-METFORMIN 5-500 MG PO TABS: 1.0000 | ORAL_TABLET | Freq: Every day | ORAL | 2 refills | Status: DC

## 2016-09-16 MED ORDER — INSULIN GLARGINE 100 UNIT/ML SOLOSTAR PEN: 24.0000 [IU] | PEN_INJECTOR | SUBCUTANEOUS | 1 refills | Status: DC

## 2016-09-16 MED ORDER — ATORVASTATIN CALCIUM 40 MG PO TABS: 40.0000 mg | ORAL_TABLET | Freq: Every day | ORAL | 1 refills | Status: DC

## 2016-09-16 MED ORDER — INSULIN ASPART 100 UNIT/ML ~~LOC~~ SOLN: 3.0000 [IU] | Freq: Three times a day (TID) | SUBCUTANEOUS | 1 refills | Status: DC

## 2016-09-16 MED ORDER — GLUCOSE BLOOD VI STRP: ORAL_STRIP | 12 refills | Status: DC

## 2016-09-16 MED ORDER — AMLODIPINE BESYLATE 5 MG PO TABS: 5.0000 mg | ORAL_TABLET | Freq: Every day | ORAL | 2 refills | Status: DC

## 2016-09-16 MED ORDER — BENAZEPRIL HCL 20 MG PO TABS: 20.0000 mg | ORAL_TABLET | Freq: Every day | ORAL | 1 refills | Status: DC

## 2016-09-16 NOTE — Patient Instructions (Addendum)
General Instructions: - Start Amlodipine 5 mg daily - Start Glyburide-metformin 5-500 mg daily for diabetes - Referral for podiatry - Follow up in 2 weeks to recheck blood pressure and blood glucose-  Please bring your glucometer!  Please bring your medicines with you each time you come to clinic.  Medicines may include prescription medications, over-the-counter medications, herbal remedies, eye drops, vitamins, or other pills.   Progress Toward Treatment Goals:  No flowsheet data found.  Self Care Goals & Plans:  No flowsheet data found.  No flowsheet data found.   Care Management & Community Referrals:  No flowsheet data found.

## 2016-09-16 NOTE — Progress Notes (Signed)
   CC: Hypertension follow-up  HPI:  Carlos [name] is a 41 y.o. man with past medical history as noted below who presents today for follow-up of his hypertension.  HTN: BP today is elevated at 151/92. Patient is on benazepril 20 mg daily. However, he reports he ran out of this medication 1.5 weeks ago. He notes his BP was high even while on the benazepril in the 150s systolic at home.   Uncontrolled Type 2 diabetes: Last A1c was 10.0. Today his A1c was 9.3. He is taking Lantus 24 units at bedtime and NovoLog 5 units 3 times daily with meals. He has not started the glyburide-metformin medication. He did not bring in his glucometer today because it has been in his car, which has been in the shop for the last month. He denies any blurry vision, polydipsia, polyuria, or tingling/numbness in his feet. He denies any sores on his feet. He would like to be seen by podiatry to have his toenails cut.  Past Medical History:  Diagnosis Date  . Diabetes mellitus 2002  . Hypertension     Review of Systems:   General: Denies fever, chills, night sweats, changes in weight, changes in appetite HEENT: Denies headaches, ear pain, changes in vision, rhinorrhea, sore throat CV: Denies CP, palpitations, SOB, orthopnea Pulm: Denies SOB, cough, wheezing GI: Denies abdominal pain, nausea, vomiting, diarrhea, constipation, melena, hematochezia GU: Denies dysuria, hematuria Msk: Denies muscle cramps, joint pains Neuro: Denies weakness Skin: Denies rashes, bruising Psych: Denies depression, anxiety, hallucinations  Physical Exam:  Vitals:   09/16/16 1322  BP: (!) 151/92  Pulse: 93  Temp: 99 F (37.2 C)  TempSrc: Oral  SpO2: 100%  Weight: 205 lb 3.2 oz (93.1 kg)  Height:  (1.88 m)   General: Well-nourished man in NAD CV: RRR, no m/g/r Pulm: CTA bilaterally, breaths non-labored Abd: BS+, soft, non-tender Ext: warm, no peripheral edema. Distal pulses 2+. No sores present on feet. Several  toenails are long with onychomycosis.  Neuro: alert and oriented x 3  Assessment & Plan:   See Encounters Tab for problem based charting.  Patient discussed with Dr. Cleda Daub

## 2016-09-16 NOTE — Assessment & Plan Note (Addendum)
His A1c slightly improved from 10.0 to 9.3, but this is still uncontrolled. He did not start the glyburide-metformin medication that was prescribed last visit. He self increased his NovoLog from 3 to 5 units 3 times daily with meals. He did not bring his glucometer today and has not been testing for the last month as he has been without his meter so unable to assess his blood glucose control. Will have him continue Lantus 24 units at bedtime and NovoLog 5 units 3 times daily with meals. These medications were refilled and he was given a paper prescription for his Lantus as he is able to get [facility]. He will start glyburide-metformin 5-500 milligrams daily. I also refilled his atorvastatin and test strips today. Referral made to podiatry for his toenails. He will follow up in 2 weeks for evaluation of his blood glucose control. He was asked to bring his glucometer.

## 2016-09-16 NOTE — Assessment & Plan Note (Signed)
BP is elevated today, but patient has been off of his medications for the last 1.5 weeks. He noted that his blood pressure was even high while on the medication. Will have him restart benazepril 20 mg daily and start amlodipine 5 mg daily. He has been on amlodipine before and tolerated this well. He will follow up in 2 weeks for BP recheck and BMP.

## 2016-09-16 NOTE — Assessment & Plan Note (Signed)
>>  ASSESSMENT AND PLAN FOR DIABETES MELLITUS TYPE 2, INSULIN  DEPENDENT (HCC) WRITTEN ON 09/16/2016  2:03 PM BY RIVET, CARLY J, MD  His A1c slightly improved from 10.0 to 9.3, but this is still uncontrolled. He did not start the glyburide -metformin  medication that was prescribed last visit. He self increased his NovoLog  from 3 to 5 units 3 times daily with meals. He did not bring his glucometer today and has not been testing for the last month as he has been without his meter so unable to assess his blood glucose control. Will have him continue Lantus  24 units at bedtime and NovoLog  5 units 3 times daily with meals. These medications were refilled and he was given a paper prescription for his Lantus  as he is able to get this cheaper at the health department. He will start glyburide -metformin  5-500 milligrams daily. I also refilled his atorvastatin  and test strips today. Referral made to podiatry for his toenails. He will follow up in 2 weeks for evaluation of his blood glucose control. He was asked to bring his glucometer.

## 2016-09-20 NOTE — Progress Notes (Signed)
Internal Medicine Clinic Attending  Case discussed with Dr. Rivet at the time of the visit.  We reviewed the resident's history and exam and pertinent patient test results.  I agree with the assessment, diagnosis, and plan of care documented in the resident's note.  

## 2016-10-18 ENCOUNTER — Encounter: Payer: Self-pay | Admitting: Podiatry

## 2016-10-18 ENCOUNTER — Ambulatory Visit (INDEPENDENT_AMBULATORY_CARE_PROVIDER_SITE_OTHER): Payer: Self-pay | Admitting: Podiatry

## 2016-10-18 VITALS — Resp 16 | Ht 74.0 in | Wt 205.0 lb

## 2016-10-18 DIAGNOSIS — M79676 Pain in unspecified toe(s): Secondary | ICD-10-CM

## 2016-10-18 DIAGNOSIS — B351 Tinea unguium: Secondary | ICD-10-CM

## 2016-10-18 DIAGNOSIS — M2141 Flat foot [pes planus] (acquired), right foot: Secondary | ICD-10-CM

## 2016-10-18 DIAGNOSIS — M2010 Hallux valgus (acquired), unspecified foot: Secondary | ICD-10-CM

## 2016-10-18 DIAGNOSIS — M2142 Flat foot [pes planus] (acquired), left foot: Secondary | ICD-10-CM

## 2016-10-18 NOTE — Progress Notes (Signed)
   Subjective:    Patient ID: Carlos [name], male    DOB: 06/25/1975, 41 y.o.   MRN: [id number]  HPI this patient presents the office with chief complaint of long thick painful nails. She states that the nails are painful walking and wearing his shoes. Patient also states he is diabetic, type II. He says he is experiencing pain along the inside of the big toe on both feet. He says this is painful walking and wearing his shoes at work. He presents the office today for an evaluation and treatment of his nails and evaluation of his feet for possible diabetic shoes    Review of Systems  All other systems reviewed and are negative.      Objective:   Physical Exam GENERAL APPEARANCE: Alert, conversant. Appropriately groomed. No acute distress.  VASCULAR: Pedal pulses are  palpable at  [facility] and PT bilateral.  Capillary refill time is immediate to all digits,  Normal temperature gradient.  Digital hair growth is present bilateral  NEUROLOGIC: sensation is normal to 5.07 monofilament at 5/5 sites bilateral.  Light touch is intact bilateral, Muscle strength normal.  MUSCULOSKELETAL: acceptable muscle strength, tone and stability bilateral.  HAV 1st MPJ  B/L.  Flexible pes planus foot type.   DERMATOLOGIC: skin color, texture, and turgor are within normal limits.  No preulcerative lesions or ulcers  are seen, no interdigital maceration noted.  No open lesions present.  . No drainage noted. Pinch callus noted along the medial aspect of the great toes bilateral  Nails  thick disfigured discolored nails with subungual debris noted bilateral         Assessment & Plan:  Onychomycosis  HAV 1st MPJ  B/L  Pes planus.  IE  Debridement of nails.  Discussed the fact that the HAV deformities are leading to his pinch calluses   bilaterally. He also has a flexible pes planus. Upon standing told patient he would benefit from power step insoles prior to considering surgical correction of his  bunion  RTC prn       Helane GuntherGregory Summar Mcglothlin DPM

## 2017-02-02 ENCOUNTER — Other Ambulatory Visit: Payer: Self-pay | Admitting: *Deleted

## 2017-02-02 DIAGNOSIS — I10 Essential (primary) hypertension: Secondary | ICD-10-CM

## 2017-02-02 DIAGNOSIS — E119 Type 2 diabetes mellitus without complications: Secondary | ICD-10-CM

## 2017-02-02 DIAGNOSIS — Z794 Long term (current) use of insulin: Principal | ICD-10-CM

## 2017-02-02 MED ORDER — GLYBURIDE-METFORMIN 5-500 MG PO TABS: 1.0000 | ORAL_TABLET | Freq: Every day | ORAL | 1 refills | Status: DC

## 2017-02-02 MED ORDER — INSULIN ASPART 100 UNIT/ML ~~LOC~~ SOLN: 5.0000 [IU] | Freq: Three times a day (TID) | SUBCUTANEOUS | 2 refills | Status: DC

## 2017-02-02 MED ORDER — AMLODIPINE BESYLATE 5 MG PO TABS: 5.0000 mg | ORAL_TABLET | Freq: Every day | ORAL | 0 refills | Status: DC

## 2017-02-02 MED ORDER — INSULIN GLARGINE 100 UNIT/ML SOLOSTAR PEN: 24.0000 [IU] | PEN_INJECTOR | SUBCUTANEOUS | 2 refills | Status: DC

## 2017-02-02 MED ORDER — PEN NEEDLES 31G X 5 MM MISC: 1.0000 | Freq: Every day | 2 refills | Status: DC

## 2017-02-02 NOTE — Telephone Encounter (Signed)
Please call him about possible PA, thanks gladys

## 2017-02-02 NOTE — Telephone Encounter (Signed)
Did the patient receive lantus or levemir? [facility] patients. thanks

## 2017-02-02 NOTE — Telephone Encounter (Signed)
Pl ask pt to keep sept appt. Also, pl advise pt to request medicines before he completely runs out.  thanks

## 2017-02-02 NOTE — Telephone Encounter (Signed)
Needs to speak with a nurse about insurance not covering lantus. Please call back.

## 2017-02-03 MED ORDER — INSULIN DETEMIR 100 UNIT/ML FLEXPEN: 24.0000 [IU] | PEN_INJECTOR | Freq: Every day | SUBCUTANEOUS | 11 refills | Status: DC

## 2017-02-03 NOTE — Telephone Encounter (Signed)
I have sent the Levemir to his pharmacy thanks

## 2017-02-15 ENCOUNTER — Ambulatory Visit: Payer: Self-pay

## 2017-02-15 ENCOUNTER — Encounter: Payer: Self-pay | Admitting: Internal Medicine

## 2017-02-28 ENCOUNTER — Other Ambulatory Visit: Payer: Self-pay | Admitting: *Deleted

## 2017-02-28 DIAGNOSIS — E119 Type 2 diabetes mellitus without complications: Secondary | ICD-10-CM

## 2017-02-28 DIAGNOSIS — Z794 Long term (current) use of insulin: Principal | ICD-10-CM

## 2017-03-02 MED ORDER — GLYBURIDE-METFORMIN 5-500 MG PO TABS: 1.0000 | ORAL_TABLET | Freq: Every day | ORAL | 0 refills | Status: DC

## 2017-03-02 NOTE — Telephone Encounter (Signed)
Pt needs appt for further refilles Giving 30 day supply

## 2017-03-02 NOTE — Telephone Encounter (Signed)
Please schedule an appt for this pt. Thanks

## 2017-03-27 ENCOUNTER — Ambulatory Visit: Payer: Self-pay

## 2017-05-10 ENCOUNTER — Ambulatory Visit (INDEPENDENT_AMBULATORY_CARE_PROVIDER_SITE_OTHER): Payer: 59 | Admitting: Internal Medicine

## 2017-05-10 ENCOUNTER — Encounter: Payer: Self-pay | Admitting: Internal Medicine

## 2017-05-10 VITALS — BP 153/87 | HR 74 | Temp 98.0°F | Ht 74.0 in | Wt 211.9 lb

## 2017-05-10 DIAGNOSIS — E119 Type 2 diabetes mellitus without complications: Secondary | ICD-10-CM

## 2017-05-10 DIAGNOSIS — N529 Male erectile dysfunction, unspecified: Secondary | ICD-10-CM

## 2017-05-10 DIAGNOSIS — Z794 Long term (current) use of insulin: Secondary | ICD-10-CM | POA: Diagnosis not present

## 2017-05-10 DIAGNOSIS — I10 Essential (primary) hypertension: Secondary | ICD-10-CM | POA: Diagnosis not present

## 2017-05-10 HISTORY — DX: Male erectile dysfunction, unspecified: N52.9

## 2017-05-10 LAB — POCT GLYCOSYLATED HEMOGLOBIN (HGB A1C): Hemoglobin A1C: 12.2

## 2017-05-10 LAB — GLUCOSE, CAPILLARY: Glucose-Capillary: 349 mg/dL — ABNORMAL HIGH (ref 65–99)

## 2017-05-10 MED ORDER — ATORVASTATIN CALCIUM 40 MG PO TABS: 40.0000 mg | ORAL_TABLET | Freq: Every day | ORAL | 1 refills | Status: DC

## 2017-05-10 MED ORDER — GLIPIZIDE 5 MG PO TABS: 2.5000 mg | ORAL_TABLET | Freq: Every day | ORAL | 0 refills | Status: DC

## 2017-05-10 MED ORDER — SILDENAFIL CITRATE 20 MG PO TABS: ORAL_TABLET | ORAL | 0 refills | Status: DC

## 2017-05-10 MED ORDER — BENAZEPRIL HCL 20 MG PO TABS: 20.0000 mg | ORAL_TABLET | Freq: Every day | ORAL | 1 refills | Status: DC

## 2017-05-10 MED ORDER — METFORMIN HCL ER 500 MG PO TB24: 500.0000 mg | ORAL_TABLET | Freq: Every day | ORAL | 3 refills | Status: DC

## 2017-05-10 NOTE — Assessment & Plan Note (Addendum)
BP Readings from Last 3 Encounters:  05/10/17 (!) 153/87  09/16/16 (!) 151/92  07/30/16 (!) 183/113   Patient was hypertensive today to 153/87, as he did not take the benazepril for a week, and is not taking amlodipine due to concern for erectile dysfunction.   Plan -Asked him to restart benazepril, am not increasing the dosage as I am not sure what his blood pressure is on the 20 mg of benazepril -BMET today -close follow up

## 2017-05-10 NOTE — Assessment & Plan Note (Signed)
Pt said that since he took amlodipine, he was experiencing erectile dysfunction. Able to achieve nocturnal erection. After amlodipine was stopped by himself, he was still experiencing intermittent ED. Likely etiology is due to diabetes/ vasogenic.   Plan -prescribed revatio

## 2017-05-10 NOTE — Patient Instructions (Signed)
Thank you for your visit today Please take the metformin daily, and the glipizide daily. Please continue your long acting lantus insulin  Please take the benazepril daily - I will call you about your lab results  Please take the revatio/sildenafil as needed  Please follow up in 2-4 weeks

## 2017-05-10 NOTE — Assessment & Plan Note (Signed)
>>  ASSESSMENT AND PLAN FOR DIABETES MELLITUS TYPE 2, INSULIN  DEPENDENT (HCC) WRITTEN ON 05/10/2017  6:30 PM BY Burnell Carry, MD  Lab Results  Component Value Date   HGBA1C 12.2 05/10/2017   HGBA1C 9.3 09/16/2016   HGBA1C 10.0 05/30/2016    Pt's A1c today is 12.2, and he was last seen here in April. He says he is not taking the metformin -glyburide . He is also not taking the mealtime novolog . He only sometimes checks the CBGs and did not bring the glucometer. He is taking the Lantus  24 units daily. He says the main barrier is that he does not like sticking himself 3 times a day so not taking the novolog . Also was not able to tolerate regular metformin  due to gi upset.  Explained that his a1c has worsened and needs better control  Plan -prescribed metformin  extended release 500 mg daily -prescribed glipizide  2.5 mg daily -continue lantus  24 units daily -follow up closely- advised to bring glucometer at next visit  -given samples of test strips

## 2017-05-10 NOTE — Assessment & Plan Note (Signed)
Lab Results  Component Value Date   HGBA1C 12.2 05/10/2017   HGBA1C 9.3 09/16/2016   HGBA1C 10.0 05/30/2016    Pt's A1c today is 12.2, and he was last seen here in April. He says he is not taking the metformin-glyburide. He is also not taking the mealtime novolog. He only sometimes checks the CBGs and did not bring the glucometer. He is taking the Lantus 24 units daily. He says the main barrier is that he does not like sticking himself 3 times a day so not taking the novolog. Also was not able to tolerate regular metformin due to gi upset.  Explained that his a1c has worsened and needs better control  Plan -prescribed metformin extended release 500 mg daily -prescribed glipizide 2.5 mg daily -continue lantus 24 units daily -follow up closely- advised to bring glucometer at next visit  -given samples of test strips

## 2017-05-10 NOTE — Progress Notes (Signed)
   CC: HTN, DM, ED HPI: [name] is a 41 y.o. man with PMH noted below here for HTN, DM, ED  Please see Problem List/A&P for the status of the patient's chronic medical problems   Past Medical History:  Diagnosis Date  . Diabetes mellitus 2002  . Hypertension     Review of Systems: Constitutional: Negative for fever, chills HEENT: No headaches, vision problems, cough, hearing problems  Respiratory: Negative for cough, shortness of breath and wheezing.  Cardiovascular: Negative for chest pain, orthopnea, or PND   Gastrointestinal: Negative for heartburn, nausea, vomiting, abdominal pain, diarrhea  Physical Exam: Vitals:   05/10/17 1546  BP: (!) 153/87  Pulse: 74  Temp: 98 F (36.7 C)  TempSrc: Oral  SpO2: 100%  Weight: 211 lb 14.4 oz (96.1 kg)  Height: 6\' 2"  (1.88 m)    General: A&O, in NAD Neck: supple, midline trachea CV: RRR, normal s1, s2, no m/r/g Resp: equal and symmetric breath sounds, no wheezing or crackles  Abdomen: soft, nontender, nondistended, +BS Extremities: no edema   Assessment & Plan:   See encounters tab for problem based medical decision making. Patient discussed with Dr. Cyndie ChimeGranfortuna

## 2017-05-11 LAB — BMP8+ANION GAP
Anion Gap: 14 mmol/L (ref 10.0–18.0)
BUN / CREAT RATIO: 12 (ref 9–20)
BUN: 10 mg/dL (ref 6–24)
CHLORIDE: 98 mmol/L (ref 96–106)
CO2: 26 mmol/L (ref 20–29)
Calcium: 9.1 mg/dL (ref 8.7–10.2)
Creatinine, Ser: 0.82 mg/dL (ref 0.76–1.27)
GFR calc Af Amer: 126 mL/min/{1.73_m2} (ref >59)
GFR calc non Af Amer: 109 mL/min/{1.73_m2} (ref >59)
GLUCOSE: 328 mg/dL — AB (ref 65–99)
POTASSIUM: 4.5 mmol/L (ref 3.5–5.2)
SODIUM: 138 mmol/L (ref 134–144)

## 2017-05-11 NOTE — Progress Notes (Signed)
Medicine attending: Medical history, presenting problems, physical findings, and medications, reviewed with resident physician Dr Parth Saraiya on the day of the patient visit and I concur with his evaluation and management plan. 

## 2017-06-29 ENCOUNTER — Other Ambulatory Visit: Payer: Self-pay | Admitting: *Deleted

## 2017-06-29 NOTE — Telephone Encounter (Signed)
Requesting 90 days supply. Thanks 

## 2017-06-30 MED ORDER — METFORMIN HCL ER 500 MG PO TB24: 500.0000 mg | ORAL_TABLET | Freq: Every day | ORAL | 0 refills | Status: DC

## 2017-06-30 NOTE — Telephone Encounter (Signed)
[facility] with me for management of his diabetes.  Giving 3 month supply of metformin  Thanks

## 2017-07-08 ENCOUNTER — Other Ambulatory Visit: Payer: Self-pay | Admitting: Internal Medicine

## 2017-07-12 ENCOUNTER — Encounter: Payer: Self-pay | Admitting: *Deleted

## 2017-07-17 ENCOUNTER — Encounter (HOSPITAL_BASED_OUTPATIENT_CLINIC_OR_DEPARTMENT_OTHER): Payer: Self-pay | Admitting: Emergency Medicine

## 2017-07-17 ENCOUNTER — Emergency Department (HOSPITAL_BASED_OUTPATIENT_CLINIC_OR_DEPARTMENT_OTHER)
Admission: EM | Admit: 2017-07-17 | Discharge: 2017-07-17 | Disposition: A | Discharge status: Home / Self Care | Payer: 59 | Attending: Emergency Medicine | Admitting: Emergency Medicine

## 2017-07-17 ENCOUNTER — Other Ambulatory Visit: Payer: Self-pay

## 2017-07-17 DIAGNOSIS — Z794 Long term (current) use of insulin: Secondary | ICD-10-CM | POA: Diagnosis not present

## 2017-07-17 DIAGNOSIS — Z87891 Personal history of nicotine dependence: Secondary | ICD-10-CM | POA: Insufficient documentation

## 2017-07-17 DIAGNOSIS — Z79899 Other long term (current) drug therapy: Secondary | ICD-10-CM | POA: Insufficient documentation

## 2017-07-17 DIAGNOSIS — E119 Type 2 diabetes mellitus without complications: Secondary | ICD-10-CM | POA: Diagnosis not present

## 2017-07-17 DIAGNOSIS — R1084 Generalized abdominal pain: Secondary | ICD-10-CM | POA: Diagnosis not present

## 2017-07-17 DIAGNOSIS — I10 Essential (primary) hypertension: Secondary | ICD-10-CM | POA: Insufficient documentation

## 2017-07-17 DIAGNOSIS — J111 Influenza due to unidentified influenza virus with other respiratory manifestations: Secondary | ICD-10-CM

## 2017-07-17 DIAGNOSIS — R69 Illness, unspecified: Secondary | ICD-10-CM

## 2017-07-17 DIAGNOSIS — R05 Cough: Secondary | ICD-10-CM | POA: Diagnosis present

## 2017-07-17 MED ORDER — ALBUTEROL SULFATE HFA 108 (90 BASE) MCG/ACT IN AERS
2.0000 | INHALATION_SPRAY | RESPIRATORY_TRACT | Status: DC
  Administered 2017-07-17: 2 via RESPIRATORY_TRACT
  Filled 2017-07-17: qty 6.7

## 2017-07-17 NOTE — Discharge Instructions (Signed)
Can take over-the-counter mouth spray to alleviate your sore throat.  Take Tylenol to help with body aches.  You can also consider ibuprofen if Tylenol does not help.  I would encourage you to get your flu shot especially given your diabetes.

## 2017-07-17 NOTE — ED Notes (Signed)
ED Provider at bedside. 

## 2017-07-17 NOTE — ED Triage Notes (Signed)
Cough and body aches since yesterday, sore throat, had not taken temp. Took Tylenol last night

## 2017-07-17 NOTE — ED Provider Notes (Signed)
[facility] EMERGENCY DEPARTMENT Provider Note   CSN: [id number] Arrival date & time: 07/17/17  4098     History   Chief Complaint Chief Complaint  Patient presents with  . Cough    HPI Carlos [name] is a 42 y.o. male.  Patient is a 42 year old male sitting with 1 day of productive cough and generalized abdominal pain.  PMH significant for uncontrolled insulin-dependent diabetes, hypertension, hyperlipidemia.  Patient endorsing rapid onset nonproductive cough and generalized abdominal pain as of yesterday.  He noticed symptoms after waking up which became more progressive throughout the day.  Patient was able to maintain a regular diet throughout the day despite having the abdominal pain.  He took Tylenol during the evening without improvement of symptoms.  He denies sick contacts.  She also endorses some aching in his shoulders bilaterally since having the cough.  He has not received the flu shot this year.  Patient works at a [employer], [employer].  He currently smokes one black and mild daily and endorses less than 2 drinks per week and no recent drinking since Friday.  Patient versus history of marijuana use otherwise denies history of illicit drug use.  His insulin regimen was recently increased a few weeks ago by his PCP.  He has not taking his medications as of this morning.      Past Medical History:  Diagnosis Date  . Diabetes mellitus 2002  . Hypertension     Patient Active Problem List   Diagnosis Date Noted  . Erectile dysfunction 05/10/2017  . 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 Diabetes mellitus type 2, insulin dependent (HCC) 01/02/2016  . Hypertension, essential 01/02/2016  . Does not have health insurance 01/02/2016  . Tobacco abuse 01/02/2016  . Onychomycosis due to dermatophyte 10/24/2012  . Pain in joint, ankle and foot 10/24/2012    Past Surgical History:  Procedure Laterality Date  . FEMUR FRACTURE SURGERY     right  . INCISION AND  DRAINAGE PERIRECTAL ABSCESS N/A 01/02/2016   Procedure: IRRIGATION AND DEBRIDEMENT PERIRECTAL ABSCESS;  Surgeon: Axel Filler, MD;  Location: [facility] OR;  Service: General;  Laterality: N/A;       Home Medications    Prior to Admission medications   Medication Sig Start Date End Date Taking? Authorizing Provider  atorvastatin (LIPITOR) 40 MG tablet Take 1 tablet (40 mg total) by mouth daily. 05/10/17  Yes Deneise Lever, MD  benazepril (LOTENSIN) 20 MG tablet Take 1 tablet (20 mg total) by mouth daily. 05/10/17  Yes Deneise Lever, MD  Cinnamon 500 MG capsule Take 500 mg by mouth daily.   Yes [provider]  glipiZIDE (GLUCOTROL) 5 MG tablet TAKE 0.5 TABLETS (2.5 MG TOTAL) BY MOUTH DAILY BEFORE BREAKFAST. 07/10/17  Yes Inez Catalina, MD  Insulin Detemir (LEVEMIR FLEXPEN) 100 UNIT/ML Pen Inject 24 Units into the skin daily. In morning 02/03/17  Yes Deneise Lever, MD  metFORMIN (GLUCOPHAGE-XR) 500 MG 24 hr tablet Take 1 tablet (500 mg total) by mouth daily with breakfast. 06/30/17  Yes Deneise Lever, MD  glucose blood test strip The patient is insulin requiring, ICD 10 code E11.9. The patient tests 4 times per day. 09/16/16   Rivet, Iris Pert, MD  Insulin Pen Needle (PEN NEEDLES) 31G X 5 MM MISC 1 each by Does not apply route daily before breakfast. diag code E11.9 insulin dependent Patient not taking: Reported on 05/10/2017 02/02/17   Deneise Lever, MD  sildenafil (REVATIO) 20 MG tablet Take 1-2 tablets  as needed 30 minutes prior to sexual activity 05/10/17   Deneise Lever, MD    Family History No family history on file.  Social History Social History   Tobacco Use  . Smoking status: Former Smoker    Packs/day: 0.10    Years: 10.00    Pack years: 1.00    Types: Cigarettes    Last attempt to quit: 12/22/2015    Years since quitting: 1.5  . Smokeless tobacco: Never Used  . Tobacco comment: Recently quit  Substance Use Topics  . Alcohol use: No  . Drug use: No      Allergies   Penicillins   Review of Systems Review of Systems  Constitutional: Positive for chills, fatigue and fever. Negative for diaphoresis.  HENT: Positive for sore throat. Negative for congestion, rhinorrhea and sneezing.   Eyes: Negative for discharge and itching.  Respiratory: Positive for cough. Negative for shortness of breath and wheezing.   Cardiovascular: Negative for chest pain, palpitations and leg swelling.  Gastrointestinal: Positive for abdominal pain. Negative for blood in stool, constipation, diarrhea, nausea and vomiting.  Genitourinary: Negative for dysuria and frequency.  Musculoskeletal: Positive for arthralgias. Negative for back pain and neck pain.  Skin: Negative for pallor and rash.  Neurological: Positive for headaches. Negative for dizziness.  Psychiatric/Behavioral: Negative for confusion. The patient is not nervous/anxious.      Physical Exam Updated Vital Signs BP (!) 167/100 (BP Location: Right Arm)   Pulse (!) 107   Temp 99.3 F (37.4 C) (Oral)   Resp 20   Ht 6\' 2"  (1.88 m)   Wt 95.3 kg (210 lb)   SpO2 99%   BMI 26.96 kg/m   Physical Exam  Constitutional: He is oriented to person, place, and time. He appears well-developed and well-nourished. No distress.  HENT:  Head: Normocephalic and atraumatic.  Right Ear: External ear normal.  Left Ear: External ear normal.  Nose: Nose normal.  Mouth/Throat: Oropharynx is clear and moist.  Eyes: Conjunctivae and EOM are normal. Pupils are equal, round, and reactive to light.  Neck: Normal range of motion. Neck supple.  Cardiovascular: Normal rate, regular rhythm, normal heart sounds and intact distal pulses. Exam reveals no gallop and no friction rub.  No murmur heard. Pulmonary/Chest: Effort normal and breath sounds normal. No stridor. No respiratory distress. He has no wheezes. He has no rales.  Abdominal: Soft. Bowel sounds are normal. He exhibits no distension and no mass. There is no  tenderness. There is no rebound and no guarding.  Musculoskeletal: Normal range of motion.  Neurological: He is alert and oriented to person, place, and time.  Skin: Skin is warm and dry. Capillary refill takes less than 2 seconds. No rash noted. He is not diaphoretic.  Psychiatric: He has a normal mood and affect. His behavior is normal.     ED Treatments / Results  Labs (all labs ordered are listed, but only abnormal results are displayed) Labs Reviewed - No data to display  EKG  EKG Interpretation None       Radiology No results found.  Procedures Procedures (including critical care time)  Medications Ordered in ED Medications  albuterol (PROVENTIL HFA;VENTOLIN HFA) 108 (90 Base) MCG/ACT inhaler 2 puff (2 puffs Inhalation Given 07/17/17 0817)     Initial Impression / Assessment and Plan / ED Course  I have reviewed the triage vital signs and the nursing notes.  Pertinent labs & imaging results that were available during my care of the  patient were reviewed by me and considered in my medical decision making (see chart for details).    Patient is a 42 year old male sitting with 1 day of productive cough and generalized abdominal pain.  PMH significant for uncontrolled insulin-dependent diabetes, hypertension, hyperlipidemia.  Patient presenting with nonproductive cough upon presentation.  Vital signs afebrile with hypertension 160s/100, tachycardia 107 with normal work of breathing and 99% on room air.  Lungs appear clear on exam bilaterally.  Patient denies chest pain.  Abdominal exam benign.  Patient states abdominal pain resolved as of this morning.  He denies any nausea, vomiting, or diarrhea.  Symptoms likely related to influenza-like illness.  We will not pursue abdominal workup given resolution of abdominal pain.  Patient well-appearing on exam.  Decision made to discharge patient with instructions to follow-up with PCP and receive flu shot especially given his history  of poorly controlled diabetes.  Patient adamant about receiving work note in order to receive FMLA.  Patient given today work excuse for cold symptoms.  Final Clinical Impressions(s) / ED Diagnoses   Final diagnoses:  Influenza-like illness    ED Discharge Orders    None       Wendee BeaversMcMullen, Kerin Cecchi J, DO 07/17/17 0830    Azalia Bilisampos, Kevin, MD 07/17/17 [phone]

## 2017-07-25 ENCOUNTER — Other Ambulatory Visit: Payer: Self-pay | Admitting: *Deleted

## 2017-07-25 NOTE — Telephone Encounter (Signed)
Patient is seeing an endocrinologist at [facility] who just increased the patient's lantus dose.  I feel uncomfortable also writing a prescription for lantus.  It should be one physician managing the patient's diabetes.

## 2017-08-24 ENCOUNTER — Emergency Department (HOSPITAL_BASED_OUTPATIENT_CLINIC_OR_DEPARTMENT_OTHER)
Admission: EM | Admit: 2017-08-24 | Discharge: 2017-08-24 | Disposition: A | Discharge status: Home / Self Care | Payer: 59 | Attending: Emergency Medicine | Admitting: Emergency Medicine

## 2017-08-24 ENCOUNTER — Encounter (HOSPITAL_BASED_OUTPATIENT_CLINIC_OR_DEPARTMENT_OTHER): Payer: Self-pay | Admitting: Emergency Medicine

## 2017-08-24 ENCOUNTER — Other Ambulatory Visit: Payer: Self-pay

## 2017-08-24 DIAGNOSIS — K5900 Constipation, unspecified: Secondary | ICD-10-CM | POA: Diagnosis not present

## 2017-08-24 DIAGNOSIS — Z79899 Other long term (current) drug therapy: Secondary | ICD-10-CM | POA: Insufficient documentation

## 2017-08-24 DIAGNOSIS — Z87891 Personal history of nicotine dependence: Secondary | ICD-10-CM | POA: Insufficient documentation

## 2017-08-24 DIAGNOSIS — R1084 Generalized abdominal pain: Secondary | ICD-10-CM | POA: Insufficient documentation

## 2017-08-24 DIAGNOSIS — R110 Nausea: Secondary | ICD-10-CM | POA: Diagnosis not present

## 2017-08-24 DIAGNOSIS — E119 Type 2 diabetes mellitus without complications: Secondary | ICD-10-CM | POA: Diagnosis not present

## 2017-08-24 DIAGNOSIS — Z794 Long term (current) use of insulin: Secondary | ICD-10-CM | POA: Diagnosis not present

## 2017-08-24 DIAGNOSIS — I10 Essential (primary) hypertension: Secondary | ICD-10-CM | POA: Diagnosis not present

## 2017-08-24 LAB — CBC WITH DIFFERENTIAL/PLATELET
Basophils Absolute: 0 10*3/uL (ref 0.0–0.1)
Basophils Relative: 0 %
EOS ABS: 0.1 10*3/uL (ref 0.0–0.7)
EOS PCT: 1 %
HCT: 38.4 % — ABNORMAL LOW (ref 39.0–52.0)
Hemoglobin: 13.5 g/dL (ref 13.0–17.0)
LYMPHS ABS: 1.7 10*3/uL (ref 0.7–4.0)
LYMPHS PCT: 21 %
MCH: 32.9 pg (ref 26.0–34.0)
MCHC: 35.2 g/dL (ref 30.0–36.0)
MCV: 93.7 fL (ref 78.0–100.0)
MONO ABS: 0.8 10*3/uL (ref 0.1–1.0)
Monocytes Relative: 10 %
Neutro Abs: 5.6 10*3/uL (ref 1.7–7.7)
Neutrophils Relative %: 68 %
PLATELETS: 279 10*3/uL (ref 150–400)
RBC: 4.1 MIL/uL — ABNORMAL LOW (ref 4.22–5.81)
RDW: 13.7 % (ref 11.5–15.5)
WBC: 8.2 10*3/uL (ref 4.0–10.5)

## 2017-08-24 LAB — URINALYSIS, MICROSCOPIC (REFLEX)

## 2017-08-24 LAB — URINALYSIS, ROUTINE W REFLEX MICROSCOPIC
BILIRUBIN URINE: NEGATIVE
HGB URINE DIPSTICK: NEGATIVE
KETONES UR: NEGATIVE mg/dL
Leukocytes, UA: NEGATIVE
Nitrite: NEGATIVE
Protein, ur: NEGATIVE mg/dL
Specific Gravity, Urine: 1.02 (ref 1.005–1.030)
pH: 7 (ref 5.0–8.0)

## 2017-08-24 LAB — COMPREHENSIVE METABOLIC PANEL
ALBUMIN: 4.4 g/dL (ref 3.5–5.0)
ALK PHOS: 66 U/L (ref 38–126)
ALT: 48 U/L (ref 17–63)
AST: 34 U/L (ref 15–41)
Anion gap: 6 (ref 5–15)
BILIRUBIN TOTAL: 0.5 mg/dL (ref 0.3–1.2)
BUN: 10 mg/dL (ref 6–20)
CALCIUM: 9.2 mg/dL (ref 8.9–10.3)
CO2: 26 mmol/L (ref 22–32)
CREATININE: 0.75 mg/dL (ref 0.61–1.24)
Chloride: 101 mmol/L (ref 101–111)
GFR calc Af Amer: >60 mL/min (ref >60)
GFR calc non Af Amer: >60 mL/min (ref >60)
GLUCOSE: 249 mg/dL — AB (ref 65–99)
Potassium: 3.5 mmol/L (ref 3.5–5.1)
Sodium: 133 mmol/L — ABNORMAL LOW (ref 135–145)
TOTAL PROTEIN: 7.4 g/dL (ref 6.5–8.1)

## 2017-08-24 LAB — CBG MONITORING, ED: Glucose-Capillary: 240 mg/dL — ABNORMAL HIGH (ref 65–99)

## 2017-08-24 LAB — LIPASE, BLOOD: Lipase: 42 U/L (ref 11–51)

## 2017-08-24 MED ORDER — ONDANSETRON 4 MG PO TBDP
ORAL_TABLET | ORAL | Status: AC
  Filled 2017-08-24: qty 1

## 2017-08-24 MED ORDER — ONDANSETRON HCL 8 MG PO TABS: 4.0000 mg | ORAL_TABLET | Freq: Once | ORAL | Status: DC

## 2017-08-24 MED ORDER — ONDANSETRON HCL 4 MG PO TABS: 4.0000 mg | ORAL_TABLET | Freq: Three times a day (TID) | ORAL | 0 refills | Status: DC | PRN

## 2017-08-24 MED ORDER — ONDANSETRON 4 MG PO TBDP: 4.0000 mg | ORAL_TABLET | Freq: Once | ORAL | Status: DC

## 2017-08-24 NOTE — ED Notes (Signed)
ED Provider at bedside. 

## 2017-08-24 NOTE — Discharge Instructions (Signed)
Your diagnostic laboratory testing today was overall reassuring.  We did not find evidence of DKA or other organ injury.  We did not find evidence of a bacterial infection.  Your exam  was not consistent with appendicitis, diverticulitis, bowel obstruction, or cholecystitis with your gallbladder.  Given your ability to stay hydrated and eat and drink here, we feel you are safe for discharge home with a prescription for nausea medicine.  Please follow-up with your primary doctor and rest.  If any symptoms change or worsen, please return to the nearest emergency department.

## 2017-08-24 NOTE — ED Provider Notes (Signed)
[facility] EMERGENCY DEPARTMENT Provider Note   CSN: [id number] Arrival date & time: 08/24/17  1819     History   Chief Complaint Chief Complaint  Patient presents with  . Abdominal Pain    HPI Carlos [name] is a 42 y.o. male.  The history is provided by the patient, the spouse and medical records. No language interpreter was used.  Abdominal Pain   This is a new problem. The current episode started yesterday. The problem occurs constantly. The problem has not changed since onset.The pain is associated with an unknown (possible smell at work) factor. The pain is located in the generalized abdominal region. The pain is at a severity of 8/10. The pain is moderate. Associated symptoms include nausea and constipation. Pertinent negatives include anorexia, fever, diarrhea, melena, vomiting, dysuria, frequency, headaches and myalgias. Nothing aggravates the symptoms. Nothing relieves the symptoms. His past medical history does not include gallstones, GERD, ulcerative colitis, Crohn's disease or irritable bowel syndrome.    Past Medical History:  Diagnosis Date  . Diabetes mellitus 2002  . Hypertension     Patient Active Problem List   Diagnosis Date Noted  . Erectile dysfunction 05/10/2017  . 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 Diabetes mellitus type 2, insulin dependent (HCC) 01/02/2016  . Hypertension, essential 01/02/2016  . Does not have health insurance 01/02/2016  . Tobacco abuse 01/02/2016  . Onychomycosis due to dermatophyte 10/24/2012  . Pain in joint, ankle and foot 10/24/2012    Past Surgical History:  Procedure Laterality Date  . FEMUR FRACTURE SURGERY     right  . INCISION AND DRAINAGE PERIRECTAL ABSCESS N/A 01/02/2016   Procedure: IRRIGATION AND DEBRIDEMENT PERIRECTAL ABSCESS;  Surgeon: Axel Filler, MD;  Location: [facility] OR;  Service: General;  Laterality: N/A;       Home Medications    Prior to Admission medications   Medication Sig Start Date  End Date Taking? Authorizing Provider  atorvastatin (LIPITOR) 40 MG tablet Take 1 tablet (40 mg total) by mouth daily. 05/10/17   Deneise Lever, MD  benazepril (LOTENSIN) 20 MG tablet Take 1 tablet (20 mg total) by mouth daily. 05/10/17   Deneise Lever, MD  Cinnamon 500 MG capsule Take 500 mg by mouth daily.    [provider]  glipiZIDE (GLUCOTROL) 5 MG tablet TAKE 0.5 TABLETS (2.5 MG TOTAL) BY MOUTH DAILY BEFORE BREAKFAST. 07/10/17   Debe Coder B, MD  glucose blood test strip The patient is insulin requiring, ICD 10 code E11.9. The patient tests 4 times per day. 09/16/16   Rivet, Iris Pert, MD  Insulin Detemir (LEVEMIR FLEXPEN) 100 UNIT/ML Pen Inject 24 Units into the skin daily. In morning 02/03/17   Deneise Lever, MD  Insulin Pen Needle (PEN NEEDLES) 31G X 5 MM MISC 1 each by Does not apply route daily before breakfast. diag code E11.9 insulin dependent Patient not taking: Reported on 05/10/2017 02/02/17   Deneise Lever, MD  metFORMIN (GLUCOPHAGE-XR) 500 MG 24 hr tablet Take 1 tablet (500 mg total) by mouth daily with breakfast. 06/30/17   Deneise Lever, MD    Family History No family history on file.  Social History Social History   Tobacco Use  . Smoking status: Former Smoker    Packs/day: 0.10    Years: 10.00    Pack years: 1.00    Types: Cigarettes    Last attempt to quit: 12/22/2015    Years since quitting: 1.6  . Smokeless tobacco: Never Used  . Tobacco comment: Recently  quit  Substance Use Topics  . Alcohol use: No  . Drug use: No     Allergies   Penicillins   Review of Systems Review of Systems  Constitutional: Negative for chills, diaphoresis, fatigue and fever.  HENT: Negative for congestion and rhinorrhea.   Eyes: Negative for visual disturbance.  Respiratory: Negative for cough, chest tightness, shortness of breath, wheezing and stridor.   Cardiovascular: Negative for chest pain and palpitations.  Gastrointestinal: Positive for abdominal pain,  constipation and nausea. Negative for anorexia, diarrhea, melena and vomiting.  Genitourinary: Negative for dysuria, flank pain and frequency.  Musculoskeletal: Negative for back pain, myalgias, neck pain and neck stiffness.  Skin: Negative for rash and wound.  Neurological: Negative for light-headedness, numbness and headaches.  Psychiatric/Behavioral: Negative for agitation and confusion.  All other systems reviewed and are negative.    Physical Exam Updated Vital Signs BP (!) 184/106 (BP Location: Right Arm)   Pulse 88   Temp 98.4 F (36.9 C) (Oral)   Resp 20   Ht 6\' 2"  (1.88 m)   Wt 98 kg (216 lb 0.8 oz)   SpO2 100%   BMI 27.74 kg/m   Physical Exam  Constitutional: He is oriented to person, place, and time. He appears well-developed and well-nourished.  Non-toxic appearance. He does not appear ill. No distress.  HENT:  Head: Normocephalic.  Mouth/Throat: Oropharynx is clear and moist. No oropharyngeal exudate.  Eyes: Conjunctivae and EOM are normal. Pupils are equal, round, and reactive to light.  Neck: Normal range of motion.  Cardiovascular: Normal rate and intact distal pulses.  No murmur heard. Pulmonary/Chest: Effort normal and breath sounds normal. No respiratory distress. He has no wheezes. He exhibits no tenderness.  Abdominal: Soft. Bowel sounds are normal. He exhibits no distension and no mass. There is no tenderness. There is no guarding. Hernia confirmed negative in the right inguinal area and confirmed negative in the left inguinal area.  Genitourinary: Testes normal and penis normal. Right testis shows no tenderness. Left testis shows no tenderness.  Musculoskeletal: Normal range of motion. He exhibits no edema or tenderness.  Lymphadenopathy: No inguinal adenopathy noted on the right or left side.  Neurological: He is alert and oriented to person, place, and time. No sensory deficit. He exhibits normal muscle tone.  Skin: Capillary refill takes less than 2  seconds. No rash noted. He is not diaphoretic. No erythema. No pallor.  Psychiatric: He has a normal mood and affect.  Nursing note and vitals reviewed.    ED Treatments / Results  Labs (all labs ordered are listed, but only abnormal results are displayed) Labs Reviewed  COMPREHENSIVE METABOLIC PANEL - Abnormal; Notable for the following components:      Result Value   Sodium 133 (*)    Glucose, Bld 249 (*)    All other components within normal limits  CBC WITH DIFFERENTIAL/PLATELET - Abnormal; Notable for the following components:   RBC 4.10 (*)    HCT 38.4 (*)    All other components within normal limits  URINALYSIS, ROUTINE W REFLEX MICROSCOPIC - Abnormal; Notable for the following components:   Glucose, UA >=500 (*)    All other components within normal limits  URINALYSIS, MICROSCOPIC (REFLEX) - Abnormal; Notable for the following components:   Bacteria, UA MANY (*)    Squamous Epithelial / LPF 0-5 (*)    All other components within normal limits  CBG MONITORING, ED - Abnormal; Notable for the following components:   Glucose-Capillary 240 (*)  All other components within normal limits  LIPASE, BLOOD    EKG  EKG Interpretation None       Radiology No results found.  Procedures Procedures (including critical care time)  Medications Ordered in ED Medications - No data to display   Initial Impression / Assessment and Plan / ED Course  I have reviewed the triage vital signs and the nursing notes.  Pertinent labs & imaging results that were available during my care of the patient were reviewed by me and considered in my medical decision making (see chart for details).     Carlos [name] is a 42 y.o. male with a past medical history significant for diabetes and hypertension who presents with nausea and abdominal pain.  Patient reports that his symptoms began yesterday and have been intermittent for the last 2 days.  He says that his symptoms have been worsened  at work and were resolved this morning before going to work.  He says that it is a diffuse aching with associated nausea but no vomiting.  He denies any diarrhea but does report some constipation.  He denies any urinary symptoms and is still passing gas.  He denies any fevers, chills, chest pain or shortness of breath.  He has had no trauma.  He describes it as moderate and aching.  On exam, abdomen is completely nontender.  GU exam unremarkable with no hernias or scrotal tenderness.  No rashes seen.  Lungs clear and CVA area is nontender.  Exam completely benign.  Patient has screening laboratory testing to look for severe abnormalities.  No significant abnormalities were discovered and no evidence of infection.  Specifically, patient had normal liver function testing, normal lipase, and no UTI.  Given the completely benign exam with no tenderness, do not feel patient has obstruction, appendicitis, cholecystitis, or diverticulitis.  Patient was given nausea medicine and able to tolerate fluids.  Based on reassuring exam do not feel patient has an emergent cause of his symptoms that requires admission or further inpatient management.  As patient has had associated worsening of symptoms going to work, discussion was held about this.  Patient says that there was a chemical smell that he smelled at work and may been related to his symptoms.  Patient denies other people having symptoms and I do not feel patient has an acute toxidrome or toxic exposure at this time.  Patient will speak with someone at his job to further investigate this.  Patient will follow up with PCP for further management of his symptoms.  Patient given prescription for nausea medicine and instructed to stay hydrated.  Patient had no other questions or concerns and understood return precautions.  Patient discharged in good condition.   Final Clinical Impressions(s) / ED Diagnoses   Final diagnoses:  Generalized abdominal pain  Nausea     ED Discharge Orders        Ordered    ondansetron (ZOFRAN) 4 MG tablet  Every 8 hours PRN     08/24/17 2253      Clinical Impression: 1. Generalized abdominal pain   2. Nausea     Disposition: Discharge  Condition: Good  I have discussed the results, Dx and Tx plan with the pt(& family if present). He/she/they expressed understanding and agree(s) with the plan. Discharge instructions discussed at great length. Strict return precautions discussed and pt &/or family have verbalized understanding of the instructions. No further questions at time of discharge.    Discharge Medication List as of 08/24/2017  10:54 PM    START taking these medications   Details  ondansetron (ZOFRAN) 4 MG tablet Take 1 tablet (4 mg total) by mouth every 8 (eight) hours as needed., Starting Thu 08/24/2017, Print        Follow Up: [facility] [address] [place] [address] [phone] Schedule an appointment as soon as possible for a visit    [facility] [place] [facility] [address] [id number] [address] [place] [address] [phone]       Alanny Rivers, Canary Brim, MD 08/25/17 [phone]

## 2017-08-24 NOTE — ED Triage Notes (Signed)
Generalized abdominal pain since yesterday.  Pt states last CBG was yesterday am.  Pt takes levemir.  Some nausea, no fever, last bm yesterday and was normal.

## 2017-08-24 NOTE — ED Notes (Signed)
Pt drinking water 

## 2017-08-28 ENCOUNTER — Other Ambulatory Visit: Payer: Self-pay | Admitting: *Deleted

## 2017-08-28 MED ORDER — METFORMIN HCL ER 500 MG PO TB24: 500.0000 mg | ORAL_TABLET | Freq: Every day | ORAL | 0 refills | Status: DC

## 2017-08-28 NOTE — Telephone Encounter (Signed)
Will not increase dose at this time, pt seen in ED recently for abdominal pain although no mention of diarrhea.  Can assess at next appointment

## 2017-09-04 ENCOUNTER — Telehealth: Payer: Self-pay | Admitting: *Deleted

## 2017-09-04 MED ORDER — SILDENAFIL CITRATE 50 MG PO TABS: 50.0000 mg | ORAL_TABLET | Freq: Every day | ORAL | 1 refills | Status: DC | PRN

## 2017-09-04 NOTE — Telephone Encounter (Addendum)
Received faxed request from [facility] for refill on Sildenafil. Last rx 05/10/2017. No longer on patient's current med list. Will route to new PCP for review. Kinnie FeilL. Saniya Tranchina, RN, BSN

## 2017-09-04 NOTE — Telephone Encounter (Signed)
Refilled medication as there are no contraindications and it appears pt tolerated well in the past

## 2017-09-19 ENCOUNTER — Other Ambulatory Visit: Payer: Self-pay | Admitting: *Deleted

## 2017-09-19 DIAGNOSIS — I10 Essential (primary) hypertension: Secondary | ICD-10-CM

## 2017-09-19 MED ORDER — BENAZEPRIL HCL 20 MG PO TABS: 20.0000 mg | ORAL_TABLET | Freq: Every day | ORAL | 2 refills | Status: DC

## 2017-10-19 ENCOUNTER — Encounter: Payer: 59 | Admitting: Internal Medicine

## 2017-10-30 ENCOUNTER — Encounter: Payer: 59 | Admitting: Internal Medicine

## 2017-11-03 DIAGNOSIS — L84 Corns and callosities: Secondary | ICD-10-CM

## 2017-11-03 DIAGNOSIS — B351 Tinea unguium: Secondary | ICD-10-CM

## 2017-11-03 DIAGNOSIS — M79675 Pain in left toe(s): Secondary | ICD-10-CM | POA: Insufficient documentation

## 2017-11-03 HISTORY — DX: Tinea unguium: B35.1

## 2017-11-03 HISTORY — DX: Corns and callosities: L84

## 2017-11-20 ENCOUNTER — Other Ambulatory Visit: Payer: Self-pay | Admitting: *Deleted

## 2017-11-20 DIAGNOSIS — Z794 Long term (current) use of insulin: Principal | ICD-10-CM

## 2017-11-20 DIAGNOSIS — E119 Type 2 diabetes mellitus without complications: Secondary | ICD-10-CM

## 2017-11-20 MED ORDER — ATORVASTATIN CALCIUM 40 MG PO TABS: 40.0000 mg | ORAL_TABLET | Freq: Every day | ORAL | 0 refills | Status: DC

## 2017-11-20 NOTE — Telephone Encounter (Signed)
Will give 90 day supply

## 2018-01-02 ENCOUNTER — Other Ambulatory Visit: Payer: Self-pay

## 2018-01-02 ENCOUNTER — Other Ambulatory Visit: Payer: Self-pay | Admitting: Internal Medicine

## 2018-01-02 ENCOUNTER — Emergency Department (HOSPITAL_BASED_OUTPATIENT_CLINIC_OR_DEPARTMENT_OTHER)
Admission: EM | Admit: 2018-01-02 | Discharge: 2018-01-02 | Disposition: A | Discharge status: Home / Self Care | Payer: 59 | Attending: Emergency Medicine | Admitting: Emergency Medicine

## 2018-01-02 ENCOUNTER — Encounter (HOSPITAL_BASED_OUTPATIENT_CLINIC_OR_DEPARTMENT_OTHER): Payer: Self-pay | Admitting: *Deleted

## 2018-01-02 DIAGNOSIS — K625 Hemorrhage of anus and rectum: Secondary | ICD-10-CM | POA: Diagnosis not present

## 2018-01-02 DIAGNOSIS — I10 Essential (primary) hypertension: Secondary | ICD-10-CM | POA: Insufficient documentation

## 2018-01-02 DIAGNOSIS — F1210 Cannabis abuse, uncomplicated: Secondary | ICD-10-CM | POA: Insufficient documentation

## 2018-01-02 DIAGNOSIS — E119 Type 2 diabetes mellitus without complications: Secondary | ICD-10-CM | POA: Insufficient documentation

## 2018-01-02 DIAGNOSIS — F17210 Nicotine dependence, cigarettes, uncomplicated: Secondary | ICD-10-CM | POA: Diagnosis not present

## 2018-01-02 DIAGNOSIS — Z794 Long term (current) use of insulin: Principal | ICD-10-CM

## 2018-01-02 LAB — COMPREHENSIVE METABOLIC PANEL
ALBUMIN: 4 g/dL (ref 3.5–5.0)
ALK PHOS: 61 U/L (ref 38–126)
ALT: 20 U/L (ref 0–44)
AST: 27 U/L (ref 15–41)
Anion gap: 8 (ref 5–15)
BILIRUBIN TOTAL: 0.6 mg/dL (ref 0.3–1.2)
BUN: 10 mg/dL (ref 6–20)
CALCIUM: 8.9 mg/dL (ref 8.9–10.3)
CO2: 28 mmol/L (ref 22–32)
CREATININE: 0.73 mg/dL (ref 0.61–1.24)
Chloride: 102 mmol/L (ref 98–111)
GFR calc Af Amer: >60 mL/min (ref >60)
GFR calc non Af Amer: >60 mL/min (ref >60)
GLUCOSE: 145 mg/dL — AB (ref 70–99)
Potassium: 3.6 mmol/L (ref 3.5–5.1)
Sodium: 138 mmol/L (ref 135–145)
TOTAL PROTEIN: 7.2 g/dL (ref 6.5–8.1)

## 2018-01-02 LAB — CBC WITH DIFFERENTIAL/PLATELET
BASOS ABS: 0 10*3/uL (ref 0.0–0.1)
BASOS PCT: 0 %
Eosinophils Absolute: 0.1 10*3/uL (ref 0.0–0.7)
Eosinophils Relative: 1 %
HEMATOCRIT: 35.9 % — AB (ref 39.0–52.0)
HEMOGLOBIN: 12.8 g/dL — AB (ref 13.0–17.0)
LYMPHS PCT: 34 %
Lymphs Abs: 2.2 10*3/uL (ref 0.7–4.0)
MCH: 33.2 pg (ref 26.0–34.0)
MCHC: 35.7 g/dL (ref 30.0–36.0)
MCV: 93.2 fL (ref 78.0–100.0)
MONOS PCT: 13 %
Monocytes Absolute: 0.9 10*3/uL (ref 0.1–1.0)
NEUTROS ABS: 3.3 10*3/uL (ref 1.7–7.7)
NEUTROS PCT: 52 %
Platelets: 252 10*3/uL (ref 150–400)
RBC: 3.85 MIL/uL — ABNORMAL LOW (ref 4.22–5.81)
RDW: 12.8 % (ref 11.5–15.5)
WBC: 6.4 10*3/uL (ref 4.0–10.5)

## 2018-01-02 LAB — OCCULT BLOOD X 1 CARD TO LAB, STOOL: Fecal Occult Bld: NEGATIVE

## 2018-01-02 NOTE — Discharge Instructions (Signed)
You were seen in the ED today with concern for rectal bleeding. Your blood counts were normal and we found no evidence of blood in the stool at this time. If you continue to see blood in the bowel movements you will need to see your PCP and possibly the gastroenterologist. I have listed the contact information of a local gastroenterologist.

## 2018-01-02 NOTE — ED Notes (Signed)
Family at bedside. 

## 2018-01-02 NOTE — ED Provider Notes (Signed)
Emergency Department Provider Note   I have reviewed the triage vital signs and the nursing notes.   HISTORY  Chief Complaint Rectal Bleeding   HPI Carlos [name] is a 42 y.o. male with PMH of DM and HTN presents to the emergency department for evaluation of rectal bleeding.  Starting yesterday the patient noticed bright red blood on the tissue paper after wiping following a bowel movement.  He states he seen some blood in the toilet as well.  The stool appears normal.  He denies any black, tarry stools denies rectal pain or pain with defecation.  No fevers or chills.  Patient is not anticoagulated.  He does have a history of hemorrhoids but does not feel as if he has those at this time.  No radiation of symptoms or modifying factors.   Past Medical History:  Diagnosis Date  . Diabetes mellitus 2002  . Hypertension     Patient Active Problem List   Diagnosis Date Noted  . Erectile dysfunction 05/10/2017  . 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 Diabetes mellitus type 2, insulin dependent (HCC) 01/02/2016  . Hypertension, essential 01/02/2016  . Does not have health insurance 01/02/2016  . Tobacco abuse 01/02/2016  . Onychomycosis due to dermatophyte 10/24/2012  . Pain in joint, ankle and foot 10/24/2012    Past Surgical History:  Procedure Laterality Date  . FEMUR FRACTURE SURGERY     right  . INCISION AND DRAINAGE PERIRECTAL ABSCESS N/A 01/02/2016   Procedure: IRRIGATION AND DEBRIDEMENT PERIRECTAL ABSCESS;  Surgeon: Axel Filler, MD;  Location: [facility] OR;  Service: General;  Laterality: N/A;    Allergies Penicillins  History reviewed. No pertinent family history.  Social History Social History   Tobacco Use  . Smoking status: Current Some Day Smoker    Packs/day: 0.10    Years: 10.00    Pack years: 1.00    Types: Cigarettes    Last attempt to quit: 12/22/2015    Years since quitting: 2.0  . Smokeless tobacco: Never Used  . Tobacco comment: Recently quit    Substance Use Topics  . Alcohol use: No  . Drug use: Yes    Types: Marijuana    Review of Systems  Constitutional: No fever/chills Eyes: No visual changes. ENT: No sore throat. Cardiovascular: Denies chest pain. Respiratory: Denies shortness of breath. Gastrointestinal: No abdominal pain.  No nausea, no vomiting.  No diarrhea.  No constipation. Positive blood per rectum.  Genitourinary: Negative for dysuria. Musculoskeletal: Negative for back pain. Skin: Negative for rash. Neurological: Negative for headaches, focal weakness or numbness.  10-point ROS otherwise negative.  ____________________________________________   PHYSICAL EXAM:  VITAL SIGNS: ED Triage Vitals  Enc Vitals Group     BP 01/02/18 1728 (!) 172/99     Pulse Rate 01/02/18 1728 63     Resp 01/02/18 1728 16     Temp 01/02/18 1728 97.9 F (36.6 C)     Temp src --      SpO2 01/02/18 1728 100 %     Weight 01/02/18 1729 215 lb (97.5 kg)     Height 01/02/18 1729 6\' 2"  (1.88 m)   Constitutional: Alert and oriented. Well appearing and in no acute distress. Eyes: Conjunctivae are normal.  Head: Atraumatic. Nose: No congestion/rhinnorhea. Mouth/Throat: Mucous membranes are moist.  Oropharynx non-erythematous. Neck: No stridor.   Cardiovascular: Normal rate, regular rhythm. Good peripheral circulation. Grossly normal heart sounds.   Respiratory: Normal respiratory effort.  No retractions. Lungs CTAB. Gastrointestinal: Soft and  nontender. No distention. Rectal exam with no gross blood or melena. No external hemorrhoids or fissures.  Musculoskeletal: No lower extremity tenderness nor edema. No gross deformities of extremities. Neurologic:  Normal speech and language. No gross focal neurologic deficits are appreciated.  Skin:  Skin is warm, dry and intact. No rash noted.  ____________________________________________   LABS (all labs ordered are listed, but only abnormal results are displayed)  Labs Reviewed   CBC WITH DIFFERENTIAL/PLATELET - Abnormal; Notable for the following components:      Result Value   RBC 3.85 (*)    Hemoglobin 12.8 (*)    HCT 35.9 (*)    All other components within normal limits  COMPREHENSIVE METABOLIC PANEL - Abnormal; Notable for the following components:   Glucose, Bld 145 (*)    All other components within normal limits  OCCULT BLOOD X 1 CARD TO LAB, STOOL   ____________________________________________  RADIOLOGY  None ____________________________________________   PROCEDURES  Procedure(s) performed:   Procedures  None ____________________________________________   INITIAL IMPRESSION / ASSESSMENT AND PLAN / ED COURSE  Pertinent labs & imaging results that were available during my care of the patient were reviewed by me and considered in my medical decision making (see chart for details).   Patient presents to the emergency department for evaluation of rectal bleeding.  Patient with no pain.  No gross blood or melena on digital rectal exam.  Sending for Hemoccult testing, labs, reassess.  No visible hemorrhoids or fissures.  Labs unremarkable. Hemoccult negative. Plan for observation at home and PCP follow up. Provided contact information for GI if symptoms return/continue me may benefit from upper/lower endoscopy.   At this time, I do not feel there is any life-threatening condition present. I have reviewed and discussed all results (EKG, imaging, lab, urine as appropriate), exam findings with patient. I have reviewed nursing notes and appropriate previous records.  I feel the patient is safe to be discharged home without further emergent workup. Discussed usual and customary return precautions. Patient and family (if present) verbalize understanding and are comfortable with this plan.  Patient will follow-up with their primary care provider. If they do not have a primary care provider, information for follow-up has been provided to them. All questions  have been answered.  ____________________________________________  FINAL CLINICAL IMPRESSION(S) / ED DIAGNOSES  Final diagnoses:  Rectal bleeding    Note:  This document was prepared using Dragon voice recognition software and may include unintentional dictation errors.  Alona BeneJoshua Long, MD Emergency Medicine   Long, Arlyss RepressJoshua G, MD 01/03/18 [phone]

## 2018-01-02 NOTE — ED Triage Notes (Signed)
Pt c/o rectal bleeding with BM only x 2 days HX Hemorids

## 2018-01-02 NOTE — ED Notes (Signed)
ED Provider at bedside. 

## 2018-01-03 MED ORDER — GLIPIZIDE 5 MG PO TABS: 2.5000 mg | ORAL_TABLET | Freq: Every day | ORAL | 0 refills | Status: DC

## 2018-01-03 NOTE — Telephone Encounter (Signed)
Sorry about that - re-sent

## 2018-01-03 NOTE — Addendum Note (Signed)
Addended by: Chana BodeWINFREY, Matia Zelada B on: 01/03/2018 01:31 PM   Modules accepted: Orders

## 2018-01-03 NOTE — Telephone Encounter (Signed)
refilled 

## 2018-01-03 NOTE — Telephone Encounter (Signed)
Pt returned phone call and sch an with [facility] on 01/05/2018.

## 2018-01-05 ENCOUNTER — Ambulatory Visit (INDEPENDENT_AMBULATORY_CARE_PROVIDER_SITE_OTHER): Payer: 59 | Admitting: Internal Medicine

## 2018-01-05 VITALS — BP 151/88 | HR 80 | Temp 98.7°F | Wt 206.1 lb

## 2018-01-05 DIAGNOSIS — H539 Unspecified visual disturbance: Secondary | ICD-10-CM

## 2018-01-05 DIAGNOSIS — K5900 Constipation, unspecified: Secondary | ICD-10-CM

## 2018-01-05 DIAGNOSIS — Z794 Long term (current) use of insulin: Secondary | ICD-10-CM

## 2018-01-05 DIAGNOSIS — I10 Essential (primary) hypertension: Secondary | ICD-10-CM

## 2018-01-05 DIAGNOSIS — B351 Tinea unguium: Secondary | ICD-10-CM

## 2018-01-05 DIAGNOSIS — E119 Type 2 diabetes mellitus without complications: Secondary | ICD-10-CM

## 2018-01-05 DIAGNOSIS — Z79899 Other long term (current) drug therapy: Secondary | ICD-10-CM

## 2018-01-05 HISTORY — DX: Constipation, unspecified: K59.00

## 2018-01-05 HISTORY — DX: Unspecified visual disturbance: H53.9

## 2018-01-05 LAB — POCT GLYCOSYLATED HEMOGLOBIN (HGB A1C): Hemoglobin A1C: 8.5 % — AB (ref 4.0–5.6)

## 2018-01-05 LAB — GLUCOSE, CAPILLARY: GLUCOSE-CAPILLARY: 153 mg/dL — AB (ref 70–99)

## 2018-01-05 MED ORDER — INSULIN DETEMIR 100 UNIT/ML FLEXPEN: 30.0000 [IU] | PEN_INJECTOR | Freq: Every day | SUBCUTANEOUS | 11 refills | Status: DC

## 2018-01-05 MED ORDER — BENAZEPRIL-HYDROCHLOROTHIAZIDE 20-12.5 MG PO TABS: 1.0000 | ORAL_TABLET | Freq: Every day | ORAL | 3 refills | Status: DC

## 2018-01-05 NOTE — Assessment & Plan Note (Signed)
Works in [employer] with [employer].  Notes occasional yellow eye discharge and irritation.  Has tried eyedrops occasionally. A few days ago he was at work and used a clean towel to wipe his eyes he then experienced a "kaleidoscope colors in both eyes."  He had to go sit down, this lasted for 10 to 15 minutes and then self resolved.  His last diabetic eye exam was a year and a half ago and they saw some "dry blood vessels and some blood vessels that had burst."  Differential includes allergic conjunctivitis, chemical conjunctivitis.  Currently denies vision changes or eye pain  Plan: -Recommended ophthalmology follow-up with repeat diabetic eye exam

## 2018-01-05 NOTE — Patient Instructions (Addendum)
It was nice seeing you today. Thank you for choosing [facility] for your Primary Care.   Today we talked about:   1) Diabetes: Good job on lowering your a1c!! Please schedule an appt for another diabetic eye exam and ask them about your eye symptoms.   STOP glipizid. INCREASE glyburide-metformin to 2 pills a day. Continue 30 units lantus. Check you sugars every morning before you eat and again at night.   2) High Blood Pressure: Your goal blood pressure is LESS THAN 130/80.  STOP your single agent benazepril pill START combo pill benazepril/HCTZ once daily  3) Constipation: Eat plenty of fiber in your diet. Recommended 30 grams/day. You can also use over the counter fiber supplements like Metamucil if you'd like. If you're still not having regular bowel movements or your bowel movements are difficult, try taking a laxative such as Miralax. Don't worry about harming yourself by taking too much. Take as much as you need to get you going.     FOLLOW-UP INSTRUCTIONS When: 8/26 at 1:15pm with Dr. Frances FurbishWinfrey For: diabetes, high blood pressure What to bring: PLEASE BRING YOUR GLUCOMETER :)  Please contact the clinic if you have any problems, or need to be seen sooner.

## 2018-01-05 NOTE — Assessment & Plan Note (Signed)
151/88 today.  Uncontrolled.  He checks his blood pressure at home and reports 140s to 150s systolic.  He is currently taking benazepril 20 mg daily.  Has been on amlodipine in the past but discontinued due to erectile dysfunction.   Plan: -Stop single agent benazepril -Start combination pill benazepril-HCTZ 20-12.5 mg daily -Follow-up in 1 month with PCP

## 2018-01-05 NOTE — Assessment & Plan Note (Signed)
A1c 8.5 today.  Greatly improved from previous A1c of 12.  Although still not at goal.  He reports taking glipizide 5 mg daily.  Glyburide/metformin 5-500 mg daily, Levemir 30 units in the afternoon.  He saw endocrinology in January, medication changes were made at that visit.  However, he would like us to be the primary managers of his diabetes.  He has had a 1 symptomatic hypoglycemic events in the past several months.  It occurred at bedtime, he felt jittery, did not check his sugar and just ate something and felt better.  He checks his sugar at least once a day in the mornings but did not bring his meter with him today  Plan: -Stop glipizide -Increase glyburide-metformin 5-500 mg to twice daily: Monitor for GI side effects.  Instructed patient to inform us if he is unable to tolerate this increased dose. -Continue Levemir 30 units -Follow-up with PCP in 1 month. Patient strongly encouraged to bring home glucometer to that visit.  - recheck a1c 03/2018 -Instructed patient to schedule an eye exam

## 2018-01-05 NOTE — Assessment & Plan Note (Signed)
Endorses decreased number of bowel movements per day, straining with bowel movements and occasional bright red blood on the toilet paper.  He went to the ED recently for BRB per rectum and work-up was unremarkable.  Likely due to chronic constipation.  Plan: -Encouraged increased fiber intake in diet, can also use supplements -If still having issues recommended MiraLAX

## 2018-01-05 NOTE — Assessment & Plan Note (Signed)
>>  ASSESSMENT AND PLAN FOR DIABETES MELLITUS TYPE 2, INSULIN  DEPENDENT (HCC) WRITTEN ON 01/05/2018  1:07 PM BY VOGEL, MARIE S, MD  A1c 8.5 today.  Greatly improved from previous A1c of 12.  Although still not at goal.  He reports taking glipizide  5 mg daily.  Glyburide /metformin  5-500 mg daily, Levemir  30 units in the afternoon.  He saw endocrinology in January, medication changes were made at that visit.  However, he would like us  to be the primary managers of his diabetes.  He has had a 1 symptomatic hypoglycemic events in the past several months.  It occurred at bedtime, he felt jittery, did not check his sugar and just ate something and felt better.  He checks his sugar at least once a day in the mornings but did not bring his meter with him today  Plan: -Stop glipizide  -Increase glyburide -metformin  5-500 mg to twice daily: Monitor for GI side effects.  Instructed patient to inform us  if he is unable to tolerate this increased dose. -Continue Levemir  30 units -Follow-up with PCP in 1 month. Patient strongly encouraged to bring home glucometer to that visit.  - recheck a1c 03/2018 -Instructed patient to schedule an eye exam

## 2018-01-05 NOTE — Progress Notes (Signed)
   CC: diabetes  HPI:  [name] is a 42 y.o. male with history of high blood pressure, diabetes, erectile dysfunction presents today for management of chronic diseases.  He is accompanied by his "lady."  He is overall feeling well.    He does endorse some constipation.  He used to have bowel movements 3 times a day but now they happen 1-2 times a day.  He feels that his appetite has decreased.  Sometimes he will eat 2 meals a day.    He also endorses pain in his toe from a fungal toenail.  He has seen podiatry in the past and they cut and cleaned the toenail, but missed his follow-up appointment this week.  He says he was supposed to get some new work boots to take pressure off the toenail but has not yet.  He plans to reschedule the podiatry appointment.    He also notes occasional discharge from his eyes, yellow in color with associated cough.  He works at a [employer], primarily with acetone.  The other day he wiped his eyes with a clean towel and experienced a "kaleidoscope of colors out of both eyes."  He says this lasted for 10 or 15 minutes, he had to go sit down, and then it resolved.  He had a diabetic eye exam a year and a half ago and they saw some "dry blood vessels and some blood vessels that had burst."  He denies headache, extremity numbness, dizziness, diarrhea.  He does admit to smoking marijuana quite frequently.  Past Medical History:  Diagnosis Date  . Diabetes mellitus 2002  . Hypertension     Physical Exam:  Vitals:   01/05/18 1106  BP: (!) 151/88  Pulse: 80  Temp: 98.7 F (37.1 C)  TempSrc: Oral  SpO2: 100%  Weight: 206 lb 1.6 oz (93.5 kg)   Gen: Well appearing, NAD Eyes: slightly red conjunctiva, no discharge CV: RRR, no murmurs Pulm: Normal effort, CTA throughout, no wheezing Ext: Warm, no edema, normal joints  Skin:  No rashes   Assessment & Plan:   See Encounters Tab for problem based charting.  Patient seen with Dr. Sandre Kittyaines

## 2018-01-08 NOTE — Progress Notes (Signed)
Internal Medicine Clinic Attending  I saw and evaluated the patient.  I personally confirmed the key portions of the history and exam documented by Dr. Petra KubaVogel and I reviewed pertinent patient test results.  The assessment, diagnosis, and plan were formulated together and I agree with the documentation in the resident's note.  Uncontrolled HTN and DM, but difficult situation as he (understandably) does not want to take more medicines. Willing to consider changing to combination pill for HTN, will avoid amlodipine as he felt it caused ED. Also unclear why he is on both glyburide and glipizide, will d/c glipizide and increase glyburide/metformin. Would like to prescribe miralax or fiber, but he preferred to acquire over the counter.   Jessy OtoAlexander Raines, M.D., Ph.D.

## 2018-02-05 ENCOUNTER — Other Ambulatory Visit: Payer: Self-pay

## 2018-02-05 ENCOUNTER — Ambulatory Visit (INDEPENDENT_AMBULATORY_CARE_PROVIDER_SITE_OTHER): Payer: 59 | Admitting: Internal Medicine

## 2018-02-05 ENCOUNTER — Other Ambulatory Visit: Payer: Self-pay | Admitting: Internal Medicine

## 2018-02-05 ENCOUNTER — Encounter: Payer: Self-pay | Admitting: Internal Medicine

## 2018-02-05 VITALS — BP 147/85 | HR 98 | Temp 99.1°F | Ht 74.0 in | Wt 204.5 lb

## 2018-02-05 DIAGNOSIS — E119 Type 2 diabetes mellitus without complications: Secondary | ICD-10-CM | POA: Diagnosis not present

## 2018-02-05 DIAGNOSIS — I10 Essential (primary) hypertension: Secondary | ICD-10-CM

## 2018-02-05 DIAGNOSIS — Z794 Long term (current) use of insulin: Secondary | ICD-10-CM | POA: Diagnosis not present

## 2018-02-05 DIAGNOSIS — Z23 Encounter for immunization: Secondary | ICD-10-CM

## 2018-02-05 DIAGNOSIS — Z720 Tobacco use: Secondary | ICD-10-CM | POA: Diagnosis not present

## 2018-02-05 DIAGNOSIS — Z79899 Other long term (current) drug therapy: Secondary | ICD-10-CM

## 2018-02-05 LAB — GLUCOSE, CAPILLARY: GLUCOSE-CAPILLARY: 225 mg/dL — AB (ref 70–99)

## 2018-02-05 NOTE — Assessment & Plan Note (Signed)
>>  ASSESSMENT AND PLAN FOR DIABETES MELLITUS TYPE 2, INSULIN  DEPENDENT ([facility]) WRITTEN ON 02/05/2018  5:34 PM BY Cherylene Corrente, MD  Lab Results  Component Value Date   HGBA1C 8.5 (A) 01/05/2018   Last visit A1C was improved.  Additionally, metformin  glyburide  increased to 5-500mg  BID.  Also on Levemir  30 units daily.   He reports his morning blood sugar annually once he checks are actually 130-160.    -He did not bring any blood sugar log today or his meter -Continue regimen above for now -Given instructions to check his blood sugar at least 3 times a day of the week leading up until his visit -eye exam for diabetic retinopathy

## 2018-02-05 NOTE — Patient Instructions (Signed)
Mr. Carlos [name], please start that blood pressure medicine we started you on at your last visit.  We will check your blood pressure and lab work when you return in one month.  Also please take your blood sugar 3 times a day the week leading up to your visit and keep a log of it first thing in the am, 2 hour after lunch and before bedtime.

## 2018-02-05 NOTE — Assessment & Plan Note (Addendum)
Lab Results  Component Value Date   HGBA1C 8.5 (A) 01/05/2018   Last visit A1C was improved.  Additionally, metformin glyburide increased to 5-500mg  BID.  Also on Levemir 30 units daily.   He reports his morning blood sugar annually once he checks are actually 130-160.    -He did not bring any blood sugar log today or his meter -Continue regimen above for now -Given instructions to check his blood sugar at least 3 times a day of the week leading up until his visit -eye exam for diabetic retinopathy

## 2018-02-05 NOTE — Assessment & Plan Note (Addendum)
BP Readings from Last 3 Encounters:  01/05/18 (!) 151/88  01/02/18 (!) 172/99  08/24/17 (!) 174/103   BP still high today, patient reports he never started on the Lotensin.  Still thinks that amlodipine was causing his erectile dysfunction.    -continue Lotensin HCT 20-12.5 daily -Recheck BP after patient is actually on the medicine 1 month -We will check a BMP at that visit as well

## 2018-02-05 NOTE — Assessment & Plan Note (Signed)
Still smoking 1pack cigars per week.  He feels this is an improvement given that he used to smoke cigarettes daily.  He smokes marijuana almost daily.    -He is not interested in any kind of nicotine supplements to help curb his cravings he says he will stop cold [place] he is done it in the past and he can do it again

## 2018-02-05 NOTE — Progress Notes (Signed)
   CC: Follow up of T2DM, HTN, tobacco abuse  HPI:  Mr.Nettie Derrell Lollingngram is a 42 y.o. male with PMH below.  He is here to follow up on his HTN, tobacco abuse and T2DM.  Please see A&P for status of the patient's chronic medical conditions  Past Medical History:  Diagnosis Date  . Diabetes mellitus 2002  . Hypertension    Review of Systems:  ROS: Pulmonary: pt denies increased work of breathing, shortness of breath,  Cardiac: pt denies palpitations, chest pain,  Abdominal: pt denies abdominal pain, nausea, vomiting, or diarrhea  Physical Exam:  Vitals:   02/05/18 1334  BP: (!) 147/85  Pulse: 98  Temp: 99.1 F (37.3 C)  SpO2: 100%  Weight: 204 lb 8 oz (92.8 kg)  Height: 6\' 2"  (1.88 m)   Cardiac: normal rate and rhythm, clear s1 and s2 Pulmonary: CTAB, not in distress Abdominal: non distended abdomen, soft and nontender Extremities: no LE edema Mental status: Alert, conversant, in good spirits  Social History   Socioeconomic History  . Marital status: Married    Spouse name: Not on file  . Number of children: Not on file  . Years of education: Not on file  . Highest education level: Not on file  Occupational History  . Not on file  Social Needs  . Financial resource strain: Not on file  . Food insecurity:    Worry: Not on file    Inability: Not on file  . Transportation needs:    Medical: Not on file    Non-medical: Not on file  Tobacco Use  . Smoking status: Current Some Day Smoker    Packs/day: 0.10    Years: 10.00    Pack years: 1.00    Types: Cigarettes    Last attempt to quit: 12/22/2015    Years since quitting: 2.1  . Smokeless tobacco: Never Used  . Tobacco comment: Recently quit  Substance and Sexual Activity  . Alcohol use: Yes    Comment: Sometimes.  . Drug use: Yes    Types: Marijuana    Comment: Occasionally.  . Sexual activity: Not on file  Lifestyle  . Physical activity:    Days per week: Not on file    Minutes per session: Not on file   . Stress: Not on file  Relationships  . Social connections:    Talks on phone: Not on file    Gets together: Not on file    Attends religious service: Not on file    Active member of club or organization: Not on file    Attends meetings of clubs or organizations: Not on file    Relationship status: Not on file  . Intimate partner violence:    Fear of current or ex partner: Not on file    Emotionally abused: Not on file    Physically abused: Not on file    Forced sexual activity: Not on file  Other Topics Concern  . Not on file  Social History Narrative  . Not on file    No family history on file.  Assessment & Plan:   See Encounters Tab for problem based charting.  Patient discussed with Dr. Sandre Kittyaines

## 2018-02-13 ENCOUNTER — Encounter: Payer: 59 | Admitting: Dietician

## 2018-02-15 NOTE — Progress Notes (Signed)
Internal Medicine Clinic Attending  Case discussed with Dr. Winfrey at the time of the visit.  We reviewed the resident's history and exam and pertinent patient test results.  I agree with the assessment, diagnosis, and plan of care documented in the resident's note.  Alexander Raines, M.D., Ph.D.  

## 2018-02-17 ENCOUNTER — Other Ambulatory Visit: Payer: Self-pay

## 2018-02-17 ENCOUNTER — Emergency Department (HOSPITAL_BASED_OUTPATIENT_CLINIC_OR_DEPARTMENT_OTHER)
Admission: EM | Admit: 2018-02-17 | Discharge: 2018-02-17 | Disposition: A | Discharge status: Home / Self Care | Payer: 59 | Attending: Emergency Medicine | Admitting: Emergency Medicine

## 2018-02-17 ENCOUNTER — Encounter (HOSPITAL_BASED_OUTPATIENT_CLINIC_OR_DEPARTMENT_OTHER): Payer: Self-pay | Admitting: *Deleted

## 2018-02-17 DIAGNOSIS — M79661 Pain in right lower leg: Secondary | ICD-10-CM | POA: Diagnosis not present

## 2018-02-17 DIAGNOSIS — Z5321 Procedure and treatment not carried out due to patient leaving prior to being seen by health care provider: Secondary | ICD-10-CM | POA: Diagnosis not present

## 2018-02-17 LAB — CBG MONITORING, ED: GLUCOSE-CAPILLARY: 117 mg/dL — AB (ref 70–99)

## 2018-02-17 NOTE — ED Notes (Signed)
Pt called for room, no answer. Not found in lobby at this time

## 2018-02-17 NOTE — ED Triage Notes (Signed)
Pt reports pain in posterior right calf x 3 days. "Feels like a cramp". Pt is concerned for blood clot. Denies recent travel

## 2018-02-17 NOTE — ED Notes (Signed)
3rd call no answer.

## 2018-02-17 NOTE — ED Notes (Signed)
Called x 2 no answer

## 2018-02-19 ENCOUNTER — Other Ambulatory Visit: Payer: Self-pay | Admitting: Internal Medicine

## 2018-02-19 DIAGNOSIS — E119 Type 2 diabetes mellitus without complications: Secondary | ICD-10-CM

## 2018-02-19 DIAGNOSIS — Z794 Long term (current) use of insulin: Principal | ICD-10-CM

## 2018-02-27 ENCOUNTER — Other Ambulatory Visit: Payer: Self-pay | Admitting: *Deleted

## 2018-02-27 MED ORDER — ATORVASTATIN CALCIUM 40 MG PO TABS: 40.0000 mg | ORAL_TABLET | Freq: Every day | ORAL | 1 refills | Status: DC

## 2018-02-27 NOTE — Telephone Encounter (Signed)
Yes lets refill it

## 2018-02-27 NOTE — Telephone Encounter (Signed)
REC'D REQUEST FOR REFILL OF ATORVASTATIN 40mg , looks like a nurse possibly in ED discontinued it during an ED visit when I reviewed the med history. Do you want to refill this?

## 2018-03-19 ENCOUNTER — Encounter: Payer: Self-pay | Admitting: Internal Medicine

## 2018-03-19 ENCOUNTER — Encounter: Payer: 59 | Admitting: Dietician

## 2018-03-19 ENCOUNTER — Encounter: Payer: 59 | Admitting: Internal Medicine

## 2018-03-19 NOTE — Progress Notes (Deleted)
   CC: T2DM, HTN  HPI:  [name] is a 42 y.o. male with PMH below.  Today we will address T2DM, HTN.     T2DM:   Lab Results  Component Value Date   HGBA1C 8.5 (A) 01/05/2018    Today his A1C is ***.  We did not change his regimen last visit as he had no blood sugar logs or meter.  Was not checking his blood sugar regularly.    Please see A&P for status of the patient's chronic medical conditions  Past Medical History:  Diagnosis Date  . Diabetes mellitus 2002  . Hypertension    Review of Systems:  ***  Physical Exam:  There were no vitals filed for this visit. ***  Social History   Socioeconomic History  . Marital status: Married    Spouse name: Not on file  . Number of children: Not on file  . Years of education: Not on file  . Highest education level: Not on file  Occupational History  . Not on file  Social Needs  . Financial resource strain: Not on file  . Food insecurity:    Worry: Not on file    Inability: Not on file  . Transportation needs:    Medical: Not on file    Non-medical: Not on file  Tobacco Use  . Smoking status: Current Some Day Smoker    Packs/day: 0.10    Years: 10.00    Pack years: 1.00    Types: Cigars    Last attempt to quit: 12/22/2015    Years since quitting: 2.2  . Smokeless tobacco: Never Used  . Tobacco comment: Recently quit  Substance and Sexual Activity  . Alcohol use: Yes    Comment: OccAsional  . Drug use: Yes    Types: Marijuana    Comment: Occasionally.  . Sexual activity: Not on file  Lifestyle  . Physical activity:    Days per week: Not on file    Minutes per session: Not on file  . Stress: Not on file  Relationships  . Social connections:    Talks on phone: Not on file    Gets together: Not on file    Attends religious service: Not on file    Active member of club or organization: Not on file    Attends meetings of clubs or organizations: Not on file    Relationship status: Not on file  .  Intimate partner violence:    Fear of current or ex partner: Not on file    Emotionally abused: Not on file    Physically abused: Not on file    Forced sexual activity: Not on file  Other Topics Concern  . Not on file  Social History Narrative  . Not on file   *** No family history on file.  Assessment & Plan:   See Encounters Tab for problem based charting.  Patient {GC/GE:[id number]::"discussed with","seen with"} Dr. {NAMES:[id number]::"Butcher","Granfortuna","E. Hoffman","Klima","Mullen","Narendra","Raines","Vincent"}

## 2018-03-20 NOTE — Addendum Note (Signed)
Addended by: Neomia Dear on: 03/20/2018 07:09 PM   Modules accepted: Orders

## 2018-03-26 NOTE — Addendum Note (Signed)
Addended by: Neomia Dear on: 03/26/2018 07:21 PM   Modules accepted: Orders
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
## 2018-06-21 ENCOUNTER — Other Ambulatory Visit: Payer: Self-pay | Admitting: Internal Medicine

## 2018-06-21 DIAGNOSIS — I10 Essential (primary) hypertension: Secondary | ICD-10-CM

## 2018-06-21 MED ORDER — BENAZEPRIL-HYDROCHLOROTHIAZIDE 20-12.5 MG PO TABS
1.0000 | ORAL_TABLET | Freq: Every day | ORAL | 1 refills | Status: DC
Start: 2018-06-21 — End: 2018-06-25

## 2018-06-21 NOTE — Telephone Encounter (Signed)
refilled 

## 2018-06-25 ENCOUNTER — Other Ambulatory Visit: Payer: Self-pay

## 2018-06-25 ENCOUNTER — Ambulatory Visit (INDEPENDENT_AMBULATORY_CARE_PROVIDER_SITE_OTHER): Payer: Self-pay | Admitting: Internal Medicine

## 2018-06-25 ENCOUNTER — Encounter (INDEPENDENT_AMBULATORY_CARE_PROVIDER_SITE_OTHER): Payer: Self-pay

## 2018-06-25 VITALS — BP 138/82 | HR 90 | Temp 99.5°F | Ht 72.0 in | Wt 198.8 lb

## 2018-06-25 DIAGNOSIS — L97529 Non-pressure chronic ulcer of other part of left foot with unspecified severity: Secondary | ICD-10-CM

## 2018-06-25 DIAGNOSIS — Z79899 Other long term (current) drug therapy: Secondary | ICD-10-CM

## 2018-06-25 DIAGNOSIS — Z7984 Long term (current) use of oral hypoglycemic drugs: Secondary | ICD-10-CM

## 2018-06-25 DIAGNOSIS — Z9114 Patient's other noncompliance with medication regimen: Secondary | ICD-10-CM

## 2018-06-25 DIAGNOSIS — I10 Essential (primary) hypertension: Secondary | ICD-10-CM

## 2018-06-25 DIAGNOSIS — E11621 Type 2 diabetes mellitus with foot ulcer: Secondary | ICD-10-CM

## 2018-06-25 DIAGNOSIS — E119 Type 2 diabetes mellitus without complications: Secondary | ICD-10-CM

## 2018-06-25 DIAGNOSIS — Z794 Long term (current) use of insulin: Secondary | ICD-10-CM

## 2018-06-25 LAB — POCT GLYCOSYLATED HEMOGLOBIN (HGB A1C): Hemoglobin A1C: 7.8 % — AB (ref 4.0–5.6)

## 2018-06-25 LAB — GLUCOSE, CAPILLARY: Glucose-Capillary: 182 mg/dL — ABNORMAL HIGH (ref 70–99)

## 2018-06-25 MED ORDER — GLYBURIDE-METFORMIN 5-500 MG PO TABS: 1.0000 | ORAL_TABLET | Freq: Two times a day (BID) | ORAL | 3 refills | Status: DC

## 2018-06-25 MED ORDER — BENAZEPRIL-HYDROCHLOROTHIAZIDE 20-12.5 MG PO TABS: 1.0000 | ORAL_TABLET | Freq: Every day | ORAL | 1 refills | Status: DC

## 2018-06-25 NOTE — Patient Instructions (Addendum)
Carlos [name]  Thank you for coming in to the clinic. Here are our recommendations:  - Please continue to monitor your blood glucose and continue your dietary changes - Please start taking your blood pressure medication (Benazepril-HCTZ) 1.5 tablet a day - Please come back in 3 months to check your a1c - We will call you with your lab results.  It was pleasure seeing you in the clinic today.   DASH Eating Plan DASH stands for "Dietary Approaches to Stop Hypertension." The DASH eating plan is a healthy eating plan that has been shown to reduce high blood pressure (hypertension). It may also reduce your risk for type 2 diabetes, heart disease, and stroke. The DASH eating plan may also help with weight loss. What are tips for following this plan?  General guidelines  Avoid eating more than 2,300 mg (milligrams) of salt (sodium) a day. If you have hypertension, you may need to reduce your sodium intake to 1,500 mg a day.  Limit alcohol intake to no more than 1 drink a day for nonpregnant women and 2 drinks a day for men. One drink equals 12 oz of beer, 5 oz of wine, or 1 oz of hard liquor.  Work with your health care provider to maintain a healthy body weight or to lose weight. Ask what an ideal weight is for you.  Get at least 30 minutes of exercise that causes your heart to beat faster (aerobic exercise) most days of the week. Activities may include walking, swimming, or biking.  Work with your health care provider or diet and nutrition specialist (dietitian) to adjust your eating plan to your individual calorie needs. Reading food labels   Check food labels for the amount of sodium per serving. Choose foods with less than 5 percent of the Daily Value of sodium. Generally, foods with less than 300 mg of sodium per serving fit into this eating plan.  To find whole grains, look for the word "whole" as the first word in the ingredient list. Shopping  Buy products labeled as "low-sodium" or  "no salt added."  Buy fresh foods. Avoid canned foods and premade or frozen meals. Cooking  Avoid adding salt when cooking. Use salt-free seasonings or herbs instead of table salt or sea salt. Check with your health care provider or pharmacist before using salt substitutes.  Do not fry foods. Cook foods using healthy methods such as baking, boiling, grilling, and broiling instead.  Cook with heart-healthy oils, such as olive, canola, soybean, or sunflower oil. Meal planning  Eat a balanced diet that includes: ? 5 or more servings of fruits and vegetables each day. At each meal, try to fill half of your plate with fruits and vegetables. ? Up to 6-8 servings of whole grains each day. ? Less than 6 oz of lean meat, poultry, or fish each day. A 3-oz serving of meat is about the same size as a deck of cards. One egg equals 1 oz. ? 2 servings of low-fat dairy each day. ? A serving of nuts, seeds, or beans 5 times each week. ? Heart-healthy fats. Healthy fats called Omega-3 fatty acids are found in foods such as flaxseeds and coldwater fish, like sardines, salmon, and mackerel.  Limit how much you eat of the following: ? Canned or prepackaged foods. ? Food that is high in trans fat, such as fried foods. ? Food that is high in saturated fat, such as fatty meat. ? Sweets, desserts, sugary drinks, and other foods with added  sugar. ? Full-fat dairy products.  Do not salt foods before eating.  Try to eat at least 2 vegetarian meals each week.  Eat more home-cooked food and less restaurant, buffet, and fast food.  When eating at a restaurant, ask that your food be prepared with less salt or no salt, if possible. What foods are recommended? The items listed may not be a complete list. Talk with your dietitian about what dietary choices are best for you. Grains Whole-grain or whole-wheat bread. Whole-grain or whole-wheat pasta. Brown rice. [name]. Bulgur. Whole-grain and low-sodium  cereals. Pita bread. Low-fat, low-sodium crackers. Whole-wheat flour tortillas. Vegetables Fresh or frozen vegetables (raw, steamed, roasted, or grilled). Low-sodium or reduced-sodium tomato and vegetable juice. Low-sodium or reduced-sodium tomato sauce and tomato paste. Low-sodium or reduced-sodium canned vegetables. Fruits All fresh, dried, or frozen fruit. Canned fruit in natural juice (without added sugar). Meat and other protein foods Skinless chicken or [place]. Ground chicken or [place]. Pork with fat trimmed off. Fish and seafood. Egg whites. Dried beans, peas, or lentils. Unsalted nuts, nut butters, and seeds. Unsalted canned beans. Lean cuts of beef with fat trimmed off. Low-sodium, lean deli meat. Dairy Low-fat (1%) or fat-free (skim) milk. Fat-free, low-fat, or reduced-fat cheeses. Nonfat, low-sodium ricotta or cottage cheese. Low-fat or nonfat yogurt. Low-fat, low-sodium cheese. Fats and oils Soft margarine without trans fats. Vegetable oil. Low-fat, reduced-fat, or light mayonnaise and salad dressings (reduced-sodium). Canola, safflower, olive, soybean, and sunflower oils. Avocado. Seasoning and other foods Herbs. Spices. Seasoning mixes without salt. Unsalted popcorn and pretzels. Fat-free sweets. What foods are not recommended? The items listed may not be a complete list. Talk with your dietitian about what dietary choices are best for you. Grains Baked goods made with fat, such as croissants, muffins, or some breads. Dry pasta or rice meal packs. Vegetables Creamed or fried vegetables. Vegetables in a cheese sauce. Regular canned vegetables (not low-sodium or reduced-sodium). Regular canned tomato sauce and paste (not low-sodium or reduced-sodium). Regular tomato and vegetable juice (not low-sodium or reduced-sodium). Carlos [name]. Olives. Fruits Canned fruit in a light or heavy syrup. Fried fruit. Fruit in cream or butter sauce. Meat and other protein foods Fatty cuts of meat. Ribs.  Fried meat. Carlos [name]. Sausage. Bologna and other processed lunch meats. Salami. Fatback. Hotdogs. Bratwurst. Salted nuts and seeds. Canned beans with added salt. Canned or smoked fish. Whole eggs or egg yolks. Chicken or [place] with skin. Dairy Whole or 2% milk, cream, and half-and-half. Whole or full-fat cream cheese. Whole-fat or sweetened yogurt. Full-fat cheese. Nondairy creamers. Whipped toppings. Processed cheese and cheese spreads. Fats and oils Butter. Stick margarine. Lard. Shortening. Ghee. Bacon fat. Tropical oils, such as coconut, palm kernel, or palm oil. Seasoning and other foods Salted popcorn and pretzels. Onion salt, garlic salt, seasoned salt, table salt, and sea salt. Worcestershire sauce. Tartar sauce. Barbecue sauce. Teriyaki sauce. Soy sauce, including reduced-sodium. Steak sauce. Canned and packaged gravies. Fish sauce. Oyster sauce. Cocktail sauce. Horseradish that you find on the shelf. Ketchup. Mustard. Meat flavorings and tenderizers. Bouillon cubes. Hot sauce and Tabasco sauce. Premade or packaged marinades. Premade or packaged taco seasonings. Relishes. Regular salad dressings. Where to find more information:  National Heart, Lung, and [place]: https://wilson-eaton.com/  American Heart Association: www.heart.org Summary  The DASH eating plan is a healthy eating plan that has been shown to reduce high blood pressure (hypertension). It may also reduce your risk for type 2 diabetes, heart disease, and stroke.  With the DASH eating  plan, you should limit salt (sodium) intake to 2,300 mg a day. If you have hypertension, you may need to reduce your sodium intake to 1,500 mg a day.  When on the DASH eating plan, aim to eat more fresh fruits and vegetables, whole grains, lean proteins, low-fat dairy, and heart-healthy fats.  Work with your health care provider or diet and nutrition specialist (dietitian) to adjust your eating plan to your individual calorie needs. This  information is not intended to replace advice given to you by your health care provider. Make sure you discuss any questions you have with your health care provider. Document Released: 05/19/2011 Document Revised: 05/23/2016 Document Reviewed: 05/23/2016 Elsevier Interactive Patient Education  2019 [employer].

## 2018-06-25 NOTE — Assessment & Plan Note (Addendum)
Lab Results  Component Value Date   HGBA1C 7.8 (A) 06/25/2018   Improved hemoglobin a1c (last a1c 8.5 01/05/18) but continuing to be above goal. States stopped taking insulin all together for last 3 months due to dislike of subcutaneous injections. Mentions daily monitoring of his blood glucose but did not bring glucometer this visit. States sugars around 100-120 fasting. He has consistently lost weight (13 lbs in 12 months) and states would like to focus on dietary changes. When discussing alternative non-subcutaneous therapies for diabetes management such as SGLT2 inhibitors, he states since his initial diagnosis, this is the best his sugars have been and states trying new therapies tend to cause unintentional fluctuations in his bg. States he would refuse alternative therapies and continue to focus on lifestyle and behavior modifications for T2DM management. Counseled patient on reducing carbohydrate intake and discussed continuing to monitor hgb a1c. Physical exam show intact sensation but a dry ulcer between toes. States he has upcoming appt with optometrist.  - C/w glyburide-metformin 5-500mg  BID - Return to clinic with glucometer - Referral for podiatry - Lipid panel today  Addendum:  Lipid panel show elevated TC & LDL of 141. ACC/AHA risk calculator show ASCVD 10 year risk of 21.3% Will call patient and recommend high-intensity statin.

## 2018-06-25 NOTE — Assessment & Plan Note (Addendum)
BP Readings from Last 3 Encounters:  06/25/18 138/82  02/17/18 (!) 145/84  02/05/18 (!) 147/85   Bp improved from prior but continuing to be above goal. States he takes benazepril-HCTZ as prescribed. States he checks his bp at home and is around 130-140s systolic and 80s diastolic. Described need for improved BP control especially considering that he has diabetes. States his bp is lower at home but tends to be elevated at doctor's office. Discussed option of adding other anti-hypertensives to his regimen. He states he would like to avoid adding additional medications to avoid unwanted side effects and would like to focus on lifestyle changes. He state he would be willing to try higher dose of Lotensin. Discussed importance of avoiding tobacco use and high salt diet.   - Educated pt on DASH diet - Benazepril-HCTZ 30-18 daily (1.5 tablets) - Re-check BP at f/u visit - BMP today

## 2018-06-25 NOTE — Assessment & Plan Note (Signed)
>>  ASSESSMENT AND PLAN FOR DIABETES MELLITUS TYPE 2, INSULIN  DEPENDENT (HCC) WRITTEN ON 06/26/2018 11:51 AM BY LEE, JOSHUA K, MD  Lab Results  Component Value Date   HGBA1C 7.8 (A) 06/25/2018   Improved hemoglobin a1c (last a1c 8.5 01/05/18) but continuing to be above goal. States stopped taking insulin  all together for last 3 months due to dislike of subcutaneous injections. Mentions daily monitoring of his blood glucose but did not bring glucometer this visit. States sugars around 100-120 fasting. He has consistently lost weight (13 lbs in 12 months) and states would like to focus on dietary changes. When discussing alternative non-subcutaneous therapies for diabetes management such as SGLT2 inhibitors, he states since his initial diagnosis, this is the best his sugars have been and states trying new therapies tend to cause unintentional fluctuations in his bg. States he would refuse alternative therapies and continue to focus on lifestyle and behavior modifications for T2DM management. Counseled patient on reducing carbohydrate intake and discussed continuing to monitor hgb a1c. Physical exam show intact sensation but a dry ulcer between toes. States he has upcoming appt with optometrist.  - C/w glyburide -metformin  5-500mg  BID - Return to clinic with glucometer - Referral for podiatry - Lipid panel today  Addendum:  Lipid panel show elevated TC & LDL of 141. ACC/AHA risk calculator show ASCVD 10 year risk of 21.3% Will call patient and recommend high-intensity statin.

## 2018-06-25 NOTE — Progress Notes (Addendum)
CC: Type 2 Diabetes Mellitus  HPI: [name] is a 43 y.o. M w/ PMH of HTN and T2DM presenting to the clinic for management of his chronic conditions. He mentions that since his last visit, he stopped taking his insulin because he disliked doing injections. He has been regularly checking his blood glucose in morning and night time and has seen 100-120s in AM and 160-180s in PM. He was unable to bring his glucometer to this visit to confirm. He is continuing to take his glyburide-metformin and has some loose stools but states these side effects are manageable. He denies any significant hypoglycemic events. He states he has significantly changed his diet and has been losing weight consistently with lifestyle modifications and would like to focus on these lifestyle changes rather than rely on pharmaceutical interventions. Denies any chest pain, palpitations, dyspnea, orthopnea.  Past Medical History:  Diagnosis Date  . Diabetes mellitus 2002  . Hypertension    Review of Systems: Review of Systems  Constitutional: Positive for weight loss (intentional w/ diet). Negative for chills, fever and malaise/fatigue.  Eyes: Negative for blurred vision.  Respiratory: Negative for shortness of breath.   Cardiovascular: Negative for chest pain, palpitations and leg swelling.  Gastrointestinal: Negative for constipation, diarrhea, nausea and vomiting.  Genitourinary: Negative for dysuria, frequency and urgency.  Neurological: Negative for dizziness, tingling, sensory change, focal weakness and headaches.    Physical Exam: Vitals:   06/25/18 1353 06/25/18 1438  BP: (!) 174/86 138/82  Pulse: 90   Temp: 99.5 F (37.5 C)   TempSrc: Oral   SpO2: 100%   Weight: 198 lb 12.8 oz (90.2 kg)   Height: 6' (1.829 m)    Physical Exam  Constitutional: He is oriented to person, place, and time. He appears well-developed and well-nourished. No distress.  HENT:  Mouth/Throat: Oropharynx is clear and moist.   Eyes: Conjunctivae are normal. No scleral icterus.  Neck: Normal range of motion. Neck supple.  Cardiovascular: Normal rate, regular rhythm, normal heart sounds and intact distal pulses.  No murmur heard. Respiratory: Effort normal and breath sounds normal. He has no wheezes. He has no rales.  GI: Soft. Bowel sounds are normal. He exhibits no distension. There is no abdominal tenderness.  Musculoskeletal: Normal range of motion.        General: No edema.  Neurological: He is alert and oriented to person, place, and time.  Skin: Skin is warm and dry.  Superficial ulcer noted on Left foot between 3rd and 4th toe with pale base w/o erythema, surrounding exudates, warmth or tenderness.      Assessment & Plan:   Hypertension, essential BP Readings from Last 3 Encounters:  06/25/18 138/82  02/17/18 (!) 145/84  02/05/18 (!) 147/85   Bp improved from prior but continuing to be above goal. States he takes benazepril-HCTZ as prescribed. States he checks his bp at home and is around 130-140s systolic and 80s diastolic. Described need for improved BP control especially considering that he has diabetes. States his bp is lower at home but tends to be elevated at doctor's office. Discussed option of adding other anti-hypertensives to his regimen. He states he would like to avoid adding additional medications to avoid unwanted side effects and would like to focus on lifestyle changes. He state he would be willing to try higher dose of Lotensin. Discussed importance of avoiding tobacco use and high salt diet.   - Educated pt on DASH diet - Benazepril-HCTZ 30-18 daily (1.5 tablets) -  Re-check BP at f/u visit - BMP today  Diabetes mellitus type 2, insulin dependent (HCC) Lab Results  Component Value Date   HGBA1C 7.8 (A) 06/25/2018   Improved hemoglobin a1c (last a1c 8.5 01/05/18) but continuing to be above goal. States stopped taking insulin all together for last 3 months due to dislike of  subcutaneous injections. Mentions daily monitoring of his blood glucose but did not bring glucometer this visit. States sugars around 100-120 fasting. He has consistently lost weight (13 lbs in 12 months) and states would like to focus on dietary changes. When discussing alternative non-subcutaneous therapies for diabetes management such as SGLT2 inhibitors, he states since his initial diagnosis, this is the best his sugars have been and states trying new therapies tend to cause unintentional fluctuations in his bg. States he would refuse alternative therapies and continue to focus on lifestyle and behavior modifications for T2DM management. Counseled patient on reducing carbohydrate intake and discussed continuing to monitor hgb a1c. Physical exam show intact sensation but a dry ulcer between toes. States he has upcoming appt with optometrist.  - C/w glyburide-metformin 5-500mg  BID - Return to clinic with glucometer - Referral for podiatry - Lipid panel today  Addendum:  Lipid panel show elevated TC & LDL of 141. ACC/AHA risk calculator show ASCVD 10 year risk of 21.3% Will call patient and recommend high-intensity statin.    Patient discussed with Dr. Rogelia Boga   -Judeth Cornfield, PGY1

## 2018-06-26 ENCOUNTER — Telehealth: Payer: Self-pay | Admitting: Internal Medicine

## 2018-06-26 ENCOUNTER — Encounter: Payer: Self-pay | Admitting: *Deleted

## 2018-06-26 ENCOUNTER — Telehealth: Payer: Self-pay | Admitting: *Deleted

## 2018-06-26 LAB — BMP8+ANION GAP
Anion Gap: 13 mmol/L (ref 10.0–18.0)
BUN/Creatinine Ratio: 14 (ref 9–20)
BUN: 13 mg/dL (ref 6–24)
CO2: 26 mmol/L (ref 20–29)
Calcium: 9.9 mg/dL (ref 8.7–10.2)
Chloride: 104 mmol/L (ref 96–106)
Creatinine, Ser: 0.95 mg/dL (ref 0.76–1.27)
GFR calc Af Amer: 113 mL/min/{1.73_m2} (ref >59)
GFR calc non Af Amer: 98 mL/min/{1.73_m2} (ref >59)
Glucose: 153 mg/dL — ABNORMAL HIGH (ref 65–99)
Potassium: 4.3 mmol/L (ref 3.5–5.2)
Sodium: 143 mmol/L (ref 134–144)

## 2018-06-26 LAB — LIPID PANEL
Chol/HDL Ratio: 3.9 ratio (ref 0.0–5.0)
Cholesterol, Total: 207 mg/dL — ABNORMAL HIGH (ref 100–199)
HDL: 53 mg/dL (ref >39)
LDL Calculated: 141 mg/dL — ABNORMAL HIGH (ref 0–99)
Triglycerides: 67 mg/dL (ref 0–149)
VLDL Cholesterol Cal: 13 mg/dL (ref 5–40)

## 2018-06-26 NOTE — Telephone Encounter (Signed)
UNABLE TO CONTACT PATIENT BY PHONE. LETTER SENT TO PATIENT AS TO WHERE HE IS WANTING TO BE SENT FOR PODIATRY REFERRAL.

## 2018-06-27 NOTE — Progress Notes (Signed)
Internal Medicine Clinic Attending ° °Case discussed with Dr. Lee at the time of the visit.  We reviewed the resident’s history and exam and pertinent patient test results.  I agree with the assessment, diagnosis, and plan of care documented in the resident’s note.  °

## 2018-06-29 NOTE — Telephone Encounter (Signed)
Opened in error

## 2018-07-03 ENCOUNTER — Telehealth: Payer: Self-pay | Admitting: Internal Medicine

## 2018-07-03 NOTE — Telephone Encounter (Signed)
Attempted to call patient about starting high-intensity statin for hyperlipidemia. Patient did not pick up and voicemail box is not set up.

## 2018-07-05 ENCOUNTER — Telehealth: Payer: Self-pay | Admitting: Internal Medicine

## 2018-07-16 ENCOUNTER — Encounter: Payer: Self-pay | Admitting: *Deleted

## 2018-08-13 ENCOUNTER — Encounter: Payer: Self-pay | Admitting: Internal Medicine

## 2018-08-16 ENCOUNTER — Other Ambulatory Visit: Payer: Self-pay | Admitting: Internal Medicine

## 2018-08-16 NOTE — Telephone Encounter (Signed)
Pls contact pt [phone], he wants to new prescription sent to the pharmacy for his blood pressure medicine, [id number] - [place], [place] - [address]

## 2018-08-16 NOTE — Telephone Encounter (Signed)
Called [facility], per pharmacist the prescription had been deactivated, he could not find what the reason was, he reactivated the script and pt may pick up med Attempted to call pt at ph# attached to note, vmail has not been setup

## 2018-08-27 ENCOUNTER — Encounter: Payer: Self-pay | Admitting: Internal Medicine

## 2018-10-05 ENCOUNTER — Emergency Department (HOSPITAL_BASED_OUTPATIENT_CLINIC_OR_DEPARTMENT_OTHER)
Admission: EM | Admit: 2018-10-05 | Discharge: 2018-10-05 | Disposition: A | Discharge status: Home / Self Care | Payer: Self-pay | Attending: Emergency Medicine | Admitting: Emergency Medicine

## 2018-10-05 ENCOUNTER — Other Ambulatory Visit: Payer: Self-pay

## 2018-10-05 ENCOUNTER — Encounter (HOSPITAL_BASED_OUTPATIENT_CLINIC_OR_DEPARTMENT_OTHER): Payer: Self-pay | Admitting: Emergency Medicine

## 2018-10-05 DIAGNOSIS — F17210 Nicotine dependence, cigarettes, uncomplicated: Secondary | ICD-10-CM | POA: Insufficient documentation

## 2018-10-05 DIAGNOSIS — Z79899 Other long term (current) drug therapy: Secondary | ICD-10-CM | POA: Insufficient documentation

## 2018-10-05 DIAGNOSIS — I10 Essential (primary) hypertension: Secondary | ICD-10-CM | POA: Insufficient documentation

## 2018-10-05 DIAGNOSIS — E1165 Type 2 diabetes mellitus with hyperglycemia: Secondary | ICD-10-CM | POA: Insufficient documentation

## 2018-10-05 DIAGNOSIS — R5383 Other fatigue: Secondary | ICD-10-CM

## 2018-10-05 DIAGNOSIS — Z7984 Long term (current) use of oral hypoglycemic drugs: Secondary | ICD-10-CM | POA: Insufficient documentation

## 2018-10-05 DIAGNOSIS — R739 Hyperglycemia, unspecified: Secondary | ICD-10-CM

## 2018-10-05 LAB — COMPREHENSIVE METABOLIC PANEL
ALT: 17 U/L (ref 0–44)
AST: 18 U/L (ref 15–41)
Albumin: 4.2 g/dL (ref 3.5–5.0)
Alkaline Phosphatase: 50 U/L (ref 38–126)
Anion gap: 8 (ref 5–15)
BUN: 16 mg/dL (ref 6–20)
CO2: 27 mmol/L (ref 22–32)
Calcium: 9.4 mg/dL (ref 8.9–10.3)
Chloride: 101 mmol/L (ref 98–111)
Creatinine, Ser: 0.78 mg/dL (ref 0.61–1.24)
GFR calc Af Amer: >60 mL/min (ref >60)
GFR calc non Af Amer: >60 mL/min (ref >60)
Glucose, Bld: 234 mg/dL — ABNORMAL HIGH (ref 70–99)
Potassium: 3.7 mmol/L (ref 3.5–5.1)
Sodium: 136 mmol/L (ref 135–145)
Total Bilirubin: 0.7 mg/dL (ref 0.3–1.2)
Total Protein: 7.3 g/dL (ref 6.5–8.1)

## 2018-10-05 LAB — CBC WITH DIFFERENTIAL/PLATELET
Abs Immature Granulocytes: 0.02 10*3/uL (ref 0.00–0.07)
Basophils Absolute: 0 10*3/uL (ref 0.0–0.1)
Basophils Relative: 0 %
Eosinophils Absolute: 0 10*3/uL (ref 0.0–0.5)
Eosinophils Relative: 1 %
HCT: 38.9 % — ABNORMAL LOW (ref 39.0–52.0)
Hemoglobin: 13.2 g/dL (ref 13.0–17.0)
Immature Granulocytes: 0 %
Lymphocytes Relative: 14 %
Lymphs Abs: 1.2 10*3/uL (ref 0.7–4.0)
MCH: 32.8 pg (ref 26.0–34.0)
MCHC: 33.9 g/dL (ref 30.0–36.0)
MCV: 96.8 fL (ref 80.0–100.0)
Monocytes Absolute: 0.5 10*3/uL (ref 0.1–1.0)
Monocytes Relative: 6 %
Neutro Abs: 6.7 10*3/uL (ref 1.7–7.7)
Neutrophils Relative %: 79 %
Platelets: 285 10*3/uL (ref 150–400)
RBC: 4.02 MIL/uL — ABNORMAL LOW (ref 4.22–5.81)
RDW: 12.9 % (ref 11.5–15.5)
WBC: 8.5 10*3/uL (ref 4.0–10.5)
nRBC: 0 % (ref 0.0–0.2)

## 2018-10-05 LAB — CBG MONITORING, ED: Glucose-Capillary: 184 mg/dL — ABNORMAL HIGH (ref 70–99)

## 2018-10-05 LAB — TROPONIN I: Troponin I: <0.03 ng/mL (ref <0.03)

## 2018-10-05 MED ORDER — SODIUM CHLORIDE 0.9 % IV BOLUS
1000.0000 mL | Freq: Once | INTRAVENOUS | Status: AC
  Administered 2018-10-05: 10:00:00 1000 mL via INTRAVENOUS

## 2018-10-05 MED ORDER — KETOROLAC TROMETHAMINE 30 MG/ML IJ SOLN
30.0000 mg | Freq: Once | INTRAMUSCULAR | Status: AC
  Administered 2018-10-05: 30 mg via INTRAVENOUS
  Filled 2018-10-05: qty 1

## 2018-10-05 MED ORDER — ONDANSETRON HCL 4 MG/2ML IJ SOLN
4.0000 mg | Freq: Once | INTRAMUSCULAR | Status: AC
  Administered 2018-10-05: 4 mg via INTRAVENOUS
  Filled 2018-10-05: qty 2

## 2018-10-05 NOTE — ED Provider Notes (Signed)
[facility] EMERGENCY DEPARTMENT Provider Note   CSN: [id number] Arrival date & time: 10/05/18  [id number]    History   Chief Complaint Chief Complaint  Patient presents with  . Hyperglycemia    HPI Carlos Derrell Lollingngram is a 43 y.o. male.     Patient is a 43 year old male with history of hypertension and type 2 diabetes.  He presents today for evaluation of elevated blood pressure and weakness.  This has been ongoing for the past 2 days.  His blood sugar this morning was 240 prior to taking his medication.  He denies any specific complaints such as abdominal pain, chest pain, or difficulty breathing.  He denies having had any fevers.  The history is provided by the patient.  Hyperglycemia  Blood sugar level PTA:  240 Severity:  Moderate Timing:  Constant Progression:  Partially resolved Diabetes status:  Controlled with oral medications Relieved by:  Nothing Ineffective treatments:  None tried   Past Medical History:  Diagnosis Date  . Diabetes mellitus 2002  . Hypertension     Patient Active Problem List   Diagnosis Date Noted  . Constipation 01/05/2018  . Vision abnormalities 01/05/2018  . Erectile dysfunction 05/10/2017  . 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 Diabetes mellitus type 2, insulin dependent (HCC) 01/02/2016  . Hypertension, essential 01/02/2016  . Does not have health insurance 01/02/2016  . Tobacco abuse 01/02/2016  . Onychomycosis due to dermatophyte 10/24/2012  . Pain in joint, ankle and foot 10/24/2012    Past Surgical History:  Procedure Laterality Date  . FEMUR FRACTURE SURGERY     right  . INCISION AND DRAINAGE PERIRECTAL ABSCESS N/A 01/02/2016   Procedure: IRRIGATION AND DEBRIDEMENT PERIRECTAL ABSCESS;  Surgeon: Axel FillerArmando Ramirez, MD;  Location: [facility] OR;  Service: General;  Laterality: N/A;        Home Medications    Prior to Admission medications   Medication Sig Start Date End Date Taking? Authorizing Provider   benazepril-hydrochlorthiazide (LOTENSIN HCT) 20-12.5 MG tablet Take 1 tablet by mouth daily. 06/25/18 10/23/18  Theotis BarrioLee, Joshua K, MD  Cinnamon 500 MG capsule Take 500 mg by mouth daily.    [provider]  glucose blood test strip The patient is insulin requiring, ICD 10 code E11.9. The patient tests 4 times per day. 09/16/16   Rivet, Iris Pertarly J, MD  glyBURIDE-metformin (GLUCOVANCE) 5-500 MG tablet Take 1 tablet by mouth 2 (two) times daily after a meal. 06/25/18   Theotis BarrioLee, Joshua K, MD  ondansetron (ZOFRAN) 4 MG tablet Take 1 tablet (4 mg total) by mouth every 8 (eight) hours as needed. 08/24/17   Tegeler, Canary Brimhristopher J, MD  sildenafil (VIAGRA) 50 MG tablet Take 1 tablet (50 mg total) by mouth daily as needed for erectile dysfunction. 09/04/17   Angelita InglesWinfrey, William B, MD    Family History No family history on file.  Social History Social History   Tobacco Use  . Smoking status: Current Some Day Smoker    Packs/day: 0.10    Years: 10.00    Pack years: 1.00    Types: Cigars    Last attempt to quit: 12/22/2015    Years since quitting: 2.7  . Smokeless tobacco: Never Used  . Tobacco comment: Recently quit  Substance Use Topics  . Alcohol use: Yes    Comment: OccAsional  . Drug use: Yes    Types: Marijuana    Comment: Occasionally.     Allergies   Penicillins   Review of Systems Review of Systems  All other systems reviewed and are negative.    Physical Exam Updated Vital Signs BP (!) 160/99 (BP Location: Right Arm)   Pulse 85   Temp 98.3 F (36.8 C) (Oral)   Resp 18   Ht 6\' 2"  (1.88 m)   Wt 90.3 kg   SpO2 99%   BMI 25.55 kg/m   Physical Exam Vitals signs and nursing note reviewed.  Constitutional:      General: He is not in acute distress.    Appearance: He is well-developed. He is not diaphoretic.  HENT:     Head: Normocephalic and atraumatic.     Mouth/Throat:     Mouth: Mucous membranes are moist.  Neck:     Musculoskeletal: Normal range of motion and neck  supple.  Cardiovascular:     Rate and Rhythm: Normal rate and regular rhythm.     Heart sounds: No murmur. No friction rub.  Pulmonary:     Effort: Pulmonary effort is normal. No respiratory distress.     Breath sounds: Normal breath sounds. No wheezing or rales.  Abdominal:     General: Bowel sounds are normal. There is no distension.     Palpations: Abdomen is soft.     Tenderness: There is no abdominal tenderness.  Musculoskeletal: Normal range of motion.  Skin:    General: Skin is warm and dry.  Neurological:     Mental Status: He is alert and oriented to person, place, and time.     Coordination: Coordination normal.      ED Treatments / Results  Labs (all labs ordered are listed, but only abnormal results are displayed) Labs Reviewed  CBG MONITORING, ED - Abnormal; Notable for the following components:      Result Value   Glucose-Capillary 184 (*)    All other components within normal limits    EKG None  Radiology No results found.  Procedures Procedures (including critical care time)  Medications Ordered in ED Medications  sodium chloride 0.9 % bolus 1,000 mL (has no administration in time range)  ondansetron (ZOFRAN) injection 4 mg (has no administration in time range)  ketorolac (TORADOL) 30 MG/ML injection 30 mg (has no administration in time range)     Initial Impression / Assessment and Plan / ED Course  I have reviewed the triage vital signs and the nursing notes.  Pertinent labs & imaging results that were available during my care of the patient were reviewed by me and considered in my medical decision making (see chart for details).  Patient presents with complaints of fatigue for the past 2 days.  His blood sugars have been running slightly high.  His sugar here is 240, however there is no evidence for DKA.  His cardiac work-up was unremarkable and blood counts are also essentially normal.  At this point, the patient appears stable and I believe is  appropriate for discharge.  He is to return as needed if he experiences additional problems.  I have also advised him to keep a record of his blood sugars and take this with him to his next doctor's appointment to discuss.  Final Clinical Impressions(s) / ED Diagnoses   Final diagnoses:  None    ED Discharge Orders    None       Geoffery Lyons, MD 10/05/18 1135

## 2018-10-05 NOTE — Discharge Instructions (Addendum)
Drink plenty of fluids and get plenty of rest.  Measure your blood sugar several times daily for the next few days and keep a record of this so that you can discuss the results with your primary doctor in the near future.  Return to the emergency department if you develop severe chest pain, difficulty breathing, or other new and concerning symptoms.

## 2018-10-05 NOTE — ED Triage Notes (Signed)
States he just doesn't feel well. His blood sugar has been running high. Also reports nausea.

## 2018-10-31 ENCOUNTER — Other Ambulatory Visit: Payer: Self-pay

## 2018-10-31 ENCOUNTER — Encounter (HOSPITAL_BASED_OUTPATIENT_CLINIC_OR_DEPARTMENT_OTHER): Payer: Self-pay

## 2018-10-31 ENCOUNTER — Emergency Department (HOSPITAL_BASED_OUTPATIENT_CLINIC_OR_DEPARTMENT_OTHER)
Admission: EM | Admit: 2018-10-31 | Discharge: 2018-10-31 | Discharge status: Against Medical Advice | Payer: Self-pay | Attending: Emergency Medicine | Admitting: Emergency Medicine

## 2018-10-31 DIAGNOSIS — R200 Anesthesia of skin: Secondary | ICD-10-CM | POA: Insufficient documentation

## 2018-10-31 DIAGNOSIS — Z7984 Long term (current) use of oral hypoglycemic drugs: Secondary | ICD-10-CM | POA: Insufficient documentation

## 2018-10-31 DIAGNOSIS — E119 Type 2 diabetes mellitus without complications: Secondary | ICD-10-CM | POA: Insufficient documentation

## 2018-10-31 DIAGNOSIS — I10 Essential (primary) hypertension: Secondary | ICD-10-CM | POA: Insufficient documentation

## 2018-10-31 DIAGNOSIS — J45909 Unspecified asthma, uncomplicated: Secondary | ICD-10-CM | POA: Insufficient documentation

## 2018-10-31 DIAGNOSIS — Z880 Allergy status to penicillin: Secondary | ICD-10-CM | POA: Insufficient documentation

## 2018-10-31 DIAGNOSIS — F17290 Nicotine dependence, other tobacco product, uncomplicated: Secondary | ICD-10-CM | POA: Insufficient documentation

## 2018-10-31 DIAGNOSIS — Z79899 Other long term (current) drug therapy: Secondary | ICD-10-CM | POA: Insufficient documentation

## 2018-10-31 LAB — URINALYSIS, ROUTINE W REFLEX MICROSCOPIC
Bilirubin Urine: NEGATIVE
Glucose, UA: 100 mg/dL — AB
Hgb urine dipstick: NEGATIVE
Ketones, ur: NEGATIVE mg/dL
Leukocytes,Ua: NEGATIVE
Nitrite: NEGATIVE
Protein, ur: NEGATIVE mg/dL
Specific Gravity, Urine: 1.02 (ref 1.005–1.030)
pH: 6 (ref 5.0–8.0)

## 2018-10-31 LAB — CBG MONITORING, ED: Glucose-Capillary: 211 mg/dL — ABNORMAL HIGH (ref 70–99)
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
## 2018-10-31 NOTE — ED Notes (Signed)
Pt walked out before provider able to discharge.  Pt states waiting too long.   Provider aware.

## 2018-10-31 NOTE — ED Notes (Signed)
POC CBG IS 211. RN (MAGGIE) NOTIFIED.
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
## 2018-11-13 ENCOUNTER — Other Ambulatory Visit: Payer: Self-pay | Admitting: Internal Medicine

## 2018-11-13 DIAGNOSIS — I10 Essential (primary) hypertension: Secondary | ICD-10-CM

## 2018-11-13 NOTE — Telephone Encounter (Signed)
Refilled

## 2018-11-14 ENCOUNTER — Other Ambulatory Visit: Payer: Self-pay

## 2018-11-14 ENCOUNTER — Ambulatory Visit (INDEPENDENT_AMBULATORY_CARE_PROVIDER_SITE_OTHER): Payer: Self-pay | Admitting: Internal Medicine

## 2018-11-14 VITALS — BP 132/83 | HR 91 | Temp 98.4°F | Ht 74.0 in | Wt 185.9 lb

## 2018-11-14 DIAGNOSIS — M79661 Pain in right lower leg: Secondary | ICD-10-CM | POA: Insufficient documentation

## 2018-11-14 DIAGNOSIS — G5711 Meralgia paresthetica, right lower limb: Secondary | ICD-10-CM

## 2018-11-14 DIAGNOSIS — I10 Essential (primary) hypertension: Secondary | ICD-10-CM

## 2018-11-14 DIAGNOSIS — Z79899 Other long term (current) drug therapy: Secondary | ICD-10-CM

## 2018-11-14 HISTORY — DX: Meralgia paresthetica, right lower limb: G57.11

## 2018-11-14 MED ORDER — BENAZEPRIL-HYDROCHLOROTHIAZIDE 20-12.5 MG PO TABS: 1.0000 | ORAL_TABLET | Freq: Every day | ORAL | 3 refills | Status: DC

## 2018-11-14 NOTE — Patient Instructions (Addendum)
Thank you for allowing [place] to care for you  You blood pressure medicine was refilled  For your leg numbness - This may be consistent with lateral femoral cutaneous nerve entrapment - It is helpful to avoid tight belts and other pressure along your waistline  For your calf pain - Take ibuprofen for 1 wk - Rest - Apply heat several times a day and stretch the muscle  Please follow up with your Primary doctor later this month

## 2018-11-14 NOTE — Progress Notes (Signed)
   CC: HTN, Right leg pain, Right leg numbness   HPI:  [name] is a 43 y.o. M with PMHx listed below presenting for HTN, Right leg pain, Right leg numbness. Please see the A&P for the status of the patient's chronic medical problems.   Past Medical History:  Diagnosis Date  . Diabetes mellitus 2002  . Hypertension    Review of Systems:  Performed and all others negative.  Physical Exam:  Vitals:   11/14/18 1532  BP: 132/83  Pulse: 91  Temp: 98.4 F (36.9 C)  TempSrc: Oral  SpO2: 100%  Weight: 185 lb 14.4 oz (84.3 kg)  Height: 6\' 2"  (1.88 m)   Physical Exam Constitutional:      General: He is not in acute distress.    Appearance: Normal appearance.  Cardiovascular:     Rate and Rhythm: Normal rate and regular rhythm.     Pulses: Normal pulses.     Heart sounds: Normal heart sounds.  Pulmonary:     Effort: Pulmonary effort is normal. No respiratory distress.     Breath sounds: Normal breath sounds.  Abdominal:     General: Bowel sounds are normal. There is no distension.     Palpations: Abdomen is soft.     Tenderness: There is no abdominal tenderness.  Musculoskeletal:        General: No swelling or deformity.     Comments: Mild tenderness in lateral R calf  Skin:    General: Skin is warm and dry.  Neurological:     Mental Status: Mental status is at baseline.     Comments: Numbness and decreases sensation to sharp stimuli at right thigh, pressure sensation intact    Assessment & Plan:   See Encounters Tab for problem based charting.  Patient discussed with Dr. Rogelia Boga

## 2018-11-14 NOTE — Assessment & Plan Note (Signed)
Patient report tenderness of his right lateral calf for 2 days. Pain is worse when flexing the muscle. No superficial abnormality noted. ROM and strength intact. Suspect MSK pain vs strain. - Recommend IBU 3-4 per day - Rest - apply heat and alternate with ice - avoid painful activities

## 2018-11-14 NOTE — Assessment & Plan Note (Signed)
Patient requesting refills. BP remains controlled today at 114/66 on current meds. Refill provided. - Refilll Benazepril-HCTZ 30-18mg  daily (1.5 tabs)

## 2018-11-14 NOTE — Assessment & Plan Note (Signed)
Patient reports 1 month of R anterolateral thigh numbness that seems to have expanded in distrubution to near his knee. No change in strength. Loss of sensation to sharp stimuli, but not pressure in this area. Does report Hip surgery on that side about 20 years ago but no symptoms until now. On further questioning he states he has been wearing a tight work belt that digs into his waist at times. He has also noticed that his symptoms have seemed better since he has been wearing sweat-pants the past few days.   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 - Avoid tight fitting cloths and belts that cause compression on the waist line. Mobile phones in front pocket can also cause symptoms if the apply pressure in the area.

## 2018-11-16 NOTE — Progress Notes (Signed)
[facility] Clinic Attending  Case discussed with Dr. Melvin  at the time of the visit.  We reviewed the resident's history and exam and pertinent patient test results.  I agree with the assessment, diagnosis, and plan of care documented in the resident's note.  

## 2018-12-06 ENCOUNTER — Other Ambulatory Visit: Payer: Self-pay | Admitting: Internal Medicine

## 2018-12-06 DIAGNOSIS — E119 Type 2 diabetes mellitus without complications: Secondary | ICD-10-CM

## 2018-12-06 DIAGNOSIS — Z794 Long term (current) use of insulin: Secondary | ICD-10-CM

## 2018-12-06 NOTE — Telephone Encounter (Signed)
Next appt scheduled  8/3 with PCP.

## 2018-12-07 NOTE — Telephone Encounter (Signed)
refilled 

## 2018-12-24 ENCOUNTER — Ambulatory Visit: Payer: Self-pay | Admitting: Podiatry

## 2018-12-24 NOTE — Addendum Note (Signed)
Addended by: Hulan Fray on: 12/24/2018 05:45 PM   Modules accepted: Orders

## 2019-01-13 NOTE — Progress Notes (Deleted)
CC: T2DM follow up  HPI:  [name] is a 43 y.o. male with PMH below.  Today we will address T2DM  He is on glucovance 5-500 BID.  Also supposed to be on levemir 30u daily but self discontinued earlier this year. Last A1C 7.8 in January.    HLD:  Lab Results  Component Value Date   CHOL 207 (H) 06/25/2018   HDL 53 06/25/2018   LDLCALC 141 (H) 06/25/2018   TRIG 67 06/25/2018   CHOLHDL 3.9 06/25/2018   Will revisit with pt whether he would be amenable to starting a statin.      Current Outpatient Medications:  .  benazepril-hydrochlorthiazide (LOTENSIN HCT) 20-12.5 MG tablet, Take 1 tablet by mouth daily., Disp: 90 tablet, Rfl: 3 .  Cinnamon 500 MG capsule, Take 500 mg by mouth daily., Disp: , Rfl:  .  glucose blood test strip, The patient is insulin requiring, ICD 10 code E11.9. The patient tests 4 times per day., Disp: 100 each, Rfl: 12 .  glyBURIDE-metformin (GLUCOVANCE) 5-500 MG tablet, TAKE 1 TABLET BY MOUTH 2 (TWO) TIMES DAILY AFTER A MEAL., Disp: 180 tablet, Rfl: 0 .  ondansetron (ZOFRAN) 4 MG tablet, Take 1 tablet (4 mg total) by mouth every 8 (eight) hours as needed., Disp: 12 tablet, Rfl: 0 .  sildenafil (VIAGRA) 50 MG tablet, Take 1 tablet (50 mg total) by mouth daily as needed for erectile dysfunction., Disp: 10 tablet, Rfl: 1  Please see A&P for status of the patient's chronic medical conditions  Past Medical History:  Diagnosis Date  . Diabetes mellitus 2002  . Hypertension    Review of Systems:  ***  Physical Exam:  There were no vitals filed for this visit. ***  Social History   Socioeconomic History  . Marital status: Married    Spouse name: Not on file  . Number of children: Not on file  . Years of education: Not on file  . Highest education level: Not on file  Occupational History  . Not on file  Social Needs  . Financial resource strain: Not on file  . Food insecurity    Worry: Not on file    Inability: Not on file  .  Transportation needs    Medical: Not on file    Non-medical: Not on file  Tobacco Use  . Smoking status: Current Some Day Smoker    Packs/day: 0.10    Years: 10.00    Pack years: 1.00    Types: Cigars    Last attempt to quit: 12/22/2015    Years since quitting: 3.0  . Smokeless tobacco: Never Used  . Tobacco comment: Recently quit  Substance and Sexual Activity  . Alcohol use: Yes    Comment: OccAsional  . Drug use: Yes    Types: Marijuana    Comment: Occasionally.  . Sexual activity: Not on file  Lifestyle  . Physical activity    Days per week: Not on file    Minutes per session: Not on file  . Stress: Not on file  Relationships  . Social Musicianconnections    Talks on phone: Not on file    Gets together: Not on file    Attends religious service: Not on file    Active member of club or organization: Not on file    Attends meetings of clubs or organizations: Not on file    Relationship status: Not on file  . Intimate partner violence    Fear of current  or ex partner: Not on file    Emotionally abused: Not on file    Physically abused: Not on file    Forced sexual activity: Not on file  Other Topics Concern  . Not on file  Social History Narrative  . Not on file   *** No family history on file.  Assessment & Plan:   See Encounters Tab for problem based charting.  Patient {GC/GE:[id number]::"discussed with","seen with"} Dr. {NAMES:[id number]::"Butcher","Granfortuna","E. Hoffman","Klima","Mullen","Narendra","Raines","Vincent"}

## 2019-01-14 ENCOUNTER — Encounter: Payer: Self-pay | Admitting: Internal Medicine

## 2019-02-13 ENCOUNTER — Other Ambulatory Visit: Payer: Self-pay | Admitting: Internal Medicine

## 2019-02-13 DIAGNOSIS — I10 Essential (primary) hypertension: Secondary | ICD-10-CM

## 2019-03-04 ENCOUNTER — Ambulatory Visit (INDEPENDENT_AMBULATORY_CARE_PROVIDER_SITE_OTHER): Payer: Self-pay | Admitting: Internal Medicine

## 2019-03-04 ENCOUNTER — Other Ambulatory Visit: Payer: Self-pay

## 2019-03-04 ENCOUNTER — Encounter: Payer: Self-pay | Admitting: Internal Medicine

## 2019-03-04 VITALS — BP 136/79 | HR 85 | Temp 98.5°F | Ht 74.4 in | Wt 180.8 lb

## 2019-03-04 DIAGNOSIS — E785 Hyperlipidemia, unspecified: Secondary | ICD-10-CM

## 2019-03-04 DIAGNOSIS — Z79899 Other long term (current) drug therapy: Secondary | ICD-10-CM

## 2019-03-04 DIAGNOSIS — F17290 Nicotine dependence, other tobacco product, uncomplicated: Secondary | ICD-10-CM

## 2019-03-04 DIAGNOSIS — E119 Type 2 diabetes mellitus without complications: Secondary | ICD-10-CM

## 2019-03-04 DIAGNOSIS — Z7984 Long term (current) use of oral hypoglycemic drugs: Secondary | ICD-10-CM

## 2019-03-04 DIAGNOSIS — Z794 Long term (current) use of insulin: Secondary | ICD-10-CM

## 2019-03-04 HISTORY — DX: Hyperlipidemia, unspecified: E78.5

## 2019-03-04 LAB — POCT GLYCOSYLATED HEMOGLOBIN (HGB A1C): Hemoglobin A1C: 8.5 % — AB (ref 4.0–5.6)

## 2019-03-04 LAB — GLUCOSE, CAPILLARY: Glucose-Capillary: 135 mg/dL — ABNORMAL HIGH (ref 70–99)

## 2019-03-04 MED ORDER — CANAGLIFLOZIN 100 MG PO TABS: 100.0000 mg | ORAL_TABLET | Freq: Every day | ORAL | 3 refills | Status: DC

## 2019-03-04 MED ORDER — ROSUVASTATIN CALCIUM 20 MG PO TABS: 20.0000 mg | ORAL_TABLET | Freq: Every day | ORAL | 3 refills | Status: DC

## 2019-03-04 MED FILL — ROSUVASTATIN CALCIUM 20 MG: 20 | 30 days supply | Qty: 30 | Fill #0

## 2019-03-04 MED FILL — INVOKANA 100 MG TABLET: 100 | 30 days supply | Qty: 30 | Fill #0

## 2019-03-04 NOTE — Assessment & Plan Note (Signed)
>>  ASSESSMENT AND PLAN FOR DIABETES MELLITUS TYPE 2, INSULIN  DEPENDENT ([facility]) WRITTEN ON 03/04/2019  7:35 PM BY Cherylene Corrente, MD    Lab Results  Component Value Date   HGBA1C 8.5 (A) 03/04/2019   He is on glucovance  5-500 BID(cannot tolerate full dose due to GI side effects). Used to be on levemir  but self discontinued and will not go back. Has lost some weight working and being active working on portion control.  Avoiding sodas and breads.  He is willing to take tablet medications.  -reviewed risks and benefits of SGLT-2i therapy -started pt on invokana  100mg

## 2019-03-04 NOTE — Assessment & Plan Note (Signed)
  Lab Results  Component Value Date   HGBA1C 8.5 (A) 03/04/2019   He is on glucovance 5-500 BID(cannot tolerate full dose due to GI side effects). Used to be on levemir but self discontinued and will not go back. Has lost some weight working and being active working on portion control.  Avoiding sodas and breads.  He is willing to take tablet medications.  -reviewed risks and benefits of SGLT-2i therapy -started pt on invokana 100mg

## 2019-03-04 NOTE — Assessment & Plan Note (Signed)
21.3%ASCVD risk and diabetic.  Discussed starting statin therapy today.  -started crestor 20mg 

## 2019-03-04 NOTE — Progress Notes (Signed)
   CC: HLD, T2DM  HPI:  [name] is a 43 y.o. male with PMH below.  Today we will address HLD, T2DM  Please see A&P for status of the patient's chronic medical conditions  Past Medical History:  Diagnosis Date  . Diabetes mellitus 2002  . Hypertension    Review of Systems:  ROS: Pulmonary: pt denies increased work of breathing, shortness of breath,  Cardiac: pt denies palpitations, chest pain,  Abdominal: pt denies abdominal pain, nausea, vomiting, or diarrhea   Physical Exam:  Vitals:   03/04/19 1425  BP: (!) 151/78  Pulse: 88  Temp: 98.5 F (36.9 C)  TempSrc: Oral  SpO2: 100%  Weight: 180 lb 12.8 oz (82 kg)  Height: 6' 2.4" (1.89 m)   Cardiac: JVD flat, normal rate and rhythm, clear s1 and s2, no murmurs, rubs or gallops, no LE edema Pulmonary: CTAB, not in distress Abdominal: non distended abdomen, soft and nontender Psych: Alert, conversant, in good spirits   Social History   Socioeconomic History  . Marital status: Married    Spouse name: Not on file  . Number of children: Not on file  . Years of education: Not on file  . Highest education level: Not on file  Occupational History  . Not on file  Social Needs  . Financial resource strain: Not on file  . Food insecurity    Worry: Not on file    Inability: Not on file  . Transportation needs    Medical: Not on file    Non-medical: Not on file  Tobacco Use  . Smoking status: Current Some Day Smoker    Packs/day: 0.10    Years: 10.00    Pack years: 1.00    Types: Cigars    Last attempt to quit: 12/22/2015    Years since quitting: 3.2  . Smokeless tobacco: Never Used  . Tobacco comment: Recently quit  Substance and Sexual Activity  . Alcohol use: Yes    Comment: OccAsional  . Drug use: Yes    Types: Marijuana    Comment: Occasionally.  . Sexual activity: Not on file  Lifestyle  . Physical activity    Days per week: Not on file    Minutes per session: Not on file  . Stress: Not on  file  Relationships  . Social Herbalist on phone: Not on file    Gets together: Not on file    Attends religious service: Not on file    Active member of club or organization: Not on file    Attends meetings of clubs or organizations: Not on file    Relationship status: Not on file  . Intimate partner violence    Fear of current or ex partner: Not on file    Emotionally abused: Not on file    Physically abused: Not on file    Forced sexual activity: Not on file  Other Topics Concern  . Not on file  Social History Narrative  . Not on file   No family history on file.  Assessment & Plan:   See Encounters Tab for problem based charting.  Patient discussed with Dr. Philipp Ovens

## 2019-03-04 NOTE — Patient Instructions (Addendum)
Mr shear we are starting two new medications for your diabetes and cholesterol.  Information about them is listed below.  Please start taking them today and we will check your cholesterol and hemoglobin A1C at your next visit.   Canagliflozin; Metformin oral tablets What is this medicine? CANAGLIFLOZIN; METFORMIN (KAN a gli FLOE zin; met FOR min) is a combination of 2 medicines used to treat type 2 diabetes. This medicine lowers blood sugar. Treatment is combined with a balanced diet and exercise. This medicine may be used for other purposes; ask your health care provider or pharmacist if you have questions. COMMON BRAND NAME(S): Invokamet What should I tell my health care provider before I take this medicine? They need to know if you have any of these conditions:  anemia  artery disease  dehydration  diabetic ketoacidosis  diet low in salt  eating less due to illness, surgery, dieting, or any other reason  foot sores  having surgery  heart disease  high cholesterol  high levels of potassium in the blood  history of amputation  history of pancreatitis or pancreas problems  history of yeast infection of the penis or vagina  if you often drink alcohol  infections in the bladder, kidneys, or urinary tract  kidney disease  liver disease  low blood pressure  nerve damage  on hemodialysis  polycystic ovary syndrome  problems urinating  serious infection or injury  type 1 diabetes  uncircumcised male  vomiting  an unusual or allergic reaction to canagliflozin, metformin, other medicines, foods, dyes, or preservatives  pregnant or trying to get pregnant  breast-feeding How should I use this medicine? Take this medicine by mouth with a glass of water. Take this medicine with food. Follow the directions on the prescription label. Take your doses at regular intervals. Do not take your medicine more often than directed. Do not stop taking except on your  doctor's advice. A special MedGuide will be given to you by the pharmacist with each prescription and refill. Be sure to read this information carefully each time. Talk to your pediatrician regarding the use of this medicine in children. Special care may be needed. Overdosage: If you think you have taken too much of this medicine contact a poison control center or emergency room at once. NOTE: This medicine is only for you. Do not share this medicine with others. What if I miss a dose? If you miss a dose, take it as soon as you can. If it is almost time for your next dose, take only that dose. Do not take double or extra doses. What may interact with this medicine? Do not take this medicine with any of the following medications:  certain contrast medicines given before X-rays, CT scans, MRI, or other procedures  dofetilide This medicine may also interact with the following medications:  acetazolamide  alcohol  certain antivirals for HIV or hepatitis  certain medicines for blood pressure, heart disease, irregular heart beat  cimetidine  dichlorphenamide  digoxin  diuretics  male hormones, like estrogens or progestins and birth control pills  glycopyrrolate  isoniazid  lamotrigine  memantine  methazolamide  metoclopramide  midodrine  niacin  phenothiazines like chlorpromazine, mesoridazine, prochlorperazine, thioridazine  phenytoin  ranolazine  rifampin  ritonavir  steroid medicines like prednisone or cortisone  stimulant medicines for attention disorders, weight loss, or to stay awake  thyroid medicines  topiramate  trospium  vandetanib  zonisamide This list may not describe all possible interactions. Give your health care  provider a list of all the medicines, herbs, non-prescription drugs, or dietary supplements you use. Also tell them if you smoke, drink alcohol, or use illegal drugs. Some items may interact with your medicine. What should I  watch for while using this medicine? Visit your doctor or health care professional for regular checks on your progress. This medicine can cause a serious condition in which there is too much acid in the blood. If you develop nausea, vomiting, stomach pain, unusual tiredness, or breathing problems, stop taking this medicine and call your doctor right away. If possible, use a ketone dipstick to check for ketones in your urine. A test called the HbA1C (A1C) will be monitored. This is a simple blood test. It measures your blood sugar control over the last 2 to 3 months. You will receive this test every 3 to 6 months. Learn how to check your blood sugar. Learn the symptoms of low and high blood sugar and how to manage them. Always carry a quick-source of sugar with you in case you have symptoms of low blood sugar. Examples include hard sugar candy or glucose tablets. Make sure others know that you can choke if you eat or drink when you develop serious symptoms of low blood sugar, such as seizures or unconsciousness. They must get medical help at once. Tell your doctor or health care professional if you have high blood sugar. You might need to change the dose of your medicine. If you are sick or exercising more than usual, you might need to change the dose of your medicine. Do not skip meals. Ask your doctor or health care professional if you should avoid alcohol. Many nonprescription cough and cold products contain sugar or alcohol. These can affect blood sugar. This medicine may cause ovulation in premenopausal women who do not have regular monthly periods. This may increase your chances of becoming pregnant. You should not take this medicine if you become pregnant or think you may be pregnant. Talk with your doctor or health care professional about your birth control options while taking this medicine. Contact your doctor or health care professional right away if think you are pregnant. If you are going to  need surgery, a MRI, CT scan, or other procedure, tell your doctor that you are taking this medicine. You may need to stop taking this medicine before the procedure. Wear a medical ID bracelet or chain, and carry a card that describes your disease and details of your medicine and dosage times. This medicine may cause a decrease in folic acid and vitamin B12. You should make sure that you get enough vitamins while you are taking this medicine. Discuss the foods you eat and the vitamins you take with your health care professional. What side effects may I notice from receiving this medicine? Side effects that you should report to your doctor or health care professional as soon as possible:  allergic reactions like skin rash, itching or hives, swelling of the face, lips, or tongue  breathing problems  chest pain  dizziness  feeling faint or lightheaded, falls  muscle aches or pains  muscle weakness  new pain or tenderness, change in skin color, sores or ulcers, or infection in legs or feet  penile discharge, itching, or pain in men  signs and symptoms of a genital infection, such as fever; tenderness, redness, or swelling in the genitals or area from the genitals to the back of the rectum  signs and symptoms of low blood sugar such as  feeling anxious, confusion, dizziness, increased hunger, unusually weak or tired, sweating, shakiness, cold, irritable, headache, blurred vision, fast heartbeat, loss of consciousness  signs and symptoms of a urinary tract infection, such as fever, chills, a burning feeling when urinating, blood in the urine, back pain  trouble passing urine or change in the amount of urine, including an urgent need to urinate more often, in larger amounts, or at night  slow or irregular heartbeat  tingling or numbness in the hands, legs, or feet  unusual stomach pain or discomfort  unusually tired or weak  vaginal discharge, itching, or odor in  women  vomiting Side effects that usually do not require medical attention (report to your doctor or health care professional if they continue or are bothersome):  diarrhea  headache  heartburn  metallic taste in mouth  mild increase in urination  nausea  stomach gas, upset  thirsty This list may not describe all possible side effects. Call your doctor for medical advice about side effects. You may report side effects to FDA at 1-800-FDA-1088. Where should I keep my medicine? Keep out of the reach of children. Store at room temperature between 15 and 30 degrees C (59 and 86 degrees F). Throw away any unused medicine after the expiration date. NOTE: This sheet is a summary. It may not cover all possible information. If you have questions about this medicine, talk to your doctor, pharmacist, or health care provider.  2020 Elsevier/Gold Standard (2017-07-07 12:58:21) Rosuvastatin Tablets What is this medicine? ROSUVASTATIN (roe SOO va sta tin) is known as a HMG-CoA reductase inhibitor or 'statin'. It lowers cholesterol and triglycerides in the blood. This drug may also reduce the risk of heart attack, stroke, or other health problems in patients with risk factors for heart disease. Diet and lifestyle changes are often used with this drug. This medicine may be used for other purposes; ask your health care provider or pharmacist if you have questions. COMMON BRAND NAME(S): Crestor What should I tell my health care provider before I take this medicine? They need to know if you have any of these conditions:  diabetes  if you often drink alcohol  history of stroke  kidney disease  liver disease  muscle aches or weakness  thyroid disease  an unusual or allergic reaction to rosuvastatin, other medicines, foods, dyes, or preservatives  pregnant or trying to get pregnant  breast-feeding How should I use this medicine? Take this medicine by mouth with a glass of water.  Follow the directions on the prescription label. Do not cut, crush or chew this medicine. You can take this medicine with or without food. Take your doses at regular intervals. Do not take your medicine more often than directed. Talk to your pediatrician regarding the use of this medicine in children. While this drug may be prescribed for children as young as 71 years old for selected conditions, precautions do apply. Overdosage: If you think you have taken too much of this medicine contact a poison control center or emergency room at once. NOTE: This medicine is only for you. Do not share this medicine with others. What if I miss a dose? If you miss a dose, take it as soon as you can. If your next dose is to be taken in less than 12 hours, then do not take the missed dose. Take the next dose at your regular time. Do not take double or extra doses. What may interact with this medicine? Do not take this medicine  with any of the following medications:  herbal medicines like red yeast rice This medicine may also interact with the following medications:  alcohol  antacids containing aluminum hydroxide or magnesium hydroxide  cyclosporine  other medicines for high cholesterol  some medicines for HIV infection  warfarin This list may not describe all possible interactions. Give your health care provider a list of all the medicines, herbs, non-prescription drugs, or dietary supplements you use. Also tell them if you smoke, drink alcohol, or use illegal drugs. Some items may interact with your medicine. What should I watch for while using this medicine? Visit your doctor or health care professional for regular check-ups. You may need regular tests to make sure your liver is working properly. Your health care professional may tell you to stop taking this medicine if you develop muscle problems. If your muscle problems do not go away after stopping this medicine, contact your health care  professional. Do not become pregnant while taking this medicine. Women should inform their health care professional if they wish to become pregnant or think they might be pregnant. There is a potential for serious side effects to an unborn child. Talk to your health care professional or pharmacist for more information. Do not breast-feed an infant while taking this medicine. This medicine may increase blood sugar. Ask your healthcare provider if changes in diet or medicines are needed if you have diabetes. If you are going to need surgery or other procedure, tell your doctor that you are using this medicine. This drug is only part of a total heart-health program. Your doctor or a dietician can suggest a low-cholesterol and low-fat diet to help. Avoid alcohol and smoking, and keep a proper exercise schedule. This medicine may cause a decrease in Co-Enzyme Q-10. You should make sure that you get enough Co-Enzyme Q-10 while you are taking this medicine. Discuss the foods you eat and the vitamins you take with your health care professional. What side effects may I notice from receiving this medicine? Side effects that you should report to your doctor or health care professional as soon as possible:  allergic reactions like skin rash, itching or hives, swelling of the face, lips, or tongue  confusion  joint pain  loss of memory  redness, blistering, peeling or loosening of the skin, including inside the mouth  signs and symptoms of high blood sugar such as being more thirsty or hungry or having to urinate more than normal. You may also feel very tired or have blurry vision.  signs and symptoms of muscle injury like dark urine; trouble passing urine or change in the amount of urine; unusually weak or tired; muscle pain or side or back pain  yellowing of the eyes or skin Side effects that usually do not require medical attention (report to your doctor or health care professional if they continue or  are bothersome):  constipation  diarrhea  dizziness  gas  headache  nausea  stomach pain  trouble sleeping  upset stomach This list may not describe all possible side effects. Call your doctor for medical advice about side effects. You may report side effects to FDA at 1-800-FDA-1088. Where should I keep my medicine? Keep out of the reach of children. Store at room temperature between 20 and 25 degrees C (68 and 77 degrees F). Keep container tightly closed (protect from moisture). Throw away any unused medicine after the expiration date. NOTE: This sheet is a summary. It may not cover all possible information. If  you have questions about this medicine, talk to your doctor, pharmacist, or health care provider.  2020 Elsevier/Gold Standard (2018-03-22 08:25:08)

## 2019-03-06 NOTE — Progress Notes (Signed)
Internal Medicine Clinic Attending  Case discussed with Dr. Winfrey  at the time of the visit.  We reviewed the resident's history and exam and pertinent patient test results.  I agree with the assessment, diagnosis, and plan of care documented in the resident's note.  

## 2019-04-25 MED FILL — ROSUVASTATIN CALCIUM 20 MG: 20 | 30 days supply | Qty: 30 | Fill #1

## 2019-05-07 ENCOUNTER — Telehealth: Payer: Self-pay | Admitting: Internal Medicine

## 2019-05-07 MED FILL — INVOKANA 100 MG TABLET: 100 | 30 days supply | Qty: 30 | Fill #1

## 2019-05-07 NOTE — Telephone Encounter (Signed)
Pt calling to report his Blood Sugars after eating has been in the 300's and he has been feeling pretty sluggish.  Patient requesting fro a nurse to call back.

## 2019-05-07 NOTE — Telephone Encounter (Signed)
I returned Carlos [name]'s call: he said the last couple days.his blood sugars have been higher and especially after he eats. He takes Glyburide/Metformin 5-500 mg 2x/day. He never picked up or started taking canagliflozin because at the time he didn't;t think he needed it. " Blood sugar was normal".  Lab Results  Component Value Date   HGBA1C 8.5 (A) 03/04/2019   HGBA1C 7.8 (A) 06/25/2018   HGBA1C 8.5 (A) 01/05/2018   HGBA1C 12.2 05/10/2017   HGBA1C 9.3 09/16/2016    He agreed to pick up the canagliflozin and start it and call us for an appointment next week if needed. I explained that he needs more water with higher blood sugars and also with the canagliflozin. He agreed to drink more water. He was educated how to contact the after hours doctor on call.  Plan to call next week to urge him to schedule a follow up in December.

## 2019-05-28 MED FILL — ROSUVASTATIN CALCIUM 20 MG: 20 | 30 days supply | Qty: 30 | Fill #2

## 2019-06-01 ENCOUNTER — Other Ambulatory Visit: Payer: Self-pay | Admitting: Internal Medicine

## 2019-06-01 DIAGNOSIS — Z794 Long term (current) use of insulin: Secondary | ICD-10-CM

## 2019-06-01 DIAGNOSIS — E119 Type 2 diabetes mellitus without complications: Secondary | ICD-10-CM

## 2019-07-01 MED FILL — ROSUVASTATIN CALCIUM 20 MG: 20 | 30 days supply | Qty: 30 | Fill #3

## 2019-07-22 MED FILL — INVOKANA 100 MG TABLET: 100 | 30 days supply | Qty: 30 | Fill #2

## 2019-08-05 ENCOUNTER — Emergency Department (HOSPITAL_BASED_OUTPATIENT_CLINIC_OR_DEPARTMENT_OTHER): Payer: Self-pay

## 2019-08-05 ENCOUNTER — Encounter (HOSPITAL_BASED_OUTPATIENT_CLINIC_OR_DEPARTMENT_OTHER): Payer: Self-pay

## 2019-08-05 ENCOUNTER — Emergency Department (HOSPITAL_BASED_OUTPATIENT_CLINIC_OR_DEPARTMENT_OTHER)
Admission: EM | Admit: 2019-08-05 | Discharge: 2019-08-06 | Disposition: A | Discharge status: Home / Self Care | Payer: Self-pay | Attending: Emergency Medicine | Admitting: Emergency Medicine

## 2019-08-05 ENCOUNTER — Other Ambulatory Visit: Payer: Self-pay

## 2019-08-05 DIAGNOSIS — M545 Low back pain, unspecified: Secondary | ICD-10-CM

## 2019-08-05 DIAGNOSIS — E119 Type 2 diabetes mellitus without complications: Secondary | ICD-10-CM | POA: Insufficient documentation

## 2019-08-05 DIAGNOSIS — Z87891 Personal history of nicotine dependence: Secondary | ICD-10-CM | POA: Insufficient documentation

## 2019-08-05 DIAGNOSIS — I10 Essential (primary) hypertension: Secondary | ICD-10-CM | POA: Insufficient documentation

## 2019-08-05 DIAGNOSIS — E785 Hyperlipidemia, unspecified: Secondary | ICD-10-CM | POA: Insufficient documentation

## 2019-08-05 MED ORDER — CYCLOBENZAPRINE HCL 5 MG PO TABS
5.0000 mg | ORAL_TABLET | Freq: Once | ORAL | Status: AC
  Administered 2019-08-05: 5 mg via ORAL
  Filled 2019-08-05: qty 1

## 2019-08-05 MED ORDER — KETOROLAC TROMETHAMINE 60 MG/2ML IM SOLN
60.0000 mg | Freq: Once | INTRAMUSCULAR | Status: AC
  Administered 2019-08-05: 60 mg via INTRAMUSCULAR
  Filled 2019-08-05: qty 2

## 2019-08-05 NOTE — ED Notes (Signed)
Pt to XR
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
## 2019-08-06 MED ORDER — CYCLOBENZAPRINE HCL 10 MG PO TABS: 10.0000 mg | ORAL_TABLET | Freq: Two times a day (BID) | ORAL | 0 refills | Status: DC | PRN

## 2019-08-20 ENCOUNTER — Other Ambulatory Visit: Payer: Self-pay | Admitting: Internal Medicine

## 2019-08-22 ENCOUNTER — Other Ambulatory Visit: Payer: Self-pay | Admitting: Internal Medicine

## 2019-08-22 DIAGNOSIS — E119 Type 2 diabetes mellitus without complications: Secondary | ICD-10-CM

## 2019-08-22 DIAGNOSIS — E785 Hyperlipidemia, unspecified: Secondary | ICD-10-CM

## 2019-08-22 MED ORDER — ROSUVASTATIN CALCIUM 20 MG PO TABS: 20.0000 mg | ORAL_TABLET | Freq: Every day | ORAL | 3 refills | Status: DC

## 2019-08-22 MED ORDER — CANAGLIFLOZIN 100 MG PO TABS: 100.0000 mg | ORAL_TABLET | Freq: Every day | ORAL | 3 refills | Status: DC

## 2019-08-22 MED FILL — ROSUVASTATIN CALCIUM 20 MG: 20 | 30 days supply | Qty: 30 | Fill #0

## 2019-08-22 MED FILL — INVOKANA 100 MG TABLET: 100 | 30 days supply | Qty: 30 | Fill #0

## 2019-08-22 NOTE — Telephone Encounter (Signed)
Need refill on choles. ;pt contact [phone]   [id number] - [place], [place] - [address]

## 2019-08-22 NOTE — Telephone Encounter (Signed)
TC to patient:  Pt requesting refill on Invokana and Crestor.   RX's were last sent to [facility] program, pt states he has no insurance and is requesting RX's be sent to [facility] again.  Pt past due for f/u with PCP, will forward to front desk to assist pt in scheduling. Will also forward to financial counselor to assist pt in scheduling appt with her. Thank you, SChaplin, RN,BSN

## 2019-08-26 ENCOUNTER — Telehealth: Payer: Self-pay | Admitting: Internal Medicine

## 2019-08-26 NOTE — Telephone Encounter (Signed)
Spoke with the patient.  He has been sch with the [facility]  N. Anne Hahn and his PCP Dr. Frances Furbish  on 09/23/2019.

## 2019-09-04 ENCOUNTER — Other Ambulatory Visit: Payer: Self-pay | Admitting: Internal Medicine

## 2019-09-04 DIAGNOSIS — E119 Type 2 diabetes mellitus without complications: Secondary | ICD-10-CM

## 2019-09-04 NOTE — Telephone Encounter (Signed)
Next appt scheduled 4/12 with PCP. 

## 2019-09-09 NOTE — Telephone Encounter (Signed)
Open in Error.

## 2019-09-23 ENCOUNTER — Ambulatory Visit: Payer: Self-pay

## 2019-09-23 ENCOUNTER — Encounter: Payer: Self-pay | Admitting: Internal Medicine

## 2019-09-23 ENCOUNTER — Telehealth: Payer: Self-pay | Admitting: *Deleted

## 2019-09-23 NOTE — Telephone Encounter (Signed)
Pt did not come to his appt today.  Pt was called to re-schedule- no answer; unable to leave a message d/t vm has not been set-up.

## 2019-09-24 ENCOUNTER — Encounter: Payer: Self-pay | Admitting: Internal Medicine

## 2019-09-30 MED FILL — INVOKANA 100 MG TABLET: 100 | 30 days supply | Qty: 30 | Fill #1

## 2019-10-03 ENCOUNTER — Encounter: Payer: Self-pay | Admitting: *Deleted

## 2019-11-08 MED FILL — INVOKANA 100 MG TABLET: 100 | 30 days supply | Qty: 30 | Fill #2

## 2020-01-03 MED FILL — INVOKANA 100 MG TABLET: 100 | 30 days supply | Qty: 30 | Fill #3

## 2020-01-30 ENCOUNTER — Other Ambulatory Visit: Payer: Self-pay | Admitting: *Deleted

## 2020-01-30 DIAGNOSIS — E119 Type 2 diabetes mellitus without complications: Secondary | ICD-10-CM

## 2020-01-31 MED ORDER — GLYBURIDE-METFORMIN 5-500 MG PO TABS: 1.0000 | ORAL_TABLET | Freq: Two times a day (BID) | ORAL | 0 refills | Status: DC

## 2020-02-12 MED FILL — INVOKANA 100 MG TABLET: 100 | 30 days supply | Qty: 30 | Fill #3

## 2020-02-21 ENCOUNTER — Encounter: Payer: Self-pay | Admitting: Student

## 2020-02-28 ENCOUNTER — Encounter: Payer: Self-pay | Admitting: Internal Medicine

## 2020-02-28 ENCOUNTER — Other Ambulatory Visit: Payer: Self-pay

## 2020-02-28 ENCOUNTER — Ambulatory Visit: Payer: Self-pay | Admitting: Internal Medicine

## 2020-02-28 VITALS — BP 165/69 | HR 98 | Temp 98.5°F | Ht 74.0 in | Wt 179.3 lb

## 2020-02-28 DIAGNOSIS — E785 Hyperlipidemia, unspecified: Secondary | ICD-10-CM

## 2020-02-28 DIAGNOSIS — Z0000 Encounter for general adult medical examination without abnormal findings: Secondary | ICD-10-CM

## 2020-02-28 DIAGNOSIS — Z1159 Encounter for screening for other viral diseases: Secondary | ICD-10-CM

## 2020-02-28 DIAGNOSIS — I10 Essential (primary) hypertension: Secondary | ICD-10-CM

## 2020-02-28 DIAGNOSIS — Z794 Long term (current) use of insulin: Secondary | ICD-10-CM

## 2020-02-28 DIAGNOSIS — E119 Type 2 diabetes mellitus without complications: Secondary | ICD-10-CM

## 2020-02-28 DIAGNOSIS — N529 Male erectile dysfunction, unspecified: Secondary | ICD-10-CM

## 2020-02-28 LAB — POCT GLYCOSYLATED HEMOGLOBIN (HGB A1C): Hemoglobin A1C: 8.7 % — AB (ref 4.0–5.6)

## 2020-02-28 LAB — GLUCOSE, CAPILLARY: Glucose-Capillary: 268 mg/dL — ABNORMAL HIGH (ref 70–99)

## 2020-02-28 MED ORDER — BENAZEPRIL-HYDROCHLOROTHIAZIDE 20-12.5 MG PO TABS: 1.0000 | ORAL_TABLET | Freq: Every day | ORAL | 3 refills | Status: DC

## 2020-02-28 MED ORDER — METFORMIN HCL ER (OSM) 500 MG PO TB24: 500.0000 mg | ORAL_TABLET | Freq: Every day | ORAL | 0 refills | Status: DC

## 2020-02-28 MED ORDER — SILDENAFIL CITRATE 50 MG PO TABS: 50.0000 mg | ORAL_TABLET | Freq: Every day | ORAL | 0 refills | Status: DC | PRN

## 2020-02-28 MED ORDER — INVOKAMET XR 150-500 MG PO TB24: 2.0000 | ORAL_TABLET | Freq: Every day | ORAL | 0 refills | Status: DC

## 2020-02-28 MED ORDER — CANAGLIFLOZIN 300 MG PO TABS: 300.0000 mg | ORAL_TABLET | Freq: Every day | ORAL | 0 refills | Status: DC

## 2020-02-28 NOTE — Patient Instructions (Addendum)
Thank you for allowing [place] to provide your care today. Today we discussed your hypertension and type II diabetes  I have ordered the following labs for you:  Basic metabolic panel    I will call if any are abnormal.    Today we made the following changes to your medications:   Please START taking  Invokamet 150-500 mg - take two tablets daily in the morning  Please STOP taking   Glucovance  Invokana 100 mg  Please follow-up in one month to discuss change in medications and blood pressure check   and three months for hemoglobin A1C check and blood pressure check.    Please call the internal medicine center clinic if you have any questions or concerns, we may be able to help and keep you from a long and expensive emergency room wait. Our clinic and after hours phone number is [phone], the best time to call is Monday through Friday 9 am to 4 pm but there is always someone available 24/7 if you have an emergency. If you need medication refills please notify your pharmacy one week in advance and they will send [place] a request.

## 2020-02-28 NOTE — Progress Notes (Signed)
   CC: hypertension  HPI:  [name] is a 44 y.o. with PMH as below.   Please see A&P for assessment of the patient's acute and chronic medical conditions.     Past Medical History:  Diagnosis Date  . Diabetes mellitus 2002  . Hypertension    Review of Systems:   Review of Systems  Constitutional: Negative for malaise/fatigue and weight loss.  Eyes: Negative for blurred vision.  Respiratory: Negative for cough and shortness of breath.   Cardiovascular: Negative for chest pain and leg swelling.  Gastrointestinal: Positive for diarrhea. Negative for nausea and vomiting.  Genitourinary: Negative for dysuria, frequency and urgency.   Physical Exam: Constitution: NAD, appears stated age Cardio: RRR, no m/r/g, no LE edema  Respiratory: CTA, no w/r/r Abdominal: NTTP, soft, non-distended MSK: moving all extremities Neuro: normal affect, a&ox3 Skin: c/d/i    Vitals:   02/28/20 1012  BP: (!) 165/69  Pulse: 98  Temp: 98.5 F (36.9 C)  SpO2: 100%  Weight: 179 lb 4.8 oz (81.3 kg)  Height: 6\' 2"  (1.88 m)     Assessment & Plan:   See Encounters Tab for problem based charting.  Patient discussed with Dr. 

## 2020-02-29 LAB — BMP8+ANION GAP
Anion Gap: 15 mmol/L (ref 10.0–18.0)
BUN/Creatinine Ratio: 13 (ref 9–20)
BUN: 13 mg/dL (ref 6–24)
CO2: 23 mmol/L (ref 20–29)
Calcium: 9.4 mg/dL (ref 8.7–10.2)
Chloride: 98 mmol/L (ref 96–106)
Creatinine, Ser: 0.99 mg/dL (ref 0.76–1.27)
GFR calc Af Amer: 106 mL/min/{1.73_m2} (ref >59)
GFR calc non Af Amer: 92 mL/min/{1.73_m2} (ref >59)
Glucose: 318 mg/dL — ABNORMAL HIGH (ref 65–99)
Potassium: 4.3 mmol/L (ref 3.5–5.2)
Sodium: 136 mmol/L (ref 134–144)

## 2020-02-29 LAB — HEPATITIS C ANTIBODY: Hep C Virus Ab: 0.1 s/co ratio (ref 0.0–0.9)

## 2020-02-29 LAB — LIPID PANEL
Chol/HDL Ratio: 4.5 ratio (ref 0.0–5.0)
Cholesterol, Total: 274 mg/dL — ABNORMAL HIGH (ref 100–199)
HDL: 61 mg/dL (ref >39)
LDL Chol Calc (NIH): 189 mg/dL — ABNORMAL HIGH (ref 0–99)
Triglycerides: 131 mg/dL (ref 0–149)
VLDL Cholesterol Cal: 24 mg/dL (ref 5–40)

## 2020-03-01 DIAGNOSIS — Z0000 Encounter for general adult medical examination without abnormal findings: Secondary | ICD-10-CM

## 2020-03-01 HISTORY — DX: Encounter for general adult medical examination without abnormal findings: Z00.00

## 2020-03-01 MED ORDER — ROSUVASTATIN CALCIUM 20 MG PO TABS: 20.0000 mg | ORAL_TABLET | Freq: Every day | ORAL | 3 refills | Status: DC

## 2020-03-01 NOTE — Assessment & Plan Note (Signed)
BP Readings from Last 3 Encounters:  02/28/20 (!) 165/69  08/05/19 (!) 158/95  03/04/19 136/79   Blood pressure elevated today. Medications include benazepril-HCTZ 20-12.5 mg qd, which he ran out of.   - refill benazepril-HCTZ 20-12.5 mg qd  - bmp - f/u one month for bp check

## 2020-03-01 NOTE — Assessment & Plan Note (Signed)
-  refilled viagra 

## 2020-03-01 NOTE — Assessment & Plan Note (Signed)
>>  ASSESSMENT AND PLAN FOR DIABETES MELLITUS TYPE 2, INSULIN  DEPENDENT ([facility]) WRITTEN ON 03/01/2020  1:03 PM BY SEAWELL, JAIMIE A, DO  Last A1c 8.5 02/2019. He has been taking glucovance  5-500 bid and invokana  100 mg. He  He check his glucose at home and this is often around 100 in the mornings. He endorses intermittent symptoms of hypoglycemia. He sometimes has GI side effects with the metformin .   - stop glucovance , possibly hypoglycemia with glyburide   - start invakomet 150-500 mg xr bid - referred to ophthalmology - foot exam today - he has skin breakdown in between toes of left foot due to dampness, no sign of infection. Discussed keeping this area dry to help skin heal  - BMP  - refill rosuvastatin

## 2020-03-01 NOTE — Assessment & Plan Note (Addendum)
-   discussed flu and covid vaccination - he does not want the flu vaccine but will look into getting the covid vaccination  - discussed need for Tdap - HCV screening

## 2020-03-01 NOTE — Assessment & Plan Note (Signed)
Last A1c 8.5 02/2019. He has been taking glucovance 5-500 bid and invokana 100 mg. He  He check his glucose at home and this is often around 100 in the mornings. He endorses intermittent symptoms of hypoglycemia. He sometimes has GI side effects with the metformin.   - stop glucovance, possibly hypoglycemia with glyburide  - start invakomet 150-500 mg xr bid - referred to ophthalmology - foot exam today - he has skin breakdown in between toes of left foot due to dampness, no sign of infection. Discussed keeping this area dry to help skin heal  - BMP  - refill rosuvastatin

## 2020-03-02 MED ORDER — INVOKAMET XR 150-500 MG PO TB24: 2.0000 | ORAL_TABLET | Freq: Every day | ORAL | 0 refills | Status: DC

## 2020-03-02 NOTE — Addendum Note (Signed)
Addended by: Guinevere Scarlet A on: 03/02/2020 11:03 AM   Modules accepted: Orders

## 2020-03-03 ENCOUNTER — Encounter: Payer: Self-pay | Admitting: Student

## 2020-03-04 ENCOUNTER — Telehealth: Payer: Self-pay | Admitting: Student

## 2020-03-04 NOTE — Telephone Encounter (Signed)
Refill Request-   Pt states he can not afford his medication prescribed during his last vist on 02/28/2020 .    Pt would like to speak with a nurse about what medication he can take that he can afford.  [facility] - [place], [place] - [address]

## 2020-03-04 NOTE — Addendum Note (Signed)
Addended by: Neomia Dear on: 03/04/2020 07:20 PM   Modules accepted: Orders

## 2020-03-06 NOTE — Progress Notes (Signed)
Internal Medicine Clinic Attending  Case discussed with Dr. Seawell  At the time of the visit.  We reviewed the resident's history and exam and pertinent patient test results.  I agree with the assessment, diagnosis, and plan of care documented in the resident's note.  

## 2020-03-16 ENCOUNTER — Ambulatory Visit (INDEPENDENT_AMBULATORY_CARE_PROVIDER_SITE_OTHER): Payer: Self-pay | Admitting: Internal Medicine

## 2020-03-16 ENCOUNTER — Telehealth: Payer: Self-pay | Admitting: *Deleted

## 2020-03-16 ENCOUNTER — Other Ambulatory Visit: Payer: Self-pay

## 2020-03-16 VITALS — BP 136/79 | HR 96 | Temp 98.4°F | Ht 74.0 in | Wt 173.4 lb

## 2020-03-16 DIAGNOSIS — R350 Frequency of micturition: Secondary | ICD-10-CM

## 2020-03-16 DIAGNOSIS — E1169 Type 2 diabetes mellitus with other specified complication: Secondary | ICD-10-CM

## 2020-03-16 DIAGNOSIS — E119 Type 2 diabetes mellitus without complications: Secondary | ICD-10-CM

## 2020-03-16 DIAGNOSIS — R110 Nausea: Secondary | ICD-10-CM

## 2020-03-16 DIAGNOSIS — R631 Polydipsia: Secondary | ICD-10-CM

## 2020-03-16 DIAGNOSIS — Z794 Long term (current) use of insulin: Secondary | ICD-10-CM

## 2020-03-16 DIAGNOSIS — R197 Diarrhea, unspecified: Secondary | ICD-10-CM

## 2020-03-16 DIAGNOSIS — I10 Essential (primary) hypertension: Secondary | ICD-10-CM

## 2020-03-16 MED ORDER — CANAGLIFLOZIN 300 MG PO TABS: 300.0000 mg | ORAL_TABLET | Freq: Every day | ORAL | 3 refills | Status: DC

## 2020-03-16 MED ORDER — JANUMET 50-500 MG PO TABS: 1.0000 | ORAL_TABLET | Freq: Two times a day (BID) | ORAL | 3 refills | Status: DC

## 2020-03-16 MED FILL — INVOKANA 300 MG TABLET: 300 | 30 days supply | Qty: 30 | Fill #0

## 2020-03-16 MED FILL — JANUMET 50-500 MG TABLET: 50-500 | 30 days supply | Qty: 60 | Fill #0

## 2020-03-16 NOTE — Telephone Encounter (Signed)
Patient and wife, Carlos [name], called in requesting appt. States patient has been vomiting mostly at night x 1 week. Has not been able to sleep and is now feeling weak. Denies fever, chills, cough. States he took his last tab of glucovance yesterday. Today's fasting blood sugar is 270. He has not p/u Rx for invokamet as it costs $1800. Appt given today at 1:15 with Yellow Team as Red Team is full. Kinnie Feil, BSN, RN-BC

## 2020-03-16 NOTE — Progress Notes (Signed)
   CC: Nausea  HPI: Mr.Witten Stockley is a 44 y.o. with PMH listed below presenting with complaint of nausea. Please see problem based assessment and plan for further details.  Past Medical History:  Diagnosis Date  . Diabetes mellitus 2002  . Hypertension    Review of Systems: Review of Systems  Constitutional: Negative for chills, fever and malaise/fatigue.  Eyes: Negative for blurred vision.  Respiratory: Negative for shortness of breath.   Cardiovascular: Negative for chest pain.  Gastrointestinal: Positive for diarrhea and nausea. Negative for constipation and vomiting.  Genitourinary: Positive for frequency. Negative for dysuria and urgency.  Endo/Heme/Allergies: Positive for polydipsia.     Physical Exam: Vitals:   03/16/20 1331 03/16/20 1334  BP:  136/79  Pulse:  96  Temp:  98.4 F (36.9 C)  TempSrc:  Oral  SpO2:  100%  Weight: 173 lb 6.4 oz (78.7 kg)   Height: 6\' 2"  (1.88 m)    Gen: Well-developed, well nourished, NAD HEENT: NCAT head, hearing intact CV: RRR, S1, S2 normal Pulm: CTAB, No rales, no wheezes Abd: BS+, NTND, no rebound, no guarding Extm: ROM intact, Peripheral pulses intact, No peripheral edema Skin: Dry, Warm, normal turgor, no wounds, no rashes, no lesions  Assessment & Plan:   Hypertension, essential BP Readings from Last 3 Encounters:  03/16/20 136/79  02/28/20 (!) 165/69  08/05/19 (!) 158/95   Blood pressure closer to goal. Currently on benazepril-hCTZ 20-12.5mg  daily. Had intermittent adherence due to cost issues but mentions he will try to take regularly. Denies any light-headedness, cough, rash.  - C/w benazepril-hctz 20-12.5mg  daily  Diabetes mellitus type 2, insulin dependent ([facility]) Lab Results  Component Value Date   HGBA1C 8.7 (A) 02/28/2020   Mr.Melroy is a 44 yo M w/ PMH of diabetes presenting with complaint of nausea. He mentions having some 'queasiness' recently after not taking any of his diabetic medications for a  month. He was previously on glucovance 5-500mg  BID and Invokana 100mg  daily and was getting discounted price fro [facility] outpatient pharmacy but his regimen were changed due to episodes of hypoglycemia. Unfortunately the combination pill he was started on, he was told would cost him $1800 a month, which he is unable to afford due to his lack of insurance. Mentions some polyuria. Denies any polydipsia or polyphagia.  A/P Present for f/u management of diabetes. Complicated by inability to afford meds. Have not taken any meds for 1 month. Suspect episodes of nausea and systemic symptoms related to hyperglycemia. Currently denies nausea, vomiting, malaise. Diabetic regimen limited due to lack of insurance. Discount program at [facility] reviewed. Janumet and Invokana are on discount formulary. - Start canagliflozin 300mg  daily - Start Janumet 50-500mg  BID - F/u in 3 months    Patient discussed with Dr. [facility]  -[facility], PGY3 [facility] Internal Medicine Pager: [phone]

## 2020-03-16 NOTE — Patient Instructions (Signed)
Thank you for allowing [place] to provide your care today. Today we discussed your diabetes    I have ordered no labs for you. I will call if any are abnormal.    Today we made the following changes to your medications.    Please take canagliflozin 300mg  daily Please take Janumet 500mg  twice daily  Please follow-up in 3 months.    Should you have any questions or concerns please call the internal medicine clinic at [phone].     Diabetes Mellitus and Nutrition, Adult When you have diabetes (diabetes mellitus), it is very important to have healthy eating habits because your blood sugar (glucose) levels are greatly affected by what you eat and drink. Eating healthy foods in the appropriate amounts, at about the same times every day, can help you:  Control your blood glucose.  Lower your risk of heart disease.  Improve your blood pressure.  Reach or maintain a healthy weight. Every person with diabetes is different, and each person has different needs for a meal plan. Your health care provider may recommend that you work with a diet and nutrition specialist (dietitian) to make a meal plan that is best for you. Your meal plan may vary depending on factors such as:  The calories you need.  The medicines you take.  Your weight.  Your blood glucose, blood pressure, and cholesterol levels.  Your activity level.  Other health conditions you have, such as heart or kidney disease. How do carbohydrates affect me? Carbohydrates, also called carbs, affect your blood glucose level more than any other type of food. Eating carbs naturally raises the amount of glucose in your blood. Carb counting is a method for keeping track of how many carbs you eat. Counting carbs is important to keep your blood glucose at a healthy level, especially if you use insulin or take certain oral diabetes medicines. It is important to know how many carbs you can safely have in each meal. This is different for every  person. Your dietitian can help you calculate how many carbs you should have at each meal and for each snack. Foods that contain carbs include:  Bread, cereal, rice, pasta, and crackers.  Potatoes and corn.  Peas, beans, and lentils.  Milk and yogurt.  Fruit and juice.  Desserts, such as cakes, cookies, ice cream, and candy. How does alcohol affect me? Alcohol can cause a sudden decrease in blood glucose (hypoglycemia), especially if you use insulin or take certain oral diabetes medicines. Hypoglycemia can be a life-threatening condition. Symptoms of hypoglycemia (sleepiness, dizziness, and confusion) are similar to symptoms of having too much alcohol. If your health care provider says that alcohol is safe for you, follow these guidelines:  Limit alcohol intake to no more than 1 drink per day for nonpregnant women and 2 drinks per day for men. One drink equals 12 oz of beer, 5 oz of wine, or 1 oz of hard liquor.  Do not drink on an empty stomach.  Keep yourself hydrated with water, diet soda, or unsweetened iced tea.  Keep in mind that regular soda, juice, and other mixers may contain a lot of sugar and must be counted as carbs. What are tips for following this plan?  Reading food labels  Start by checking the serving size on the "Nutrition Facts" label of packaged foods and drinks. The amount of calories, carbs, fats, and other nutrients listed on the label is based on one serving of the item. Many items contain more  than one serving per package.  Check the total grams (g) of carbs in one serving. You can calculate the number of servings of carbs in one serving by dividing the total carbs by 15. For example, if a food has 30 g of total carbs, it would be equal to 2 servings of carbs.  Check the number of grams (g) of saturated and trans fats in one serving. Choose foods that have low or no amount of these fats.  Check the number of milligrams (mg) of salt (sodium) in one serving.  Most people should limit total sodium intake to less than 2,300 mg per day.  Always check the nutrition information of foods labeled as "low-fat" or "nonfat". These foods may be higher in added sugar or refined carbs and should be avoided.  Talk to your dietitian to identify your daily goals for nutrients listed on the label. Shopping  Avoid buying canned, premade, or processed foods. These foods tend to be high in fat, sodium, and added sugar.  Shop around the outside edge of the grocery store. This includes fresh fruits and vegetables, bulk grains, fresh meats, and fresh dairy. Cooking  Use low-heat cooking methods, such as baking, instead of high-heat cooking methods like deep frying.  Cook using healthy oils, such as olive, canola, or sunflower oil.  Avoid cooking with butter, cream, or high-fat meats. Meal planning  Eat meals and snacks regularly, preferably at the same times every day. Avoid going long periods of time without eating.  Eat foods high in fiber, such as fresh fruits, vegetables, beans, and whole grains. Talk to your dietitian about how many servings of carbs you can eat at each meal.  Eat 4-6 ounces (oz) of lean protein each day, such as lean meat, chicken, fish, eggs, or tofu. One oz of lean protein is equal to: ? 1 oz of meat, chicken, or fish. ? 1 egg. ?  cup of tofu.  Eat some foods each day that contain healthy fats, such as avocado, nuts, seeds, and fish. Lifestyle  Check your blood glucose regularly.  Exercise regularly as told by your health care provider. This may include: ? 150 minutes of moderate-intensity or vigorous-intensity exercise each week. This could be brisk walking, biking, or water aerobics. ? Stretching and doing strength exercises, such as yoga or weightlifting, at least 2 times a week.  Take medicines as told by your health care provider.  Do not use any products that contain nicotine or tobacco, such as cigarettes and e-cigarettes.  If you need help quitting, ask your health care provider.  Work with a Veterinary surgeon or diabetes educator to identify strategies to manage stress and any emotional and social challenges. Questions to ask a health care provider  Do I need to meet with a diabetes educator?  Do I need to meet with a dietitian?  What number can I call if I have questions?  When are the best times to check my blood glucose? Where to find more information:  American Diabetes Association: diabetes.org  Academy of Nutrition and Dietetics: www.eatright.[employer] of Diabetes and Digestive and Kidney Diseases (NIH): CarFlippers.tn Summary  A healthy meal plan will help you control your blood glucose and maintain a healthy lifestyle.  Working with a diet and nutrition specialist (dietitian) can help you make a meal plan that is best for you.  Keep in mind that carbohydrates (carbs) and alcohol have immediate effects on your blood glucose levels. It is important to count carbs  and to use alcohol carefully. This information is not intended to replace advice given to you by your health care provider. Make sure you discuss any questions you have with your health care provider. Document Revised: 05/12/2017 Document Reviewed: 07/04/2016 Elsevier Patient Education  2020 [employer].

## 2020-03-17 ENCOUNTER — Encounter: Payer: Self-pay | Admitting: Internal Medicine

## 2020-03-17 NOTE — Progress Notes (Signed)
Internal Medicine Clinic Attending  Case discussed with Dr. Lee  At the time of the visit.  We reviewed the resident's history and exam and pertinent patient test results.  I agree with the assessment, diagnosis, and plan of care documented in the resident's note.    

## 2020-03-17 NOTE — Assessment & Plan Note (Signed)
BP Readings from Last 3 Encounters:  03/16/20 136/79  02/28/20 (!) 165/69  08/05/19 (!) 158/95   Blood pressure closer to goal. Currently on benazepril-hCTZ 20-12.5mg  daily. Had intermittent adherence due to cost issues but mentions he will try to take regularly. Denies any light-headedness, cough, rash.  - C/w benazepril-hctz 20-12.5mg  daily

## 2020-03-17 NOTE — Assessment & Plan Note (Addendum)
Lab Results  Component Value Date   HGBA1C 8.7 (A) 02/28/2020   [name] is a 44 yo M w/ PMH of diabetes presenting with complaint of nausea. He mentions having some 'queasiness' recently after not taking any of his diabetic medications for a month. He was previously on glucovance 5-500mg  BID and Invokana 100mg  daily and was getting discounted price fro [facility] outpatient pharmacy but his regimen were changed due to episodes of hypoglycemia. Unfortunately the combination pill he was started on, he was told would cost him $1800 a month, which he is unable to afford due to his lack of insurance. Mentions some polyuria. Denies any polydipsia or polyphagia.  A/P Present for f/u management of diabetes. Complicated by inability to afford meds. Have not taken any meds for 1 month. Suspect episodes of nausea and systemic symptoms related to hyperglycemia. Currently denies nausea, vomiting, malaise. Diabetic regimen limited due to lack of insurance. Discount program at [facility] reviewed. Janumet and Invokana are on discount formulary. - Start canagliflozin 300mg  daily - Start Janumet 50-500mg  BID - F/u in 3 months

## 2020-03-17 NOTE — Assessment & Plan Note (Signed)
>>  ASSESSMENT AND PLAN FOR DIABETES MELLITUS TYPE 2, INSULIN  DEPENDENT (HCC) WRITTEN ON 03/17/2020  4:16 PM BY LEE, JOSHUA K, MD  Lab Results  Component Value Date   HGBA1C 8.7 (A) 02/28/2020   [name] is a 44 yo M w/ PMH of diabetes presenting with complaint of nausea. He mentions having some 'queasiness' recently after not taking any of his diabetic medications for a month. He was previously on glucovance  5-500mg  BID and Invokana  100mg  daily and was getting discounted price fro [facility] outpatient pharmacy but his regimen were changed due to episodes of hypoglycemia. Unfortunately the combination pill he was started on, he was told would cost him $1800 a month, which he is unable to afford due to his lack of insurance. Mentions some polyuria. Denies any polydipsia or polyphagia.  A/P Present for f/u management of diabetes. Complicated by inability to afford meds. Have not taken any meds for 1 month. Suspect episodes of nausea and systemic symptoms related to hyperglycemia. Currently denies nausea, vomiting, malaise. Diabetic regimen limited due to lack of insurance. Discount program at [facility] reviewed. Janumet  and Invokana  are on discount formulary. - Start canagliflozin  300mg  daily - Start Janumet  50-500mg  BID - F/u in 3 months

## 2020-03-20 ENCOUNTER — Other Ambulatory Visit: Payer: Self-pay | Admitting: Internal Medicine

## 2020-03-20 ENCOUNTER — Telehealth: Payer: Self-pay | Admitting: *Deleted

## 2020-03-20 DIAGNOSIS — E119 Type 2 diabetes mellitus without complications: Secondary | ICD-10-CM

## 2020-03-20 DIAGNOSIS — Z794 Long term (current) use of insulin: Secondary | ICD-10-CM

## 2020-03-20 MED ORDER — CANAGLIFLOZIN 100 MG PO TABS: 100.0000 mg | ORAL_TABLET | Freq: Every day | ORAL | 2 refills | Status: DC

## 2020-03-20 MED ORDER — METFORMIN HCL 500 MG PO TABS: 500.0000 mg | ORAL_TABLET | Freq: Two times a day (BID) | ORAL | 3 refills | Status: DC

## 2020-03-20 MED FILL — METFORMIN HCL 500 MG TABS: 500 | 30 days supply | Qty: 60 | Fill #0

## 2020-03-20 MED FILL — INVOKANA 100 MG TABLET: 100 | 30 days supply | Qty: 30 | Fill #0

## 2020-03-20 NOTE — Telephone Encounter (Addendum)
Attempted to call Carlos [name] to discuss his symptoms. Patient did not pick up voicemail not set up.  Possibly due to symptomatic hypertension. Doubtful that Invokana or Janumet is cause of his symptoms although description of his symptoms is unclear. Will try to call again later today.  Spoke with Carlos [name] regarding his symtoms. He mentions that after he took the higher dose of canagliflozin and Janumet, had symptoms of palpitations and 'heart pulling' but states symptoms resolved after stopping the meds. Denies any dysuria, urgency, frequency. Denies any diarrhea, nausea, vomiting. Currently chest pain free. Discussed his previous doses of the medications and mentions that he had been on invokana 100mg  without significant side effects and metformin 500mg  BID w/ mild side effects. Will re-start diabetic regimen at these doses.

## 2020-03-20 NOTE — Addendum Note (Signed)
Addended by: Theotis Barrio on: 03/20/2020 11:21 AM   Modules accepted: Orders

## 2020-03-20 NOTE — Telephone Encounter (Signed)
Patient and wife called to report patient has felt "sluggish, like a zombie and pulling on my heart, like I'm going to have a heart attack" since increasing invokana and starting Janumet. Took 2 doses of Invokana 300 mg and 3 doses of Janumet. Does not want to take them anymore. Today's fasting blood sugar 158. States they took his BP at 1 AM this morning 168/135. Has not yet taking BP med. Advised to take benazepril-hydrochlorthiazide 20-12.5 MG tablet now. States he will. Explained that he should continue taking his BP med same time everyday. Will forward to [place] as patient saw Dr. Nedra Hai on 03/16/2020. Kinnie Feil, BSN, RN-BC

## 2020-04-24 MED FILL — INVOKANA 100 MG TABLET: 100 | 30 days supply | Qty: 30 | Fill #1

## 2020-05-04 MED FILL — METFORMIN HCL 500 MG TABS: 500 | 30 days supply | Qty: 60 | Fill #1

## 2020-05-22 ENCOUNTER — Other Ambulatory Visit: Payer: Self-pay

## 2020-05-22 ENCOUNTER — Emergency Department (HOSPITAL_BASED_OUTPATIENT_CLINIC_OR_DEPARTMENT_OTHER)
Admission: EM | Admit: 2020-05-22 | Discharge: 2020-05-22 | Disposition: A | Discharge status: Home / Self Care | Payer: Self-pay | Attending: Emergency Medicine | Admitting: Emergency Medicine

## 2020-05-22 ENCOUNTER — Encounter (HOSPITAL_BASED_OUTPATIENT_CLINIC_OR_DEPARTMENT_OTHER): Payer: Self-pay | Admitting: *Deleted

## 2020-05-22 ENCOUNTER — Emergency Department (HOSPITAL_BASED_OUTPATIENT_CLINIC_OR_DEPARTMENT_OTHER): Payer: Self-pay

## 2020-05-22 DIAGNOSIS — I10 Essential (primary) hypertension: Secondary | ICD-10-CM | POA: Insufficient documentation

## 2020-05-22 DIAGNOSIS — M5412 Radiculopathy, cervical region: Secondary | ICD-10-CM | POA: Insufficient documentation

## 2020-05-22 DIAGNOSIS — Z7984 Long term (current) use of oral hypoglycemic drugs: Secondary | ICD-10-CM | POA: Insufficient documentation

## 2020-05-22 DIAGNOSIS — Z87891 Personal history of nicotine dependence: Secondary | ICD-10-CM | POA: Insufficient documentation

## 2020-05-22 DIAGNOSIS — E119 Type 2 diabetes mellitus without complications: Secondary | ICD-10-CM | POA: Insufficient documentation

## 2020-05-22 DIAGNOSIS — Z79899 Other long term (current) drug therapy: Secondary | ICD-10-CM | POA: Insufficient documentation

## 2020-05-22 MED ORDER — METHOCARBAMOL 500 MG PO TABS: 500.0000 mg | ORAL_TABLET | Freq: Every evening | ORAL | 0 refills | Status: DC | PRN

## 2020-05-22 MED ORDER — LIDOCAINE 5 % EX PTCH: 1.0000 | MEDICATED_PATCH | CUTANEOUS | 0 refills | Status: DC

## 2020-05-22 MED ORDER — PREDNISONE 10 MG (21) PO TBPK: ORAL_TABLET | Freq: Every day | ORAL | 0 refills | Status: DC

## 2020-05-22 MED ORDER — KETOROLAC TROMETHAMINE 15 MG/ML IJ SOLN
15.0000 mg | Freq: Once | INTRAMUSCULAR | Status: AC
  Administered 2020-05-22: 15 mg via INTRAMUSCULAR
  Filled 2020-05-22: qty 1

## 2020-05-22 NOTE — Discharge Instructions (Signed)
Take prednisone as prescribed.  Take entire course.  It is important that you are carefully monitoring your blood sugar during this, as it may cause it to spike. Do not take other anti-inflammatory such as Advil, ibuprofen, naproxen, Aleve while taking this medicine. Use Tylenol as needed for pain. Use the muscle relaxer as needed for muscle stiffness or soreness.  Have caution, this may make you tired or groggy.  I recommend you take it at night before you go to bed. Use the Lidoderm patch to help with pain, put it at the base of your neck once a day as needed. Follow-up with the neurosurgeon listed below as needed if your symptoms not improving in the next several days. Return to the emergency room if you develop fevers, severe worsening pain, if you are dropping things with your right hand, or any new, worsening, or concerning symptoms.

## 2020-05-22 NOTE — ED Provider Notes (Signed)
[facility] EMERGENCY DEPARTMENT Provider Note   CSN: [id number] Arrival date & time: 05/22/20  1705     History Chief Complaint  Patient presents with  . Back Pain    Carlos [name] is a 44 y.o. male presenting for evaluation of neck pain, right arm pain, and numbness.  Patient states several months ago he fell off the back of a truck.  Since then, he has had discomfort in his right side neck, shoulder, and arm.  He has been seeing a chiropractor about this, however continues to have shooting pain down his arm, numbness in his thumb, and decrease in station of his hand.  He has been taking approximately 400 mg of ibuprofen 1-2 times a day without improvement.  He has not taken anything else for his symptoms.  He was not evaluated initially after the fall.  He denies pain elsewhere in his back or chest.  No pain on the left side.  He denies headache.  He denies previous history of neck problems.  He does not have an orthopedic doctor or neurosurgeon that he sees.  Additional history taken chart reviewed.  Patient with a history of diabetes, patient states is well controlled and not on insulin.  Additional history of hypertension.  HPI     Past Medical History:  Diagnosis Date  . Diabetes mellitus 2002  . Hypertension     Patient Active Problem List   Diagnosis Date Noted  . Healthcare maintenance 03/01/2020  . Hyperlipidemia 03/04/2019  . 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 Tenderness of right calf 11/14/2018  . Constipation 01/05/2018  . Vision abnormalities 01/05/2018  . Erectile dysfunction 05/10/2017  . 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 Diabetes mellitus type 2, insulin dependent (HCC) 01/02/2016  . Hypertension, essential 01/02/2016  . Tobacco abuse 01/02/2016  . Onychomycosis due to dermatophyte 10/24/2012  . Pain in joint, ankle and foot 10/24/2012    Past Surgical History:  Procedure Laterality Date  . FEMUR FRACTURE  SURGERY     right  . INCISION AND DRAINAGE PERIRECTAL ABSCESS N/A 01/02/2016   Procedure: IRRIGATION AND DEBRIDEMENT PERIRECTAL ABSCESS;  Surgeon: Axel Filler, MD;  Location: [facility] OR;  Service: General;  Laterality: N/A;       No family history on file.  Social History   Tobacco Use  . Smoking status: Former Smoker    Packs/day: 0.10    Years: 10.00    Pack years: 1.00    Types: Cigars    Quit date: 12/22/2015    Years since quitting: 4.4  . Smokeless tobacco: Never Used  . Tobacco comment: Recently quit  Vaping Use  . Vaping Use: Never used  Substance Use Topics  . Alcohol use: Yes    Comment: OccAsional  . Drug use: Yes    Types: Marijuana    Comment: Occasionally.    Home Medications Prior to Admission medications   Medication Sig Start Date End Date Taking? Authorizing Provider  benazepril-hydrochlorthiazide (LOTENSIN HCT) 20-12.5 MG tablet Take 1 tablet by mouth daily. 02/28/20   Seawell, Jaimie A, DO  canagliflozin (INVOKANA) 100 MG TABS tablet Take 1 tablet (100 mg total) by mouth daily before breakfast. 03/20/20   Theotis Barrio, MD  Cinnamon 500 MG capsule Take 500 mg by mouth daily.    [provider]  cyclobenzaprine (FLEXERIL) 10 MG tablet Take 1 tablet (10 mg total) by mouth 2 (two) times daily as needed for muscle spasms. 08/06/19   Mesner, Barbara Cower,  MD  glucose blood test strip The patient is insulin requiring, ICD 10 code E11.9. The patient tests 4 times per day. 09/16/16   Rivet, Iris Pert, MD  lidocaine (LIDODERM) 5 % Place 1 patch onto the skin daily. Remove & Discard patch within 12 hours or as directed by MD 05/22/20   Safi Culotta, PA-C  metFORMIN (GLUCOPHAGE) 500 MG tablet Take 1 tablet (500 mg total) by mouth 2 (two) times daily with a meal. 03/20/20 03/20/21  Theotis Barrio, MD  methocarbamol (ROBAXIN) 500 MG tablet Take 1 tablet (500 mg total) by mouth at bedtime as needed for muscle spasms. 05/22/20   Powhatan Shellhammer, PA-C  ondansetron (ZOFRAN)  4 MG tablet Take 1 tablet (4 mg total) by mouth every 8 (eight) hours as needed. 08/24/17   Tegeler, Canary Brim, MD  predniSONE (STERAPRED UNI-PAK 21 TAB) 10 MG (21) TBPK tablet Take by mouth daily. Take 6 tabs by mouth daily  for 2 days, then 5 tabs for 2 days, then 4 tabs for 2 days, then 3 tabs for 2 days, 2 tabs for 2 days, then 1 tab by mouth daily for 2 days 05/22/20   Aria Jarrard, PA-C  rosuvastatin (CRESTOR) 20 MG tablet Take 1 tablet (20 mg total) by mouth daily. 03/01/20   Seawell, Jaimie A, DO  sildenafil (VIAGRA) 50 MG tablet Take 1 tablet (50 mg total) by mouth daily as needed for erectile dysfunction. 02/28/20   Seawell, Jaimie A, DO    Allergies    Penicillins and Sitagliptin-metformin hcl  Review of Systems   Review of Systems  Musculoskeletal: Positive for myalgias and neck pain.  Neurological: Positive for numbness.    Physical Exam Updated Vital Signs BP 137/84 (BP Location: Left Arm)   Pulse 84   Temp 98.6 F (37 C) (Oral)   Resp 20   Ht 6\' 2"  (1.88 m)   Wt 79.4 kg   SpO2 100%   BMI 22.47 kg/m   Physical Exam Vitals and nursing note reviewed.  Constitutional:      General: He is not in acute distress.    Appearance: He is well-developed and well-nourished.     Comments: Resting in the bed in no acute distress  HENT:     Head: Normocephalic and atraumatic.  Eyes:     Extraocular Movements: Extraocular movements intact and EOM normal.     Pupils: Pupils are equal, round, and reactive to light.  Neck:     Comments: No TTP of midline C-spine, however pt with pain just to the right of the C-spine.  Increased pain when he looks up towards the ceiling and towards the right.  No pain looking down to the left. Cardiovascular:     Rate and Rhythm: Normal rate and regular rhythm.     Pulses: Normal pulses.  Pulmonary:     Effort: Pulmonary effort is normal.     Breath sounds: Normal breath sounds.  Abdominal:     General: There is no distension.   Musculoskeletal:        General: Normal range of motion.     Cervical back: Normal range of motion and neck supple.     Comments: Radial pulses 2+ bilaterally.  Grip strength equal bilaterally.  Active upper extremity equal bilaterally.  Patient reports slightly decreased sensation of the distal right thumb when compared to the left.  Good cap refill.  Skin:    General: Skin is warm.     Capillary Refill: Capillary  refill takes less than 2 seconds.     Findings: No rash.  Neurological:     Mental Status: He is alert and oriented to person, place, and time.  Psychiatric:        Mood and Affect: Mood and affect normal.     ED Results / Procedures / Treatments   Labs (all labs ordered are listed, but only abnormal results are displayed) Labs Reviewed - No data to display  EKG None  Radiology CT Cervical Spine Wo Contrast  Result Date: 05/22/2020 CLINICAL DATA:  Cervical radiculopathy, fall from tow truck 4 months ago, right-sided thumb numbness and right arm and shoulder pain EXAM: CT CERVICAL SPINE WITHOUT CONTRAST TECHNIQUE: Multidetector CT imaging of the cervical spine was performed without intravenous contrast. Multiplanar CT image reconstructions were also generated. COMPARISON:  None. FINDINGS: Alignment: Straightening of the normal cervical lordosis. Skull base and vertebrae: No acute fracture. No primary bone lesion or focal pathologic process. Soft tissues and spinal canal: No prevertebral fluid or swelling. No visible canal hematoma. Disc levels: There is minimal multilevel disc space height loss and uncovertebral change, particularly notable at the right aspect of C4-C5 (series 11, image 27). No overt disc bulge appreciated by noncontrast CT. Upper chest: Negative. Other: None. IMPRESSION: 1. No fracture or static subluxation of the cervical spine. 2. There is minimal multilevel disc space height loss and uncovertebral change, particularly notable at the right aspect of C4-C5.  No overt disc bulge appreciated by noncontrast CT. Consider MRI to further evaluate cervical disc and neural foraminal pathology if indicated by localizing signs and symptoms. Electronically Signed   By: Lauralyn Primes M.D.   On: 05/22/2020 18:41    Procedures Procedures (including critical care time)  Medications Ordered in ED Medications  ketorolac (TORADOL) 15 MG/ML injection 15 mg (15 mg Intramuscular Given 05/22/20 1836)    ED Course  I have reviewed the triage vital signs and the nursing notes.  Pertinent labs & imaging results that were available during my care of the patient were reviewed by me and considered in my medical decision making (see chart for details).    MDM Rules/Calculators/A&P                          Patient presenting for evaluation of neck pain with shooting pain down her left arm with intermittent numbness.  On exam, patient appears nontoxic.  He is neurovascularly intact.  However as symptoms began after a fall, will obtain CT imaging to ensure no bony abnormality.  Likely cervical radiculopathy.  CT negative for acute findings.  Does show minimal multilevel discs space height loss particularly noticeable at the right aspect of C4-C5.  No obvious disc bulge.  Discussed findings with patient.  Discussed Intermatic treatment with a steroid taper.  Discussed importance of close monitoring of blood sugars.  Follow-up with neurosurgery as needed.  At this time, patient appears safe for discharge.  Return precautions given.  Patient states he understands and agrees to plan.   Final Clinical Impression(s) / ED Diagnoses Final diagnoses:  Cervical radiculopathy    Rx / DC Orders ED Discharge Orders         Ordered    predniSONE (STERAPRED UNI-PAK 21 TAB) 10 MG (21) TBPK tablet  Daily        05/22/20 1850    methocarbamol (ROBAXIN) 500 MG tablet  At bedtime PRN        05/22/20 1850  lidocaine (LIDODERM) 5 %  Every 24 hours        05/22/20 1850            Alveria ApleyCaccavale, Adalay Azucena, PA-C 05/22/20 1904    Milagros Lollykstra, Richard S, MD 05/22/20 2102

## 2020-05-22 NOTE — ED Triage Notes (Signed)
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 Back pain a month ago. He was seen by a chiropractor with some improvement. He has had numbness in his right thumb since that time. Pain in his back, right arm and shoulder since the chiropractor visit.

## 2020-06-04 MED FILL — INVOKANA 100 MG TABLET: 100 | 30 days supply | Qty: 30 | Fill #2

## 2020-06-22 MED FILL — METFORMIN HCL 500 MG TABS: 500 | 30 days supply | Qty: 60 | Fill #2

## 2020-07-08 MED FILL — INVOKANA 100 MG TABLET: 100 | 30 days supply | Qty: 30 | Fill #3

## 2020-07-28 MED FILL — METFORMIN HCL 500 MG TABS: 500 | 30 days supply | Qty: 60 | Fill #3

## 2020-08-11 MED FILL — INVOKANA 100 MG TABLET: 100 | 30 days supply | Qty: 30 | Fill #4

## 2020-09-04 ENCOUNTER — Other Ambulatory Visit: Payer: Self-pay | Admitting: Internal Medicine

## 2020-09-04 DIAGNOSIS — E119 Type 2 diabetes mellitus without complications: Secondary | ICD-10-CM

## 2020-09-04 DIAGNOSIS — Z794 Long term (current) use of insulin: Secondary | ICD-10-CM

## 2020-09-04 MED FILL — METFORMIN HCL 500 MG TABS: 500 | 30 days supply | Qty: 60 | Fill #0

## 2020-09-14 ENCOUNTER — Other Ambulatory Visit (HOSPITAL_COMMUNITY): Payer: Self-pay

## 2020-09-14 MED FILL — Canagliflozin Tab 100 MG: ORAL | 30 days supply | Qty: 30 | Fill #0 | Status: AC

## 2020-10-20 ENCOUNTER — Other Ambulatory Visit (HOSPITAL_COMMUNITY): Payer: Self-pay

## 2020-10-20 MED FILL — Canagliflozin Tab 100 MG: ORAL | 30 days supply | Qty: 30 | Fill #1 | Status: AC

## 2020-10-21 ENCOUNTER — Other Ambulatory Visit (HOSPITAL_COMMUNITY): Payer: Self-pay

## 2020-11-15 ENCOUNTER — Encounter: Payer: Self-pay | Admitting: *Deleted

## 2020-11-24 ENCOUNTER — Other Ambulatory Visit (HOSPITAL_COMMUNITY): Payer: Self-pay

## 2020-11-24 MED FILL — Canagliflozin Tab 100 MG: ORAL | 30 days supply | Qty: 30 | Fill #2 | Status: AC

## 2020-12-24 ENCOUNTER — Other Ambulatory Visit (HOSPITAL_COMMUNITY): Payer: Self-pay

## 2020-12-24 MED FILL — Canagliflozin Tab 100 MG: ORAL | 30 days supply | Qty: 30 | Fill #3 | Status: AC

## 2020-12-24 MED FILL — Metformin HCl Tab 500 MG: ORAL | 30 days supply | Qty: 60 | Fill #0 | Status: AC

## 2020-12-28 NOTE — Assessment & Plan Note (Addendum)
Current medications: Benazepril 20 mg Patient denies adverse reactions. Blood pressure is 123/77 Plan -Bmp -continue Benazepril 20 mg

## 2020-12-29 ENCOUNTER — Other Ambulatory Visit (HOSPITAL_COMMUNITY): Payer: Self-pay

## 2020-12-29 NOTE — Assessment & Plan Note (Signed)
>>  ASSESSMENT AND PLAN FOR DIABETES MELLITUS TYPE 2, INSULIN  DEPENDENT (HCC) WRITTEN ON 02/03/2021  9:30 AM BY MASTERS, KATIE, DO  Medications: Metformin  500 mg, Invokana  100mg ,   Last A1C: Increased to 10.5 from 8.7  Patient states that his AM blood sugar before eating is in the upper 160s usually.  Patient complains of polyphagia and unsteadiness on his feet that he said was not from dizziness.  I think this could be related to elevated blood sugars.  Hopefully this will improve as we work towards getting his BS under control.  Foot exam showed bilateral pulses brisk and full.  Vibratory senses within normal limites in S1 and L5. A/P: -repeat A1C was increased to 10.5 -increased metformin  to 1000mg  BID, will follow up in 3 months to add additional changes.  ADDENDUM 02/03/21: Notified by pharmacy that his insurance, [employer], does not cover canagliflozin .  Switched patient to dapagliflozin  5 mg.

## 2020-12-29 NOTE — Assessment & Plan Note (Addendum)
Medications: Metformin 500 mg, Invokana 100mg ,   Last A1C: Increased to 10.5 from 8.7  Patient states that his AM blood sugar before eating is in the upper 160s usually.  Patient complains of polyphagia and unsteadiness on his feet that he said was not from dizziness.  I think this could be related to elevated blood sugars.  Hopefully this will improve as we work towards getting his BS under control.  Foot exam showed bilateral pulses brisk and full.  Vibratory senses within normal limites in S1 and L5. A/P: -repeat A1C was increased to 10.5 -increased metformin to 1000mg  BID, will follow up in 3 months to add additional changes.  ADDENDUM 02/03/21: Notified by pharmacy that his insurance, [employer], does not cover canagliflozin.  Switched patient to dapagliflozin 5 mg.

## 2020-12-29 NOTE — Assessment & Plan Note (Addendum)
Medications: Crestor 20 mg  Last Cholesterol: 274  LDL: 189 Patient discontinued this medication due to muscle pain.  States he has been off of it for about a year. A/P: -repeat Lipid panel.  Will consider adding medication as appropriate passed on lab results.  ADDENDUM:  Cholesterol=267 LDL=190 Patient has been on multiple statin and experienced muscle pain.   -Started Zetia 10 mg

## 2020-12-30 ENCOUNTER — Ambulatory Visit (INDEPENDENT_AMBULATORY_CARE_PROVIDER_SITE_OTHER): Payer: 59 | Admitting: Internal Medicine

## 2020-12-30 ENCOUNTER — Other Ambulatory Visit (HOSPITAL_COMMUNITY): Payer: Self-pay

## 2020-12-30 ENCOUNTER — Encounter: Payer: Self-pay | Admitting: Internal Medicine

## 2020-12-30 DIAGNOSIS — E119 Type 2 diabetes mellitus without complications: Secondary | ICD-10-CM | POA: Diagnosis not present

## 2020-12-30 DIAGNOSIS — I10 Essential (primary) hypertension: Secondary | ICD-10-CM | POA: Diagnosis not present

## 2020-12-30 DIAGNOSIS — Z23 Encounter for immunization: Secondary | ICD-10-CM | POA: Diagnosis not present

## 2020-12-30 DIAGNOSIS — E785 Hyperlipidemia, unspecified: Secondary | ICD-10-CM | POA: Diagnosis not present

## 2020-12-30 DIAGNOSIS — Z0000 Encounter for general adult medical examination without abnormal findings: Secondary | ICD-10-CM

## 2020-12-30 DIAGNOSIS — Z794 Long term (current) use of insulin: Secondary | ICD-10-CM | POA: Diagnosis not present

## 2020-12-30 LAB — POCT GLYCOSYLATED HEMOGLOBIN (HGB A1C): Hemoglobin A1C: 10.5 % — AB (ref 4.0–5.6)

## 2020-12-30 LAB — GLUCOSE, CAPILLARY: Glucose-Capillary: 238 mg/dL — ABNORMAL HIGH (ref 70–99)

## 2020-12-30 MED ORDER — METFORMIN HCL 500 MG PO TABS
1000.0000 mg | ORAL_TABLET | Freq: Two times a day (BID) | ORAL | 3 refills | Status: DC
  Filled 2020-12-30: qty 60, 15d supply, fill #0

## 2020-12-30 MED ORDER — CANAGLIFLOZIN 100 MG PO TABS
100.0000 mg | ORAL_TABLET | Freq: Every day | ORAL | 2 refills | Status: DC
  Filled 2020-12-30 – 2021-02-03 (×3): qty 90, 90d supply, fill #0

## 2020-12-30 MED ORDER — BENAZEPRIL HCL 20 MG PO TABS: 20.0000 mg | ORAL_TABLET | Freq: Every day | ORAL | 11 refills | Status: DC

## 2020-12-30 MED ORDER — METFORMIN HCL 500 MG PO TABS
1000.0000 mg | ORAL_TABLET | Freq: Two times a day (BID) | ORAL | 3 refills | Status: DC
Start: 2020-12-30 — End: 2021-01-22
  Filled 2020-12-30 – 2021-01-22 (×2): qty 60, 15d supply, fill #0

## 2020-12-30 MED ORDER — CANAGLIFLOZIN 100 MG PO TABS
100.0000 mg | ORAL_TABLET | Freq: Every day | ORAL | 2 refills | Status: DC
  Filled 2020-12-30: qty 90, 90d supply, fill #0
  Filled 2020-12-30: qty 90, fill #0

## 2020-12-30 NOTE — Patient Instructions (Signed)
Carlos [name], it was a pleasure seeing you today!  Today we discussed: Diabetes- Your A1c was elevated at 10.5 today.  We are increasing your metformin to 1,000 mg (take two tablets at a time), twice a day.  We will follow up with you in three months to check your A1c again.  The changes we are making today will most likely not completely fix your high blood sugar, so it is very important that you follow back up with [place].  I have ordered the following labs today:   Lab Orders  Glucose, capillary  BMP8+Anion Gap  Lipid Profile  POC Hbg A1C    Tests ordered today:  Lipid profile BMP A1c= 10.5  Referrals ordered today:   Referral Orders  No referral(s) requested today     I have ordered the following medication/changed the following medications:   Stop the following medications: Medications Discontinued During This Encounter  Medication Reason   benazepril-hydrochlorthiazide (LOTENSIN HCT) 20-12.5 MG tablet Patient Preference   rosuvastatin (CRESTOR) 20 MG tablet Patient Preference   canagliflozin (INVOKANA) 100 MG TABS tablet Reorder   metFORMIN (GLUCOPHAGE) 500 MG tablet      Start the following medications: Meds ordered this encounter  Medications   benazepril (LOTENSIN) 20 MG tablet    Sig: Take 1 tablet (20 mg total) by mouth daily.    Dispense:  30 tablet    Refill:  11   canagliflozin (INVOKANA) 100 MG TABS tablet    Sig: TAKE 1 TABLET (100 MG TOTAL) BY MOUTH DAILY BEFORE BREAKFAST.    Dispense:  90 tablet    Refill:  2   metFORMIN (GLUCOPHAGE) 500 MG tablet    Sig: Take 2 tablets (1,000 mg total) by mouth 2 (two) times daily with a meal.    Dispense:  60 tablet    Refill:  3    IM per rx 30     Follow-up: 3 months   Please make sure to arrive 15 minutes prior to your next appointment. If you arrive late, you may be asked to reschedule.   We look forward to seeing you next time. Please call our clinic at [phone] if you have any questions or  concerns. The best time to call is Monday-Friday from 9am-4pm, but there is someone available 24/7. If after hours or the weekend, call the main hospital number and ask for the Internal Medicine Resident On-Call. If you need medication refills, please notify your pharmacy one week in advance and they will send [place] a request.  Thank you for letting us take part in your care. Wishing you the best!  Thank you, Dr. Sloan Leiter

## 2020-12-30 NOTE — Progress Notes (Signed)
   CC: f/u for uncontrolled DM  HPI:  [name] is a 45 y.o. with medical history as below presenting to [facility] for follow-up for his diabetes.    Please see problem-based list for further details, assessments, and plans.  Past Medical History:  Diagnosis Date   Diabetes mellitus 2002   Hypertension    Review of Systems:  As per HPI  Physical Exam:  There were no vitals filed for this visit.  General: well-developed, well-nourished HENT: NCAT, no scars Eyes: no scleral icterus, conjunctiva clear CV: normal rate, no murmurs Pulm: CTAB, pulmonary effort normal GI: no tenderness, bowel sounds normal MSK: normal range of motion Skin: no scars or bruising Neuro: normal gait, sensation intact in lower extremities Psych: normal mood and affect  Assessment & Plan:   See Encounters Tab for problem based charting.  Patient seen with Dr.  Mayford Knife

## 2020-12-31 LAB — BMP8+ANION GAP
Anion Gap: 15 mmol/L (ref 10.0–18.0)
BUN/Creatinine Ratio: 16 (ref 9–20)
BUN: 15 mg/dL (ref 6–24)
CO2: 24 mmol/L (ref 20–29)
Calcium: 9.7 mg/dL (ref 8.7–10.2)
Chloride: 102 mmol/L (ref 96–106)
Creatinine, Ser: 0.94 mg/dL (ref 0.76–1.27)
Glucose: 234 mg/dL — ABNORMAL HIGH (ref 65–99)
Potassium: 4.2 mmol/L (ref 3.5–5.2)
Sodium: 141 mmol/L (ref 134–144)
eGFR: 101 mL/min/{1.73_m2} (ref >59)

## 2020-12-31 LAB — LIPID PANEL
Chol/HDL Ratio: 3.9 ratio (ref 0.0–5.0)
Cholesterol, Total: 267 mg/dL — ABNORMAL HIGH (ref 100–199)
HDL: 68 mg/dL (ref >39)
LDL Chol Calc (NIH): 190 mg/dL — ABNORMAL HIGH (ref 0–99)
Triglycerides: 57 mg/dL (ref 0–149)
VLDL Cholesterol Cal: 9 mg/dL (ref 5–40)

## 2020-12-31 NOTE — Assessment & Plan Note (Signed)
-  TDAP vaccination 12/30/20 -We discussed covid vaccination and patient states he will think about it.

## 2021-01-01 ENCOUNTER — Other Ambulatory Visit (HOSPITAL_COMMUNITY): Payer: Self-pay

## 2021-01-01 MED ORDER — ROSUVASTATIN CALCIUM 5 MG PO TABS
5.0000 mg | ORAL_TABLET | Freq: Every day | ORAL | 11 refills | Status: DC
  Filled 2021-01-01: qty 30, 30d supply, fill #0

## 2021-01-01 MED ORDER — EZETIMIBE 10 MG PO TABS
10.0000 mg | ORAL_TABLET | Freq: Every day | ORAL | 11 refills | Status: DC
Start: 2021-01-01 — End: 2021-06-15
  Filled 2021-01-01: qty 30, 30d supply, fill #0
  Filled 2021-01-22: qty 1, 1d supply, fill #0
  Filled 2021-01-22: qty 29, 29d supply, fill #0
  Filled 2021-03-10: qty 30, 30d supply, fill #1
  Filled 2021-04-08: qty 30, 30d supply, fill #2
  Filled 2021-05-11: qty 30, 30d supply, fill #3
  Filled 2021-06-15 (×3): qty 30, 30d supply, fill #4

## 2021-01-01 NOTE — Addendum Note (Signed)
Addended by: Lucille Passy on: 01/01/2021 09:06 AM   Modules accepted: Orders

## 2021-01-01 NOTE — Addendum Note (Signed)
Addended by: Lucille Passy on: 01/01/2021 11:11 AM   Modules accepted: Orders

## 2021-01-11 ENCOUNTER — Other Ambulatory Visit (HOSPITAL_COMMUNITY): Payer: Self-pay

## 2021-01-22 ENCOUNTER — Other Ambulatory Visit (HOSPITAL_COMMUNITY): Payer: Self-pay

## 2021-01-22 MED ORDER — METFORMIN HCL 1000 MG PO TABS
1000.0000 mg | ORAL_TABLET | Freq: Two times a day (BID) | ORAL | 2 refills | Status: DC
  Filled 2021-01-22: qty 180, 90d supply, fill #0

## 2021-01-22 NOTE — Addendum Note (Signed)
Addended by: Lucille Passy on: 01/22/2021 10:44 AM   Modules accepted: Orders

## 2021-02-03 ENCOUNTER — Other Ambulatory Visit (HOSPITAL_COMMUNITY): Payer: Self-pay

## 2021-02-03 MED ORDER — DAPAGLIFLOZIN PROPANEDIOL 5 MG PO TABS
5.0000 mg | ORAL_TABLET | Freq: Every day | ORAL | 2 refills | Status: DC
  Filled 2021-02-03: qty 30, 30d supply, fill #0
  Filled 2021-03-10: qty 30, 30d supply, fill #1
  Filled 2021-04-08: qty 30, 30d supply, fill #2

## 2021-02-03 NOTE — Addendum Note (Signed)
Addended by: Lucille Passy on: 02/03/2021 09:32 AM   Modules accepted: Orders

## 2021-03-10 ENCOUNTER — Other Ambulatory Visit (HOSPITAL_COMMUNITY): Payer: Self-pay

## 2021-03-25 ENCOUNTER — Other Ambulatory Visit: Payer: Self-pay | Admitting: Internal Medicine

## 2021-03-25 DIAGNOSIS — I10 Essential (primary) hypertension: Secondary | ICD-10-CM

## 2021-03-25 NOTE — Telephone Encounter (Signed)
Rx was sent as "No Print" on 7/20.

## 2021-04-07 ENCOUNTER — Emergency Department (HOSPITAL_BASED_OUTPATIENT_CLINIC_OR_DEPARTMENT_OTHER)
Admission: EM | Admit: 2021-04-07 | Discharge: 2021-04-07 | Disposition: A | Discharge status: Home / Self Care | Payer: 59 | Attending: Emergency Medicine | Admitting: Emergency Medicine

## 2021-04-07 ENCOUNTER — Other Ambulatory Visit: Payer: Self-pay

## 2021-04-07 ENCOUNTER — Encounter (HOSPITAL_BASED_OUTPATIENT_CLINIC_OR_DEPARTMENT_OTHER): Payer: Self-pay | Admitting: *Deleted

## 2021-04-07 DIAGNOSIS — E119 Type 2 diabetes mellitus without complications: Secondary | ICD-10-CM | POA: Insufficient documentation

## 2021-04-07 DIAGNOSIS — R112 Nausea with vomiting, unspecified: Secondary | ICD-10-CM

## 2021-04-07 DIAGNOSIS — Z7984 Long term (current) use of oral hypoglycemic drugs: Secondary | ICD-10-CM | POA: Diagnosis not present

## 2021-04-07 DIAGNOSIS — D72829 Elevated white blood cell count, unspecified: Secondary | ICD-10-CM | POA: Insufficient documentation

## 2021-04-07 DIAGNOSIS — Z87891 Personal history of nicotine dependence: Secondary | ICD-10-CM | POA: Diagnosis not present

## 2021-04-07 DIAGNOSIS — I10 Essential (primary) hypertension: Secondary | ICD-10-CM | POA: Insufficient documentation

## 2021-04-07 LAB — COMPREHENSIVE METABOLIC PANEL
ALT: 24 U/L (ref 0–44)
AST: 21 U/L (ref 15–41)
Albumin: 4.6 g/dL (ref 3.5–5.0)
Alkaline Phosphatase: 62 U/L (ref 38–126)
Anion gap: 13 (ref 5–15)
BUN: 13 mg/dL (ref 6–20)
CO2: 26 mmol/L (ref 22–32)
Calcium: 9.6 mg/dL (ref 8.9–10.3)
Chloride: 98 mmol/L (ref 98–111)
Creatinine, Ser: 0.84 mg/dL (ref 0.61–1.24)
GFR, Estimated: >60 mL/min (ref >60)
Glucose, Bld: 181 mg/dL — ABNORMAL HIGH (ref 70–99)
Potassium: 4.2 mmol/L (ref 3.5–5.1)
Sodium: 137 mmol/L (ref 135–145)
Total Bilirubin: 1 mg/dL (ref 0.3–1.2)
Total Protein: 8 g/dL (ref 6.5–8.1)

## 2021-04-07 LAB — CBC WITH DIFFERENTIAL/PLATELET
Abs Immature Granulocytes: 0.06 10*3/uL (ref 0.00–0.07)
Basophils Absolute: 0 10*3/uL (ref 0.0–0.1)
Basophils Relative: 0 %
Eosinophils Absolute: 0 10*3/uL (ref 0.0–0.5)
Eosinophils Relative: 0 %
HCT: 43.3 % (ref 39.0–52.0)
Hemoglobin: 15.1 g/dL (ref 13.0–17.0)
Immature Granulocytes: 1 %
Lymphocytes Relative: 10 %
Lymphs Abs: 0.9 10*3/uL (ref 0.7–4.0)
MCH: 33.3 pg (ref 26.0–34.0)
MCHC: 34.9 g/dL (ref 30.0–36.0)
MCV: 95.6 fL (ref 80.0–100.0)
Monocytes Absolute: 0.3 10*3/uL (ref 0.1–1.0)
Monocytes Relative: 4 %
Neutro Abs: 7.3 10*3/uL (ref 1.7–7.7)
Neutrophils Relative %: 85 %
Platelets: 285 10*3/uL (ref 150–400)
RBC: 4.53 MIL/uL (ref 4.22–5.81)
RDW: 12.5 % (ref 11.5–15.5)
WBC: 8.6 10*3/uL (ref 4.0–10.5)
nRBC: 0 % (ref 0.0–0.2)

## 2021-04-07 MED ORDER — ONDANSETRON 4 MG PO TBDP
ORAL_TABLET | ORAL | Status: AC
  Administered 2021-04-07: 4 mg via ORAL
  Filled 2021-04-07: qty 1

## 2021-04-07 MED ORDER — ONDANSETRON 4 MG PO TBDP: 4.0000 mg | ORAL_TABLET | Freq: Once | ORAL | Status: AC

## 2021-04-07 MED ORDER — ONDANSETRON 4 MG PO TBDP: 4.0000 mg | ORAL_TABLET | Freq: Three times a day (TID) | ORAL | 0 refills | Status: DC | PRN

## 2021-04-07 NOTE — ED Provider Notes (Signed)
[facility] EMERGENCY DEPARTMENT Provider Note   CSN: [id number] Arrival date & time: 04/07/21  1635     History Chief Complaint  Patient presents with   Vomiting    Carlos [name] is a 45 y.o. male with past medical history significant for diabetes, hypertension who presents for evaluation of emesis.  Patient states he woke up this morning feeling nauseous.  Had approximately 7 episodes of NBNB emesis.  He is diabetic, not on insulin. Was not able to keep down his home meds. He denies any fever, chills, chest pain, cough, shortness of breath, abdominal pain, back pain, paresthesias, weakness, urinary symptoms, leg swelling, pain.  has not taken anything for symptoms at home.  No recent sick contacts.  Denies additional aggravating or relieving factors.  History obtained from patient, significant other in room and past medical records.   No interpreter is used.  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   Constipation 01/05/2018   Vision abnormalities 01/05/2018   Erectile dysfunction 05/10/2017   Diabetes mellitus type 2, insulin dependent (HCC) 01/02/2016   Hypertension, essential 01/02/2016   Tobacco abuse 01/02/2016   Onychomycosis due to dermatophyte 10/24/2012   Pain in joint, ankle and foot 10/24/2012    Past Surgical History:  Procedure Laterality Date   FEMUR FRACTURE SURGERY     right   INCISION AND DRAINAGE PERIRECTAL ABSCESS N/A 01/02/2016   Procedure: IRRIGATION AND DEBRIDEMENT PERIRECTAL ABSCESS;  Surgeon: Axel Filler, MD;  Location: [facility] OR;  Service: General;  Laterality: N/A;       No family history on file.  Social History   Tobacco Use   Smoking status: Former    Packs/day: 0.10    Years: 10.00     Pack years: 1.00    Types: Cigars, Cigarettes    Quit date: 12/22/2015    Years since quitting: 5.2   Smokeless tobacco: Never   Tobacco comments:    Recently quit  Vaping Use   Vaping Use: Never used  Substance Use Topics   Alcohol use: Yes    Comment: OccAsional   Drug use: Yes    Types: Marijuana    Comment: Occasionally.    Home Medications Prior to Admission medications   Medication Sig Start Date End Date Taking? Authorizing Provider  ondansetron (ZOFRAN ODT) 4 MG disintegrating tablet Take 1 tablet (4 mg total) by mouth every 8 (eight) hours as needed for nausea or vomiting. 04/07/21  Yes Kelsen Celona A, PA-C  benazepril (LOTENSIN) 20 MG tablet Take 1 tablet (20 mg total) by mouth daily. 12/30/20 12/30/21  Masters, Katie, DO  Cinnamon 500 MG capsule Take 500 mg by mouth daily.    [provider]  cyclobenzaprine (FLEXERIL) 10 MG tablet Take 1 tablet (10 mg total) by mouth 2 (two) times daily as needed for muscle spasms. 08/06/19   Mesner, Barbara Cower, MD  dapagliflozin propanediol (FARXIGA) 5 MG TABS tablet Take 1 tablet (5 mg total) by mouth daily before breakfast. 02/03/21   Masters, Florentina Addison, DO  ezetimibe (ZETIA) 10 MG tablet Take 1 tablet (10 mg total) by mouth daily. 01/01/21   Masters, Katie, DO  glucose blood test strip The patient is insulin requiring, ICD 10 code E11.9. The patient tests 4  times per day. 09/16/16   Rivet, Iris Pert, MD  lidocaine (LIDODERM) 5 % Place 1 patch onto the skin daily. Remove & Discard patch within 12 hours or as directed by MD 05/22/20   Caccavale, Sophia, PA-C  metFORMIN (GLUCOPHAGE) 1000 MG tablet Take 1 tablet (1,000 mg total) by mouth 2 (two) times daily with a meal. 01/22/21   Masters, Katie, DO  methocarbamol (ROBAXIN) 500 MG tablet Take 1 tablet (500 mg total) by mouth at bedtime as needed for muscle spasms. 05/22/20   Caccavale, Sophia, PA-C  ondansetron (ZOFRAN) 4 MG tablet Take 1 tablet (4 mg total) by mouth every 8 (eight) hours as  needed. 08/24/17   Tegeler, Canary Brim, MD  predniSONE (STERAPRED UNI-PAK 21 TAB) 10 MG (21) TBPK tablet Take by mouth daily. Take 6 tabs by mouth daily  for 2 days, then 5 tabs for 2 days, then 4 tabs for 2 days, then 3 tabs for 2 days, 2 tabs for 2 days, then 1 tab by mouth daily for 2 days 05/22/20   Caccavale, Sophia, PA-C  sildenafil (VIAGRA) 50 MG tablet Take 1 tablet (50 mg total) by mouth daily as needed for erectile dysfunction. 02/28/20   Seawell, Jaimie A, DO    Allergies    Penicillins and Sitagliptin-metformin hcl  Review of Systems   Review of Systems  Constitutional: Negative.   HENT: Negative.    Respiratory: Negative.    Cardiovascular: Negative.   Gastrointestinal:  Positive for nausea and vomiting. Negative for abdominal distention, abdominal pain, anal bleeding, blood in stool, constipation, diarrhea and rectal pain.  Genitourinary: Negative.   Musculoskeletal: Negative.   Skin: Negative.   Neurological: Negative.   All other systems reviewed and are negative.  Physical Exam Updated Vital Signs BP (!) 182/98   Pulse 93   Temp 97.8 F (36.6 C) (Oral)   Resp 16   Ht 6\' 2"  (1.88 m)   Wt 77.1 kg   SpO2 100%   BMI 21.83 kg/m   Physical Exam Vitals and nursing note reviewed.  Constitutional:      General: He is not in acute distress.    Appearance: He is well-developed. He is not ill-appearing, toxic-appearing or diaphoretic.  HENT:     Head: Normocephalic and atraumatic.     Nose: Nose normal.     Mouth/Throat:     Mouth: Mucous membranes are moist.  Eyes:     Pupils: Pupils are equal, round, and reactive to light.  Cardiovascular:     Rate and Rhythm: Normal rate and regular rhythm.     Pulses: Normal pulses.          Radial pulses are 2+ on the right side and 2+ on the left side.     Heart sounds: Normal heart sounds.  Pulmonary:     Effort: Pulmonary effort is normal. No respiratory distress.     Breath sounds: Normal breath sounds.  Abdominal:      General: Bowel sounds are normal. There is no distension.     Palpations: Abdomen is soft. There is no mass.     Tenderness: There is no abdominal tenderness. There is no right CVA tenderness, left CVA tenderness or guarding.     Comments: Soft, non tender. No rebound or guarding  Musculoskeletal:        General: No swelling, tenderness or deformity. Normal range of motion.     Cervical back: Normal range of motion and neck supple.  Right lower leg: No edema.     Left lower leg: No edema.     Comments: Moves all 4 extremities without difficulty, compartments soft  Skin:    General: Skin is warm and dry.     Capillary Refill: Capillary refill takes less than 2 seconds.  Neurological:     General: No focal deficit present.     Mental Status: He is alert and oriented to person, place, and time.   ED Results / Procedures / Treatments   Labs (all labs ordered are listed, but only abnormal results are displayed) Labs Reviewed  COMPREHENSIVE METABOLIC PANEL - Abnormal; Notable for the following components:      Result Value   Glucose, Bld 181 (*)    All other components within normal limits  CBC WITH DIFFERENTIAL/PLATELET    EKG None  Radiology No results found.  Procedures Procedures   Medications Ordered in ED Medications  ondansetron (ZOFRAN-ODT) disintegrating tablet 4 mg (4 mg Oral Given 04/07/21 1655)    ED Course  I have reviewed the triage vital signs and the nursing notes.  Pertinent labs & imaging results that were available during my care of the patient were reviewed by me and considered in my medical decision making (see chart for details).  Here for evaluation of emesis earlier today.  He is afebrile, nonseptic, not ill-appearing. Emesis is NBNB.  No urinary symptoms.  No abdominal pain, back pain, chest pain.  No sick contacts.  He was given Zofran by triage nurse prior to my assessment.  By the time I had seen patient he had already had a ginger ale and  feels much improved.  He does not want any further work-up at this time including imaging or urine as he has no urinary symptoms.  Labs personally reviewed and interpreted:  CBC without leukocytosis Metabolic panel glucose 181, no anion gap, normal LFTs, no additional abnormality  Patient is mildly hypertensive however he states this is likely due to him unable to tolerate his home p.o. medication.  I offered medication here however he declines and states he will take his medication at home.  He has no chest pain, shortness of breath, back pain, headache, numbness, weakness.  I low suspicion for acute hypertensive urgency or emergency at this time.  He is requesting discharge home.  Given he appears well will DC home with a short course of Zofran.  Encourage patient if he develops any additional symptoms, persistent emesis to return here to emergency department for broaden work-up.  He is agreeable.  Patient does not meet the SIRS or Sepsis criteria.  On exam patient does not have a surgical abdomin and there are no peritoneal signs.  No indication of appendicitis, bowel obstruction, bowel perforation, cholecystitis, diverticulitis, AAA, dissection, atypical cardiac etiology of symptoms.  The patient has been appropriately medically screened and/or stabilized in the ED. I have low suspicion for any other emergent medical condition which would require further screening, evaluation or treatment in the ED or require inpatient management.  Patient is hemodynamically stable and in no acute distress.  Patient able to ambulate in department prior to ED.  Evaluation does not show acute pathology that would require ongoing or additional emergent interventions while in the emergency department or further inpatient treatment.  I have discussed the diagnosis with the patient and answered all questions.  Pain is been managed while in the emergency department and patient has no further complaints prior to  discharge.  Patient is comfortable  with plan discussed in room and is stable for discharge at this time.  I have discussed strict return precautions for returning to the emergency department.  Patient was encouraged to follow-up with PCP/specialist refer to at discharge.      MDM Rules/Calculators/A&P                            Final Clinical Impression(s) / ED Diagnoses Final diagnoses:  Nausea and vomiting, unspecified vomiting type    Rx / DC Orders ED Discharge Orders          Ordered    ondansetron (ZOFRAN ODT) 4 MG disintegrating tablet  Every 8 hours PRN        04/07/21 1900             Bethel Sirois A, PA-C 04/07/21 1909    Sloan Leiter, DO 04/07/21 1928

## 2021-04-07 NOTE — ED Triage Notes (Signed)
C/o n/v x 24 hrs denies abd pain

## 2021-04-07 NOTE — Discharge Instructions (Signed)
Use the zofran as prescribed  Return for new or worsening symptoms

## 2021-04-08 ENCOUNTER — Other Ambulatory Visit: Payer: Self-pay | Admitting: Internal Medicine

## 2021-04-08 ENCOUNTER — Other Ambulatory Visit (HOSPITAL_COMMUNITY): Payer: Self-pay

## 2021-04-08 DIAGNOSIS — I10 Essential (primary) hypertension: Secondary | ICD-10-CM

## 2021-04-08 MED ORDER — BENAZEPRIL HCL 20 MG PO TABS: 20.0000 mg | ORAL_TABLET | Freq: Every day | ORAL | 0 refills | Status: DC

## 2021-04-08 NOTE — Telephone Encounter (Signed)
benazepril (LOTENSIN) 20 MG tablet, REFILL REQUEST @ [id number] - [place], [place] - [address]

## 2021-04-12 ENCOUNTER — Encounter: Payer: 59 | Admitting: Internal Medicine

## 2021-04-13 ENCOUNTER — Ambulatory Visit (INDEPENDENT_AMBULATORY_CARE_PROVIDER_SITE_OTHER): Payer: 59 | Admitting: Internal Medicine

## 2021-04-13 VITALS — BP 121/72 | HR 84 | Temp 98.1°F | Wt 181.7 lb

## 2021-04-13 DIAGNOSIS — I10 Essential (primary) hypertension: Secondary | ICD-10-CM | POA: Diagnosis not present

## 2021-04-13 DIAGNOSIS — Z7984 Long term (current) use of oral hypoglycemic drugs: Secondary | ICD-10-CM

## 2021-04-13 DIAGNOSIS — Z794 Long term (current) use of insulin: Secondary | ICD-10-CM | POA: Diagnosis not present

## 2021-04-13 DIAGNOSIS — E785 Hyperlipidemia, unspecified: Secondary | ICD-10-CM | POA: Diagnosis not present

## 2021-04-13 DIAGNOSIS — E119 Type 2 diabetes mellitus without complications: Secondary | ICD-10-CM | POA: Diagnosis not present

## 2021-04-13 LAB — POCT GLYCOSYLATED HEMOGLOBIN (HGB A1C): Hemoglobin A1C: 11 % — AB (ref 4.0–5.6)

## 2021-04-13 LAB — GLUCOSE, CAPILLARY: Glucose-Capillary: 274 mg/dL — ABNORMAL HIGH (ref 70–99)

## 2021-04-13 MED ORDER — BENAZEPRIL HCL 20 MG PO TABS: 20.0000 mg | ORAL_TABLET | Freq: Every day | ORAL | 1 refills | Status: DC

## 2021-04-13 MED ORDER — METFORMIN HCL 500 MG PO TABS
500.0000 mg | ORAL_TABLET | Freq: Every day | ORAL | 2 refills | Status: DC
Start: 2021-04-13 — End: 2021-06-24

## 2021-04-13 MED ORDER — RYBELSUS 3 MG PO TABS
3.0000 mg | ORAL_TABLET | Freq: Every day | ORAL | 0 refills | Status: AC
Start: 2021-04-13 — End: 2021-05-13

## 2021-04-13 NOTE — Assessment & Plan Note (Addendum)
Patient presents for follow-up on type 2 diabetes mellitus.  He has not been checking his blood sugars because he does not like to stick his fingers.  Has previously been on insulin in the past to treat his diabetes.  He states that he has been adherent with medications over the last 3 months.  He states that he had difficulty with the higher dosage of metformin decreased to 500 mg twice daily that he was on before due to nausea and diarrhea.  He is currently in the process of buying a house and moving. He states that his life feels hectic and he has not been monitoring what he eats as closely.  A/P: HbA1c increased from 10.5->11 today.  With hemoglobin greater than 9, ideally would start patient on insulin to help decrease his average blood sugars. Discussed this with patient and he reiterates that he does not want to inject insulin, will start GLP-1 therapy with PO Semaglutide. Diabetic foot exam completed and patient does have significant calluses to medial surface of hallux bilaterally.   Medications: Metformin 500 g twice daily, Dapagliflozin 5 mg qd   CHANGE- PO Semaglutide 3 mg qd  -PO semaglutide 3 mg for 30 days  -Asked patient to follow-up via telehealth in 3 weeks to see if he is tolerating medication and then titrate to PO Semaglutide 7 mg. -Referral to podiatry -Referral to Ophthalmology -HbA1c repeated today

## 2021-04-13 NOTE — Progress Notes (Signed)
   CC: diabetes follow-up  HPI:  [name] is a 45 y.o. with medical history as below presenting to [facility] for follow-up on type 2 diabetes mellitus.  Please see problem-based list for further details, assessments, and plans.  Past Medical History:  Diagnosis Date   Diabetes mellitus 2002   Hypertension    Review of Systems: ROS negative except for what is noted on the assessment and plan.  Physical Exam:  Vitals:   04/13/21 1359  BP: 121/72  Pulse: 84  Temp: 98.1 F (36.7 C)  TempSrc: Oral  SpO2: 99%  Weight: 181 lb 11.2 oz (82.4 kg)   General: Well-developed, well-nourished, Alert HENT: NCAT Eyes: No scleral icterus, conjunctiva clear CV: No murmurs, normal rate Pulm: CTAB, normal pulmonary effort GI:No tenderness, soft, nondistended MSK: normal bulk and tone Skin: warm and dry Neuro: 2+ dorsalis pedis pulses bilaterally, vibratory and monofilament sensation of feet bilaterally within normal limits Psych: normal mood and affect  Assessment & Plan:   See Encounters Tab for problem based charting.  Patient seen with Dr. Oswaldo Done

## 2021-04-13 NOTE — Assessment & Plan Note (Signed)
Medications: Benazepril 20 mg  Patient is medication has been taking.  Blood pressure today at 129/72.  BMP completed in July, with creatinine 0.94, GFR 101, and Potassium of 4.2.  Plan: Continue Benazepril 20 mg

## 2021-04-13 NOTE — Assessment & Plan Note (Signed)
Last lipid panel completed in 12/2020.   Lipid Panel     Component Value Date/Time   CHOL 267 (H) 12/30/2020 1147   TRIG 57 12/30/2020 1147   HDL 68 12/30/2020 1147   CHOLHDL 3.9 12/30/2020 1147   LDLCALC 190 (H) 12/30/2020 1147   LABVLDL 9 12/30/2020 1147   A:  Patient with uncontrolled type 2 diabetes, ASCVD score of 19.7% with recommendations of high-intensity statin with goal of LDL < 100 for primary prevention.  He has been prescribed Crestor in the past, but was not able to be adherent with medication due to myalgias.  Patient was started on Zetia in 01/2021 and he states he has been taking that medication daily.   P: -Continue Zetia 10 mg -Consider rechecking lipid panel at next office visit

## 2021-04-13 NOTE — Assessment & Plan Note (Signed)
>>  ASSESSMENT AND PLAN FOR DIABETES MELLITUS TYPE 2, INSULIN  DEPENDENT (HCC) WRITTEN ON 04/13/2021  5:16 PM BY MASTERS, KATIE, DO  Patient presents for follow-up on type 2 diabetes mellitus.  He has not been checking his blood sugars because he does not like to stick his fingers.  Has previously been on insulin  in the past to treat his diabetes.  He states that he has been adherent with medications over the last 3 months.  He states that he had difficulty with the higher dosage of metformin  decreased to 500 mg twice daily that he was on before due to nausea and diarrhea.  He is currently in the process of buying a house and moving. He states that his life feels hectic and he has not been monitoring what he eats as closely.  A/P: HbA1c increased from 10.5->11 today.  With hemoglobin greater than 9, ideally would start patient on insulin  to help decrease his average blood sugars. Discussed this with patient and he reiterates that he does not want to inject insulin , will start GLP-1 therapy with PO Semaglutide . Diabetic foot exam completed and patient does have significant calluses to medial surface of hallux bilaterally.   Medications: Metformin  500 g twice daily, Dapagliflozin  5 mg qd   CHANGE- PO Semaglutide  3 mg qd  -PO semaglutide  3 mg for 30 days  -Asked patient to follow-up via telehealth in 3 weeks to see if he is tolerating medication and then titrate to PO Semaglutide  7 mg. -Referral to podiatry -Referral to Ophthalmology -HbA1c repeated today

## 2021-04-13 NOTE — Patient Instructions (Signed)
[name], it was a pleasure seeing you today!  Today we discussed: Diabetes- I am glad you came in today. Your A1c (blood test that shows average blood sugar over three months) has continued to be elevated.  This means your average blood sugar is much higher that we would want increasing your risk for short term and long term complications.  We are adding another medication today called Rybelsus for your diabetes. This will be taken once daily.  You will start on a low dose and take this for 30 days. I would like to see you again in 3 weeks so we can talk about if you are tolerating this medication and then increase the dose if you are.   Blood pressure- Your blood pressure was well controlled with Lotensin, please continue this medication.  High cholesterol- Please continue taking Zetia  I have ordered the following labs today:  Lab Orders         Glucose, capillary         POC Hbg A1C       Tests ordered today:  A1c  Referrals ordered today:   Referral Orders         Ambulatory referral to Podiatry         Ambulatory referral to Ophthalmology       I have ordered the following medication/changed the following medications:   Stop the following medications: Medications Discontinued During This Encounter  Medication Reason   metFORMIN (GLUCOPHAGE) 1000 MG tablet Change in therapy   predniSONE (STERAPRED UNI-PAK 21 TAB) 10 MG (21) TBPK tablet Completed Course   ondansetron (ZOFRAN ODT) 4 MG disintegrating tablet Completed Course   benazepril (LOTENSIN) 20 MG tablet      Start the following medications: Meds ordered this encounter  Medications   benazepril (LOTENSIN) 20 MG tablet    Sig: Take 1 tablet (20 mg total) by mouth daily.    Dispense:  90 tablet    Refill:  1   metFORMIN (GLUCOPHAGE) 500 MG tablet    Sig: Take 1 tablet (500 mg total) by mouth daily with breakfast.    Dispense:  90 tablet    Refill:  2   Semaglutide (RYBELSUS) 3 MG TABS    Sig: Take 3  mg by mouth daily.    Dispense:  30 tablet    Refill:  0     Follow-up:  3 weeks    Please make sure to arrive 15 minutes prior to your next appointment. If you arrive late, you may be asked to reschedule.   We look forward to seeing you next time. Please call our clinic at [phone] if you have any questions or concerns. The best time to call is Monday-Friday from 9am-4pm, but there is someone available 24/7. If after hours or the weekend, call the main hospital number and ask for the Internal Medicine Resident On-Call. If you need medication refills, please notify your pharmacy one week in advance and they will send [place] a request.  Thank you for letting us take part in your care. Wishing you the best!  Thank you, Dr. [facility]

## 2021-04-14 NOTE — Progress Notes (Signed)
Internal Medicine Clinic Attending  I saw and evaluated the patient.  I personally confirmed the key portions of the history and exam documented by Dr. Masters and I reviewed pertinent patient test results.  The assessment, diagnosis, and plan were formulated together and I agree with the documentation in the resident's note.  

## 2021-04-27 ENCOUNTER — Ambulatory Visit: Payer: Self-pay | Admitting: Podiatry

## 2021-05-11 ENCOUNTER — Other Ambulatory Visit: Payer: Self-pay | Admitting: Internal Medicine

## 2021-05-11 ENCOUNTER — Other Ambulatory Visit (HOSPITAL_COMMUNITY): Payer: Self-pay

## 2021-05-11 DIAGNOSIS — Z794 Long term (current) use of insulin: Secondary | ICD-10-CM

## 2021-05-11 DIAGNOSIS — E119 Type 2 diabetes mellitus without complications: Secondary | ICD-10-CM

## 2021-05-11 MED ORDER — DAPAGLIFLOZIN PROPANEDIOL 5 MG PO TABS
5.0000 mg | ORAL_TABLET | Freq: Every day | ORAL | 2 refills | Status: DC
  Filled 2021-05-11: qty 30, 30d supply, fill #0
  Filled 2021-06-15 (×3): qty 30, 30d supply, fill #1

## 2021-05-20 ENCOUNTER — Other Ambulatory Visit (HOSPITAL_COMMUNITY): Payer: Self-pay

## 2021-05-24 ENCOUNTER — Ambulatory Visit: Payer: Self-pay | Admitting: Podiatry

## 2021-05-31 ENCOUNTER — Ambulatory Visit: Payer: Self-pay | Admitting: Podiatry

## 2021-06-09 ENCOUNTER — Ambulatory Visit: Payer: Self-pay | Admitting: Podiatry

## 2021-06-15 ENCOUNTER — Other Ambulatory Visit: Payer: Self-pay

## 2021-06-15 ENCOUNTER — Other Ambulatory Visit: Payer: Self-pay | Admitting: Internal Medicine

## 2021-06-15 ENCOUNTER — Other Ambulatory Visit (HOSPITAL_COMMUNITY): Payer: Self-pay

## 2021-06-15 DIAGNOSIS — E785 Hyperlipidemia, unspecified: Secondary | ICD-10-CM

## 2021-06-15 DIAGNOSIS — E119 Type 2 diabetes mellitus without complications: Secondary | ICD-10-CM

## 2021-06-15 MED ORDER — EZETIMIBE 10 MG PO TABS
10.0000 mg | ORAL_TABLET | Freq: Every day | ORAL | 2 refills | Status: DC
Start: 2021-06-15 — End: 2022-05-10
  Filled 2021-06-15: qty 90, 90d supply, fill #0
  Filled 2021-10-01: qty 90, 90d supply, fill #1
  Filled 2021-12-02 – 2022-01-19 (×2): qty 90, 90d supply, fill #2

## 2021-06-15 MED ORDER — DAPAGLIFLOZIN PROPANEDIOL 5 MG PO TABS: 5.0000 mg | ORAL_TABLET | Freq: Every day | ORAL | 2 refills | Status: DC

## 2021-06-15 NOTE — Telephone Encounter (Addendum)
RTC to patient stated unable to get medications filled at [facility].  Patient stated had [facility] was changed to something else.  Has moved and did not get a letter saying his new insurance.  Call to [facility] patient can get his Mickle Plumb and Zetia through the IM Program.  Patient has been assigned to [employer] and needs to call the company at [phone] to give them information needed  to get authorizations.  Call from Pharmacy.  Zetia was approved for patient through [employer].  Mickle Plumb now has a $300 co pay with his new insurance.  Message to be sent to doctor to see if a change can be done and to check for patient assistance  and a PA if available.  Patient was informed that the Mickle Plumb will cost $300 with his new insurance and that the Zetia is covered with a $3 copay.

## 2021-06-15 NOTE — Telephone Encounter (Incomplete Revision)
RTC to patient stated unable to get medications filled at [facility].  Patient stated had [employer] [facility] was changed to something else.  Has moved and did not get a letter saying his new insurance.  Call to [facility] patient can get his Faxiga and Zetia through the IM Program.  Patient has been assigned to [employer] and needs to call the company at [phone] to give them information needed  to get authorizations.  Call from Pharmacy.  Zetia was approved for patient through [employer].  Faxiga now has a $300 co pay with his new insurance.  Message to be sent to doctor to see if a change can be done and to check for patient assistance  and a PA if available.  Patient was informed that the Faxiga will cost $300 with his new insurance and that the Zetia is covered with a $3 copay.   °

## 2021-06-15 NOTE — Progress Notes (Signed)
PO Semaglutide 3 mg, patient was unable to follow-up for possible titration of Semaglutide.  Called and spoke with patient. He states that he no longer has insurance and is out of several medications.  He was taking PO semaglutide 3 mg and is currently out, but this is not on the IM program list.  Last A1c in 11/22 at 11. Patient was not agreeable to injections with insulin at that time. He has tried combo pill Janumet in the past and did not tolerate this medications due to palpitations and does not wish to try it again. Patient states that he has plenty of metformin at this time. Asked patient to follow-up in clinic for recheck of A1c in March. Could consider titrating metformin dose at that time if patient amenable.  -Refilled Farxiga 5 mg to IM program at [facility] -Refilled Zetia 10 mg to IM program at [facility]

## 2021-06-15 NOTE — Telephone Encounter (Signed)
Pt states he no longer has insurance to get his medication. Please call pt back.

## 2021-06-16 ENCOUNTER — Other Ambulatory Visit (HOSPITAL_COMMUNITY): Payer: Self-pay

## 2021-06-23 ENCOUNTER — Other Ambulatory Visit (HOSPITAL_COMMUNITY): Payer: Self-pay

## 2021-06-23 ENCOUNTER — Other Ambulatory Visit: Payer: Self-pay

## 2021-06-23 DIAGNOSIS — E119 Type 2 diabetes mellitus without complications: Secondary | ICD-10-CM

## 2021-06-23 DIAGNOSIS — Z794 Long term (current) use of insulin: Secondary | ICD-10-CM

## 2021-06-23 MED ORDER — DAPAGLIFLOZIN PROPANEDIOL 5 MG PO TABS
5.0000 mg | ORAL_TABLET | Freq: Every day | ORAL | 2 refills | Status: DC
  Filled 2021-06-23: qty 30, 30d supply, fill #0

## 2021-06-23 NOTE — Telephone Encounter (Signed)
dapagliflozin propanediol (FARXIGA) 5 MG TABS tablet, REFILL REQUEST @ [facility].

## 2021-06-23 NOTE — Telephone Encounter (Signed)
Called and confirmed with [facility] that patient does not have insurance and can refill meds through IM program.

## 2021-06-24 ENCOUNTER — Other Ambulatory Visit: Payer: Self-pay | Admitting: Internal Medicine

## 2021-06-24 DIAGNOSIS — Z794 Long term (current) use of insulin: Secondary | ICD-10-CM

## 2021-06-24 DIAGNOSIS — E119 Type 2 diabetes mellitus without complications: Secondary | ICD-10-CM

## 2021-06-24 MED ORDER — METFORMIN HCL 500 MG PO TABS: 500.0000 mg | ORAL_TABLET | Freq: Every day | ORAL | 2 refills | Status: DC

## 2021-06-24 MED ORDER — EMPAGLIFLOZIN 10 MG PO TABS: 10.0000 mg | ORAL_TABLET | Freq: Every day | ORAL | 0 refills | Status: DC

## 2021-06-24 NOTE — Progress Notes (Signed)
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  Spoke with Jannette Spanner about patient assistance programs. Will have patient start Jardiance 10 mg and work with Ms. Hairston to apply for patient assistance.  Called and spoke with patient about coming to clinin to pick up Jardiance sample and get papers with Ms. Hairston. Also asked patient to make follow-up appointment for A1c check at end of January.  He is interested in trying Janumet again and will plan to discuss this at follow-up visit. I changed medication list to reflect d/c farxiga and starting Jardiance 10 mg. Patient states that he continues to take Metformin 500 mg BID.

## 2021-06-29 ENCOUNTER — Other Ambulatory Visit (HOSPITAL_COMMUNITY): Payer: Self-pay

## 2021-06-29 ENCOUNTER — Telehealth: Payer: Self-pay

## 2021-06-29 NOTE — Telephone Encounter (Signed)
Spoke with pt regarding medication samples for jardiance and patient assistance. Pt is aware he will have pap paperwork to sign when picking up samples.

## 2021-06-29 NOTE — Telephone Encounter (Signed)
Pt picked up 3 boxes of Jardiance and signed paperwork.

## 2021-07-12 ENCOUNTER — Encounter: Payer: Self-pay | Admitting: Internal Medicine

## 2021-07-13 NOTE — Progress Notes (Signed)
Received notification from [place] [employer]) regarding approval for [employer]. Patient assistance approved from 07/11/21 to 07/11/22.  NO AUTO REFILL - PT CAN REQUEST REFILLS USING RX INFO IN SHIPMENT SENT TO HOME  Phone: [phone]

## 2021-07-22 ENCOUNTER — Other Ambulatory Visit (HOSPITAL_COMMUNITY): Payer: Self-pay

## 2021-07-22 ENCOUNTER — Telehealth: Payer: Self-pay | Admitting: *Deleted

## 2021-07-22 NOTE — Telephone Encounter (Signed)
Carlos [name] sent a secure chat - stated she will call the pt to show him how to order his medication.

## 2021-07-22 NOTE — Telephone Encounter (Addendum)
Triage was asked to call pt. Pt stated he's out of Jardiance; the last to time he received samples.  I will ask out pharmacy team to f/u. Per chart, he is in the patient assistance program. Thanks

## 2021-07-22 NOTE — Telephone Encounter (Signed)
Spoke to pt regarding his Jardiance medication. Pt was just recently enrolled (07/11/21) into the program and has not possibly rec'd his order yet. I gave him [employer] phone number to f/u on his shipment. Also informed pt he will be responsible for future refills once his shipment is rec'd.

## 2021-08-11 ENCOUNTER — Other Ambulatory Visit: Payer: Self-pay

## 2021-08-11 ENCOUNTER — Encounter: Payer: Self-pay | Admitting: Internal Medicine

## 2021-08-11 ENCOUNTER — Other Ambulatory Visit (HOSPITAL_COMMUNITY): Payer: Self-pay

## 2021-08-11 ENCOUNTER — Ambulatory Visit (INDEPENDENT_AMBULATORY_CARE_PROVIDER_SITE_OTHER): Payer: Self-pay | Admitting: Internal Medicine

## 2021-08-11 VITALS — BP 162/89 | HR 81 | Temp 98.7°F | Ht 74.0 in | Wt 186.5 lb

## 2021-08-11 DIAGNOSIS — E119 Type 2 diabetes mellitus without complications: Secondary | ICD-10-CM

## 2021-08-11 DIAGNOSIS — I10 Essential (primary) hypertension: Secondary | ICD-10-CM

## 2021-08-11 DIAGNOSIS — Z794 Long term (current) use of insulin: Secondary | ICD-10-CM

## 2021-08-11 LAB — POCT GLYCOSYLATED HEMOGLOBIN (HGB A1C): Hemoglobin A1C: 11 % — AB (ref 4.0–5.6)

## 2021-08-11 LAB — GLUCOSE, CAPILLARY: Glucose-Capillary: 249 mg/dL — ABNORMAL HIGH (ref 70–99)

## 2021-08-11 MED ORDER — EMPAGLIFLOZIN 10 MG PO TABS
10.0000 mg | ORAL_TABLET | Freq: Every day | ORAL | 0 refills | Status: DC
Start: 2021-08-11 — End: 2021-12-02
  Filled 2021-08-11 – 2021-12-02 (×2): qty 30, 30d supply, fill #0

## 2021-08-11 NOTE — Assessment & Plan Note (Addendum)
Patient presents with hyperglycemia, glucose level 300 on morning fasting.  Patient denies any symptoms at this time.  He states on prior office visit he was to resume taking metformin 500 mg twice daily; Jardiance 10 mg daily and Rybelsus 3 mg daily.  He was sent home with a few weeks worth of samples in which he completed.  Since then, he states he set up prescription mail order and never received his medications in the mail.  So for the last 2 months he has been without Jardiance and Rybelsus.  He does however endorse daily compliance with metformin.  A1c obtained today 11%, unchanged from 4 months ago.  Which was expected given patient has been without medication.  Patient denies any recent changes in diet.  Patient prefers to not be on daily insulin.  He previously was insulin-dependent over 3 years ago and is happy to report not using insulin any longer.  Prefers oral agents.  However, upon discussion, he may be open to weekly injections.  Patient was given Jardiance and Rybelsus samples today.  Carlos [name] is on the $4 this at [facility] health pharmacy and patient reports affordability, will pick it up today. ? ?PLAN: ?Give patient paperwork to fill out for orange card ?Continue current regimen: Metformin 500 mg twice daily; Jardiance 10 mg daily; Rybelsus 3 mg daily ?Reassess decision making upon weekly injection versus oral semaglutide ?Repeat hemoglobin A1c in 3 months ? ?

## 2021-08-11 NOTE — Assessment & Plan Note (Signed)
>>  ASSESSMENT AND PLAN FOR DIABETES MELLITUS TYPE 2, INSULIN  DEPENDENT (HCC) WRITTEN ON 08/11/2021  1:23 PM BY Tucker Gails, MD  Patient presents with hyperglycemia, glucose level 300 on morning fasting.  Patient denies any symptoms at this time.  He states on prior office visit he was to resume taking metformin  500 mg twice daily; Jardiance  10 mg daily and Rybelsus  3 mg daily.  He was sent home with a few weeks worth of samples in which he completed.  Since then, he states he set up prescription mail order and never received his medications in the mail.  So for the last 2 months he has been without Jardiance  and Rybelsus .  He does however endorse daily compliance with metformin .  A1c obtained today 11%, unchanged from 4 months ago.  Which was expected given patient has been without medication.  Patient denies any recent changes in diet.  Patient prefers to not be on daily insulin .  He previously was insulin -dependent over 3 years ago and is happy to report not using insulin  any longer.  Prefers oral agents.  However, upon discussion, he may be open to weekly injections.  Patient was given Jardiance  and Rybelsus  samples today.  Jardiance  is on the $4 this at [facility] health pharmacy and patient reports affordability, will pick it up today.  PLAN: Give patient paperwork to fill out for orange card Continue current regimen: Metformin  500 mg twice daily; Jardiance  10 mg daily; Rybelsus  3 mg daily Reassess decision making upon weekly injection versus oral semaglutide  Repeat hemoglobin A1c in 3 months

## 2021-08-11 NOTE — Progress Notes (Signed)
? ?CC: Hyperglycemia ? ?HPI: ? ?[name] is a 46 y.o. male with a past medical history stated below and presents today for cc stated above. Please see problem based assessment and plan for additional details. ? ?Past Medical History:  ?Diagnosis Date  ? Diabetes mellitus 2002  ? Hypertension   ? ? ?Current Outpatient Medications on File Prior to Visit  ?Medication Sig Dispense Refill  ? benazepril (LOTENSIN) 20 MG tablet Take 1 tablet (20 mg total) by mouth daily. 90 tablet 1  ? Cinnamon 500 MG capsule Take 500 mg by mouth daily.    ? cyclobenzaprine (FLEXERIL) 10 MG tablet Take 1 tablet (10 mg total) by mouth 2 (two) times daily as needed for muscle spasms. 30 tablet 0  ? empagliflozin (JARDIANCE) 10 MG TABS tablet Take 1 tablet (10 mg total) by mouth daily before breakfast. 15 tablet 0  ? ezetimibe (ZETIA) 10 MG tablet Take 1 tablet (10 mg total) by mouth daily. 90 tablet 2  ? glucose blood test strip The patient is insulin requiring, ICD 10 code E11.9. The patient tests 4 times per day. 100 each 12  ? lidocaine (LIDODERM) 5 % Place 1 patch onto the skin daily. Remove & Discard patch within 12 hours or as directed by MD 30 patch 0  ? metFORMIN (GLUCOPHAGE) 500 MG tablet Take 1 tablet (500 mg total) by mouth daily with breakfast. 90 tablet 2  ? methocarbamol (ROBAXIN) 500 MG tablet Take 1 tablet (500 mg total) by mouth at bedtime as needed for muscle spasms. 20 tablet 0  ? ondansetron (ZOFRAN) 4 MG tablet Take 1 tablet (4 mg total) by mouth every 8 (eight) hours as needed. 12 tablet 0  ? sildenafil (VIAGRA) 50 MG tablet Take 1 tablet (50 mg total) by mouth daily as needed for erectile dysfunction. 10 tablet 0  ? ?No current facility-administered medications on file prior to visit.  ? ? ?No family history on file. ? ?Social History  ? ?Socioeconomic History  ? Marital status: Married  ?  Spouse name: Not on file  ? Number of children: Not on file  ? Years of education: Not on file  ? Highest education  level: Not on file  ?Occupational History  ? Not on file  ?Tobacco Use  ? Smoking status: Former  ?  Packs/day: 0.10  ?  Years: 10.00  ?  Pack years: 1.00  ?  Types: Cigars, Cigarettes  ?  Quit date: 12/22/2015  ?  Years since quitting: 5.6  ? Smokeless tobacco: Never  ? Tobacco comments:  ?  Recently quit  ?Vaping Use  ? Vaping Use: Never used  ?Substance and Sexual Activity  ? Alcohol use: Yes  ?  Comment: OccAsional  ? Drug use: Yes  ?  Types: Marijuana  ?  Comment: Occasionally.  ? Sexual activity: Not on file  ?Other Topics Concern  ? Not on file  ?Social History Narrative  ? Not on file  ? ?Social Determinants of Health  ? ?Financial Resource Strain: Not on file  ?Food Insecurity: Not on file  ?Transportation Needs: Not on file  ?Physical Activity: Not on file  ?Stress: Not on file  ?Social Connections: Not on file  ?Intimate Partner Violence: Not on file  ? ? ?Review of Systems: ?ROS negative except for what is noted on the assessment and plan. ? ?There were no vitals filed for this visit. ? ? ?Physical Exam: ?Constitutional: well-appearing middle-aged gentleman sitting in the chair, in  no acute distress ?HENT: normocephalic atraumatic, mucous membranes moist ?Eyes: conjunctiva non-erythematous ?Neck: supple ?Cardiovascular: regular rate and rhythm, no m/r/g ?Pulmonary/Chest: normal work of breathing on room air, lungs clear to auscultation bilaterally ?Abdominal: soft, non-tender, non-distended ?MSK: normal bulk and tone ?Neurological: alert & oriented x 3, 5/5 strength in bilateral upper and lower extremities, normal gait ?Skin: warm and dry ?Psych: Normal behavior ? ? ?Assessment & Plan:  ? ?See Encounters Tab for problem based charting. ? ?Patient discussed with Dr. Sol Blazing ?Dellis Filbert, M.D. ?[facility] Internal Medicine, PGY-1 ?Pager: [phone], Phone: [phone] ?Date 08/11/2021 Time 10:56 AM ? ?

## 2021-08-11 NOTE — Patient Instructions (Signed)
Will be checking your A1c today ? ? ?Also, please begin taking the sample diabetic medications ? ?I will send Jardiance to [facility] health Pharmacy, it's on the 4$ list ?

## 2021-08-11 NOTE — Assessment & Plan Note (Signed)
Patient presented with elevated blood pressure on repeat check 162/89.  Patient's home medication includes benazepril 20 mg daily.  Patient states he is compliant with this medication.  Patient endorsed that he did smoke prior to his appointment.  Prior visit 4 months ago patient's blood pressure was at goal.  Patient denies any headache, chest pain, or shortness of breath.  Patient states he does not check his blood pressure at home often unless he is sick.  But states usually when he does it is normal.  When asked what is normal for him, unable to recall readings.  BMP obtained November 2022: Creatinine level 0.94; potassium 4.2 ? ?PLAN: ?On next visit consider increasing benazepril or adding second agent if blood pressure is elevated ?Continue benazepril 20 mg daily ?

## 2021-08-12 NOTE — Progress Notes (Signed)
Internal Medicine Clinic Attending ? ?Case discussed with Dr. Ruben Im  At the time of the visit.  We reviewed the resident?s history and exam and pertinent patient test results.  I agree with the assessment, diagnosis, and plan of care documented in the resident?s note. Victoza also on $4 list at [facility], however patient hesitant to start daily injections. Will continue to work on optimizing oral regimen. ?

## 2021-09-16 ENCOUNTER — Other Ambulatory Visit: Payer: Self-pay | Admitting: Internal Medicine

## 2021-10-01 ENCOUNTER — Other Ambulatory Visit (HOSPITAL_COMMUNITY): Payer: Self-pay

## 2021-10-01 ENCOUNTER — Other Ambulatory Visit: Payer: Self-pay | Admitting: Internal Medicine

## 2021-10-01 DIAGNOSIS — I10 Essential (primary) hypertension: Secondary | ICD-10-CM

## 2021-12-02 ENCOUNTER — Telehealth: Payer: Self-pay

## 2021-12-02 ENCOUNTER — Other Ambulatory Visit: Payer: Self-pay

## 2021-12-02 ENCOUNTER — Other Ambulatory Visit (HOSPITAL_COMMUNITY): Payer: Self-pay

## 2021-12-02 DIAGNOSIS — E119 Type 2 diabetes mellitus without complications: Secondary | ICD-10-CM

## 2021-12-02 NOTE — Telephone Encounter (Signed)
Pt is requesting a call back ... He stated that he did receive a few refills of his jardiance but he has not gotten any after that  he is wanting to know if it was to stop

## 2021-12-03 MED ORDER — EMPAGLIFLOZIN 10 MG PO TABS: 10.0000 mg | ORAL_TABLET | Freq: Every day | ORAL | 2 refills | Status: DC

## 2021-12-03 NOTE — Telephone Encounter (Signed)
Informed patient he is responsible for refills with this company and provided him the phone number to request his refill shipment - [phone] [facility])

## 2021-12-10 ENCOUNTER — Other Ambulatory Visit: Payer: Self-pay | Admitting: Internal Medicine

## 2021-12-10 ENCOUNTER — Other Ambulatory Visit: Payer: Self-pay

## 2021-12-10 ENCOUNTER — Telehealth: Payer: Self-pay

## 2021-12-10 DIAGNOSIS — E119 Type 2 diabetes mellitus without complications: Secondary | ICD-10-CM

## 2021-12-10 MED ORDER — EMPAGLIFLOZIN 10 MG PO TABS: 10.0000 mg | ORAL_TABLET | Freq: Every day | ORAL | 2 refills | Status: DC

## 2021-12-10 NOTE — Telephone Encounter (Signed)
ERROR

## 2021-12-10 NOTE — Telephone Encounter (Signed)
Needs prescription sent to [facility] . Patient will be getting medication for free.

## 2021-12-13 ENCOUNTER — Other Ambulatory Visit (HOSPITAL_COMMUNITY): Payer: Self-pay

## 2021-12-13 ENCOUNTER — Ambulatory Visit: Payer: Self-pay | Admitting: Internal Medicine

## 2021-12-13 ENCOUNTER — Encounter: Payer: Self-pay | Admitting: Internal Medicine

## 2021-12-13 ENCOUNTER — Other Ambulatory Visit: Payer: Self-pay

## 2021-12-13 VITALS — BP 132/79 | HR 87 | Temp 98.5°F | Ht 72.0 in | Wt 183.2 lb

## 2021-12-13 DIAGNOSIS — E785 Hyperlipidemia, unspecified: Secondary | ICD-10-CM

## 2021-12-13 DIAGNOSIS — G5601 Carpal tunnel syndrome, right upper limb: Secondary | ICD-10-CM

## 2021-12-13 DIAGNOSIS — Z7984 Long term (current) use of oral hypoglycemic drugs: Secondary | ICD-10-CM

## 2021-12-13 DIAGNOSIS — E119 Type 2 diabetes mellitus without complications: Secondary | ICD-10-CM

## 2021-12-13 HISTORY — DX: Carpal tunnel syndrome, right upper limb: G56.01

## 2021-12-13 LAB — POCT GLYCOSYLATED HEMOGLOBIN (HGB A1C): Hemoglobin A1C: 10.8 % — AB (ref 4.0–5.6)

## 2021-12-13 LAB — GLUCOSE, CAPILLARY: Glucose-Capillary: 373 mg/dL — ABNORMAL HIGH (ref 70–99)

## 2021-12-13 MED ORDER — RYBELSUS 3 MG PO TABS: 3.0000 mg | ORAL_TABLET | Freq: Every day | ORAL | 0 refills | Status: DC

## 2021-12-13 MED ORDER — METFORMIN HCL 500 MG PO TABS
1000.0000 mg | ORAL_TABLET | Freq: Two times a day (BID) | ORAL | 11 refills | Status: DC
  Filled 2021-12-13 (×2): qty 60, 15d supply, fill #0
  Filled 2022-05-31: qty 360, 90d supply, fill #0

## 2021-12-13 MED ORDER — EMPAGLIFLOZIN 10 MG PO TABS
10.0000 mg | ORAL_TABLET | Freq: Every day | ORAL | 2 refills | Status: DC
  Filled 2021-12-13: qty 90, 90d supply, fill #0

## 2021-12-13 NOTE — Assessment & Plan Note (Signed)
>>  ASSESSMENT AND PLAN FOR DIABETES MELLITUS TYPE 2, INSULIN  DEPENDENT (HCC) WRITTEN ON 12/16/2021 12:37 PM BY MASTERS, KATIE, DO  Patient presents for follow-up on diabetes management.  He states that he has been taking metformin  twice daily, Jardiance  once daily and Rybelsus .  On chart review, medication refill dates consistent with taking metformin , but not Jardiance  or Rybelsus .  Patient has previously been on insulin  and does not want to take any form of injection therapy at this time.  Repeat hemoglobin A1c went from 11 to 10.8. A/P: -Increase metformin  from 500 mg twice daily to 1000 mg twice daily.  I asked patient to call clinic if he is having GI symptoms and we could adjust his diabetes regimen. -Jardiance  10 mg refill sent -Rybelsus  3 mg 30 tabs given from sample -Follow-up in 2 weeks, would revisit weekly injections with Victoza  at that time if patient is amenable.

## 2021-12-13 NOTE — Progress Notes (Addendum)
Subjective:  CC: numbness, diabetes  HPI:  Carlos [name] is a 46 y.o. male with a past medical history stated below and presents today for numbness in right thumb and index finger and management of diabetes/ HLD. Please see problem based assessment and plan for additional details.  Past Medical History:  Diagnosis Date   Diabetes mellitus 2002   Hypertension     Current Outpatient Medications on File Prior to Visit  Medication Sig Dispense Refill   benazepril (LOTENSIN) 20 MG tablet TAKE 1 TABLET BY MOUTH EVERY DAY 90 tablet 2   Cinnamon 500 MG capsule Take 500 mg by mouth daily.     ezetimibe (ZETIA) 10 MG tablet Take 1 tablet (10 mg total) by mouth daily. 90 tablet 2   sildenafil (VIAGRA) 50 MG tablet Take 1 tablet (50 mg total) by mouth daily as needed for erectile dysfunction. 10 tablet 0   No current facility-administered medications on file prior to visit.    No family history on file.  Social History   Socioeconomic History   Marital status: Married    Spouse name: Not on file   Number of children: Not on file   Years of education: Not on file   Highest education level: Not on file  Occupational History   Not on file  Tobacco Use   Smoking status: Former    Packs/day: 0.10    Years: 10.00    Total pack years: 1.00    Types: Cigars, Cigarettes    Quit date: 12/22/2015    Years since quitting: 5.9   Smokeless tobacco: Never   Tobacco comments:    Recently quit  Vaping Use   Vaping Use: Never used  Substance and Sexual Activity   Alcohol use: Yes    Comment: OccAsional   Drug use: Yes    Types: Marijuana    Comment: Occasionally.   Sexual activity: Not on file  Other Topics Concern   Not on file  Social History Narrative   Not on file   Social Determinants of Health   Financial Resource Strain: Not on file  Food Insecurity: Not on file  Transportation Needs: Not on file  Physical Activity: Not on file  Stress: Not on file  Social  Connections: Not on file  Intimate Partner Violence: Not on file    Review of Systems: ROS negative except for what is noted on the assessment and plan.  Objective:   Vitals:   12/13/21 1523  BP: 132/79  Pulse: 87  Temp: 98.5 F (36.9 C)  TempSrc: Oral  SpO2: 100%  Weight: 183 lb 3.2 oz (83.1 kg)  Height: 6' (1.829 m)    Physical Exam: Constitutional: well-appearing, appears sleepy Eyes: sclera injected bilaterally Cardiovascular: regular rate and rhythm, no m/r/g Pulmonary/Chest: normal work of breathing on room air, lungs clear to auscultation bilaterally Skin: warm and dry Psych: appears content, smiling constantly     Assessment & Plan:  Diabetes mellitus type 2, insulin dependent (HCC) Patient presents for follow-up on diabetes management.  He states that he has been taking metformin twice daily, Jardiance once daily and Rybelsus.  On chart review, medication refill dates consistent with taking metformin, but not Jardiance or Rybelsus.  Patient has previously been on insulin and does not want to take any form of injection therapy at this time.  Repeat hemoglobin A1c went from 11 to 10.8. A/P: -Increase metformin from 500 mg twice daily to 1000 mg twice daily.  I asked  patient to call clinic if he is having GI symptoms and we could adjust his diabetes regimen. -Jardiance 10 mg refill sent -Rybelsus 3 mg 30 tabs given from sample -Follow-up in 2 weeks, would revisit weekly injections with Victoza at that time if patient is amenable.  Hyperlipidemia Patient with uncontrolled type 2 diabetes and ASCVD score of 19.7%.  He endorses taking Zetia and a refill history was consistent with this.  He previously has taken statin medications and did not tolerate this well due to muscle cramps. A/P: We will repeat lipid panel today.  ADDENDUM 12/16/21: I called and talked with patient about lipid panel.  LDL remains elevated at 100 while on Zetia.  He has previously tried statin  medications but is unsure which kind he was on before.  We talked about goal of decreasing risk for stroke and heart attack in the future.  He is open to trying pravastatin at a low dose. -Pravastatin 20 mg daily sent to [facility] outpatient under IM program  Carpal tunnel syndrome of right wrist Patient presenting with several weeks of intermittent numbness and tingling in his thumb and index finger of right hand.  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  Symptoms are also present at night and keep him awake.  On exam, grip strength is equal in hands bilaterally and muscles of hands appear symmetric. A/P: Patient instructed to try over-the-counter night splint for carpal tunnel syndrome.  He will follow-up in 4 weeks.  He currently does not have insurance and has been given orange packet in the past.  I encouraged him to complete paperwork.   Patient discussed with Dr. Hurshel Keys Zamier Eggebrecht, D.O. [facility] Internal Medicine  PGY-2 Pager: [phone]  Phone: [phone] Date 12/16/2021  Time 12:37 PM

## 2021-12-13 NOTE — Assessment & Plan Note (Signed)
Patient with uncontrolled type 2 diabetes and ASCVD score of 19.7%.  He endorses taking Zetia and a refill history was consistent with this.  He previously has taken statin medications and did not tolerate this well due to muscle cramps. A/P: We will repeat lipid panel today.  ADDENDUM 12/16/21: I called and talked with patient about lipid panel.  LDL remains elevated at 100 while on Zetia.  He has previously tried statin medications but is unsure which kind he was on before.  We talked about goal of decreasing risk for stroke and heart attack in the future.  He is open to trying pravastatin at a low dose. -Pravastatin 20 mg daily sent to [facility] outpatient under IM program

## 2021-12-13 NOTE — Assessment & Plan Note (Signed)
Patient presenting with several weeks of intermittent numbness and tingling in his thumb and index finger of right hand.  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  Symptoms are also present at night and keep him awake.  On exam, grip strength is equal in hands bilaterally and muscles of hands appear symmetric. A/P: Patient instructed to try over-the-counter night splint for carpal tunnel syndrome.  He will follow-up in 4 weeks.  He currently does not have insurance and has been given orange packet in the past.  I encouraged him to complete paperwork.

## 2021-12-13 NOTE — Patient Instructions (Signed)
Thank you, Mr.Husain Westberg for allowing [place] to provide your care today. Today we discussed:  Diabetes- Increase metformin from 500 mg to 1000mg . Take this 2 times daily. Continue taking Jardiance (I sent refills to [facility] outpatient pharmacy). I also gave you some samples of Rybelsus.  Cholesterol- I am rechecking cholesterol today. I will call you with results in a few days.  Carpel tunnel syndrome- Please consider getting an over the counter splint to wear at night    I have ordered the following labs for you:   Lab Orders         Lipid Profile         Glucose, capillary         POC Hbg A1C      Referrals ordered today:   Referral Orders  No referral(s) requested today     I have ordered the following medication/changed the following medications:   Stop the following medications: Medications Discontinued During This Encounter  Medication Reason   glucose blood test strip Discontinued by provider   lidocaine (LIDODERM) 5 % Completed Course   cyclobenzaprine (FLEXERIL) 10 MG tablet Completed Course   ondansetron (ZOFRAN) 4 MG tablet Completed Course   methocarbamol (ROBAXIN) 500 MG tablet Completed Course   empagliflozin (JARDIANCE) 10 MG TABS tablet Reorder     Start the following medications: Meds ordered this encounter  Medications   Semaglutide (RYBELSUS) 3 MG TABS    Sig: Take 3 mg by mouth daily.    Dispense:  30 tablet    Refill:  0   empagliflozin (JARDIANCE) 10 MG TABS tablet    Sig: Take 1 tablet (10 mg total) by mouth daily before breakfast.    Dispense:  90 tablet    Refill:  2     Follow up:  2 weeks  for carpel tunnel syndrome   We look forward to seeing you next time. Please call our clinic at [phone] if you have any questions or concerns. The best time to call is Monday-Friday from 9am-4pm, but there is someone available 24/7. If after hours or the weekend, call the main hospital number and ask for the Internal Medicine Resident On-Call. If  you need medication refills, please notify your pharmacy one week in advance and they will send [phone] a request.   Thank you for trusting me with your care. Wishing you the best!   [place], DO [facility]

## 2021-12-13 NOTE — Assessment & Plan Note (Addendum)
Patient presents for follow-up on diabetes management.  He states that he has been taking metformin twice daily, Jardiance once daily and Rybelsus.  On chart review, medication refill dates consistent with taking metformin, but not Jardiance or Rybelsus.  Patient has previously been on insulin and does not want to take any form of injection therapy at this time.  Repeat hemoglobin A1c went from 11 to 10.8. A/P: -Increase metformin from 500 mg twice daily to 1000 mg twice daily.  I asked patient to call clinic if he is having GI symptoms and we could adjust his diabetes regimen. -Jardiance 10 mg refill sent -Rybelsus 3 mg 30 tabs given from sample -Follow-up in 4 weeks, would revisit weekly injections with Victoza at that time if patient is amenable.

## 2021-12-14 LAB — LIPID PANEL
Chol/HDL Ratio: 3.4 ratio (ref 0.0–5.0)
Cholesterol, Total: 191 mg/dL (ref 100–199)
HDL: 56 mg/dL (ref >39)
LDL Chol Calc (NIH): 100 mg/dL — ABNORMAL HIGH (ref 0–99)
Triglycerides: 207 mg/dL — ABNORMAL HIGH (ref 0–149)
VLDL Cholesterol Cal: 35 mg/dL (ref 5–40)

## 2021-12-15 NOTE — Progress Notes (Signed)
Internal Medicine Clinic Attending  Case discussed with Dr. Sloan Leiter  At the time of the visit.  We reviewed the resident's history and exam and pertinent patient test results.  I agree with the assessment, diagnosis, and plan of care documented in the resident's note.     This is a tough situation. Patient has poorly controlled T2DM, but has struggled with adherence and is very resistant to any injections. I agree with Dr. Sloan Leiter' short-term plan today to 1) increase metformin and 2) provide him with jardiance and semaglutide and encourage adherence, with quick follow-up.  Given his elevated cholesterol and very high ASCVD risk, I think it would be a good idea to start a low-dose statin that he hasn't tried before.

## 2021-12-16 ENCOUNTER — Other Ambulatory Visit (HOSPITAL_COMMUNITY): Payer: Self-pay

## 2021-12-16 MED ORDER — PRAVASTATIN SODIUM 20 MG PO TABS
20.0000 mg | ORAL_TABLET | Freq: Every evening | ORAL | 11 refills | Status: DC
  Filled 2021-12-16: qty 30, 30d supply, fill #0

## 2021-12-16 MED ORDER — ROSUVASTATIN CALCIUM 5 MG PO TABS
5.0000 mg | ORAL_TABLET | Freq: Every day | ORAL | 11 refills | Status: DC
  Filled 2021-12-16: qty 30, 30d supply, fill #0

## 2021-12-16 NOTE — Addendum Note (Signed)
Addended by: Lucille Passy on: 12/16/2021 12:37 PM   Modules accepted: Orders

## 2022-01-19 ENCOUNTER — Other Ambulatory Visit (HOSPITAL_COMMUNITY): Payer: Self-pay

## 2022-02-02 ENCOUNTER — Other Ambulatory Visit: Payer: Self-pay

## 2022-02-02 ENCOUNTER — Emergency Department (HOSPITAL_BASED_OUTPATIENT_CLINIC_OR_DEPARTMENT_OTHER)
Admission: EM | Admit: 2022-02-02 | Discharge: 2022-02-03 | Disposition: A | Discharge status: Home / Self Care | Payer: Self-pay | Attending: Emergency Medicine | Admitting: Emergency Medicine

## 2022-02-02 ENCOUNTER — Encounter (HOSPITAL_BASED_OUTPATIENT_CLINIC_OR_DEPARTMENT_OTHER): Payer: Self-pay

## 2022-02-02 DIAGNOSIS — E1165 Type 2 diabetes mellitus with hyperglycemia: Secondary | ICD-10-CM | POA: Insufficient documentation

## 2022-02-02 DIAGNOSIS — Z87891 Personal history of nicotine dependence: Secondary | ICD-10-CM | POA: Insufficient documentation

## 2022-02-02 DIAGNOSIS — Z79899 Other long term (current) drug therapy: Secondary | ICD-10-CM | POA: Insufficient documentation

## 2022-02-02 DIAGNOSIS — Z7984 Long term (current) use of oral hypoglycemic drugs: Secondary | ICD-10-CM | POA: Insufficient documentation

## 2022-02-02 DIAGNOSIS — R739 Hyperglycemia, unspecified: Secondary | ICD-10-CM

## 2022-02-02 DIAGNOSIS — I10 Essential (primary) hypertension: Secondary | ICD-10-CM | POA: Insufficient documentation

## 2022-02-02 DIAGNOSIS — U071 COVID-19: Secondary | ICD-10-CM | POA: Insufficient documentation

## 2022-02-02 LAB — BASIC METABOLIC PANEL
Anion gap: 14 (ref 5–15)
BUN: 11 mg/dL (ref 6–20)
CO2: 24 mmol/L (ref 22–32)
Calcium: 9.6 mg/dL (ref 8.9–10.3)
Chloride: 99 mmol/L (ref 98–111)
Creatinine, Ser: 0.97 mg/dL (ref 0.61–1.24)
GFR, Estimated: >60 mL/min (ref >60)
Glucose, Bld: 191 mg/dL — ABNORMAL HIGH (ref 70–99)
Potassium: 4 mmol/L (ref 3.5–5.1)
Sodium: 137 mmol/L (ref 135–145)

## 2022-02-02 LAB — CBC
HCT: 42.4 % (ref 39.0–52.0)
Hemoglobin: 14.6 g/dL (ref 13.0–17.0)
MCH: 33 pg (ref 26.0–34.0)
MCHC: 34.4 g/dL (ref 30.0–36.0)
MCV: 95.9 fL (ref 80.0–100.0)
Platelets: 249 10*3/uL (ref 150–400)
RBC: 4.42 MIL/uL (ref 4.22–5.81)
RDW: 14 % (ref 11.5–15.5)
WBC: 6.4 10*3/uL (ref 4.0–10.5)
nRBC: 0 % (ref 0.0–0.2)

## 2022-02-02 LAB — URINALYSIS, ROUTINE W REFLEX MICROSCOPIC
Bilirubin Urine: NEGATIVE
Glucose, UA: >=500 mg/dL — AB
Hgb urine dipstick: NEGATIVE
Ketones, ur: 80 mg/dL — AB
Leukocytes,Ua: NEGATIVE
Nitrite: NEGATIVE
Protein, ur: NEGATIVE mg/dL
Specific Gravity, Urine: 1.02 (ref 1.005–1.030)
pH: 5 (ref 5.0–8.0)

## 2022-02-02 LAB — CBG MONITORING, ED: Glucose-Capillary: 187 mg/dL — ABNORMAL HIGH (ref 70–99)

## 2022-02-02 LAB — RESP PANEL BY RT-PCR (FLU A&B, COVID) ARPGX2
Influenza A by PCR: NEGATIVE
Influenza B by PCR: NEGATIVE
SARS Coronavirus 2 by RT PCR: POSITIVE — AB

## 2022-02-02 LAB — URINALYSIS, MICROSCOPIC (REFLEX)

## 2022-02-02 LAB — TROPONIN I (HIGH SENSITIVITY): Troponin I (High Sensitivity): 7 ng/L (ref <18)

## 2022-02-02 MED ORDER — ONDANSETRON HCL 4 MG/2ML IJ SOLN
4.0000 mg | Freq: Once | INTRAMUSCULAR | Status: AC
  Administered 2022-02-02: 4 mg via INTRAVENOUS
  Filled 2022-02-02: qty 2

## 2022-02-02 MED ORDER — ONDANSETRON HCL 4 MG PO TABS: 4.0000 mg | ORAL_TABLET | Freq: Four times a day (QID) | ORAL | 0 refills | Status: DC

## 2022-02-02 MED ORDER — LACTATED RINGERS IV BOLUS
1000.0000 mL | Freq: Once | INTRAVENOUS | Status: AC
  Administered 2022-02-02: 1000 mL via INTRAVENOUS

## 2022-02-02 NOTE — ED Triage Notes (Signed)
Pt reports blood sugar check at home was 260 and then 180. Reports he is diabetic and takes metformin at home; pt c/o nausea and vomiting.

## 2022-02-02 NOTE — ED Provider Notes (Signed)
[facility] EMERGENCY DEPARTMENT Provider Note   CSN: [id number] Arrival date & time: 02/02/22  1908     History  Chief Complaint  Patient presents with   Hyperglycemia    Pt reports blood sugar check at home was 260 and then 180. Reports he is diabetic; c/o nausea and vomiting.     Carlos [name] is a 46 y.o. male with history of type 2 diabetes, hypertension, tobacco use who presents emergency department for evaluation of elevated at home blood sugars as high as 260 and then 180.  Patient is accompanied by his wife who provides some of the history.  Reportedly, patient is typically on metformin.  2 days ago, they went to a health food store to pick up black seed oil and discontinued his metformin.  He has been taking the black seed oil in place of his metformin for 2 days, however when his blood sugars were elevated today they did give him his metformin.  Patient notes that he was feeling fine taking the black seed oil until last night when he developed nausea and vomiting.  He reportedly has been vomiting on and off throughout the day today.  They came to the emergency department for evaluation of his blood sugars reached as high as 260 in conjunction with nausea and vomiting.  He has no previous history of DKA.  He denies chest pain, shortness of breath, cough, congestion, fevers abdominal pain, diarrhea, hematuria, dysuria.  Hyperglycemia Associated symptoms: nausea and vomiting   Associated symptoms: no abdominal pain, no chest pain, no dysuria, no fever and no shortness of breath        Home Medications Prior to Admission medications   Medication Sig Start Date End Date Taking? Authorizing Provider  ondansetron (ZOFRAN) 4 MG tablet Take 1 tablet (4 mg total) by mouth every 6 (six) hours. 02/02/22  Yes Raynald Blend R, PA-C  benazepril (LOTENSIN) 20 MG tablet TAKE 1 TABLET BY MOUTH EVERY DAY 10/01/21   Masters, Katie, DO  Cinnamon 500 MG capsule Take 500 mg by mouth  daily.    [provider]  empagliflozin (JARDIANCE) 10 MG TABS tablet Take 1 tablet (10 mg total) by mouth daily before breakfast. 12/13/21   Masters, Florentina Addison, DO  ezetimibe (ZETIA) 10 MG tablet Take 1 tablet (10 mg total) by mouth daily. 06/15/21 06/15/22  Masters, Florentina Addison, DO  metFORMIN (GLUCOPHAGE) 500 MG tablet Take 2 tablets (1,000 mg total) by mouth 2 (two) times daily with a meal. 12/13/21 12/13/22  Masters, Katie, DO  pravastatin (PRAVACHOL) 20 MG tablet Take 1 tablet (20 mg total) by mouth every evening. 12/16/21 12/16/22  Masters, Katie, DO  Semaglutide (RYBELSUS) 3 MG TABS Take 3 mg by mouth daily. 12/13/21   Masters, Katie, DO  sildenafil (VIAGRA) 50 MG tablet Take 1 tablet (50 mg total) by mouth daily as needed for erectile dysfunction. 02/28/20   Seawell, Jaimie A, DO      Allergies    Penicillins and Sitagliptin-metformin hcl    Review of Systems   Review of Systems  Constitutional:  Negative for fever.  Respiratory:  Negative for shortness of breath.   Cardiovascular:  Negative for chest pain.  Gastrointestinal:  Positive for nausea and vomiting. Negative for abdominal pain and diarrhea.  Genitourinary:  Negative for dysuria.    Physical Exam Updated Vital Signs BP (!) 166/98   Pulse (!) 101   Temp 98.4 F (36.9 C) (Oral)   Resp (!) 22   Ht 6' (  1.829 m)   Wt 79.4 kg   SpO2 97%   BMI 23.73 kg/m  Physical Exam Vitals and nursing note reviewed.  Constitutional:      General: He is not in acute distress.    Appearance: He is not ill-appearing.  HENT:     Head: Atraumatic.     Nose: No congestion.     Mouth/Throat:     Mouth: Mucous membranes are moist.     Pharynx: Oropharynx is clear.  Eyes:     Conjunctiva/sclera: Conjunctivae normal.  Cardiovascular:     Rate and Rhythm: Regular rhythm. Tachycardia present.     Pulses: Normal pulses.     Heart sounds: No murmur heard. Pulmonary:     Effort: Pulmonary effort is normal. No respiratory distress.     Breath sounds:  Normal breath sounds.  Abdominal:     General: Abdomen is flat. There is no distension.     Palpations: Abdomen is soft.     Tenderness: There is no abdominal tenderness. There is no right CVA tenderness, left CVA tenderness or guarding.  Musculoskeletal:        General: Normal range of motion.     Cervical back: Normal range of motion.  Skin:    General: Skin is warm and dry.     Capillary Refill: Capillary refill takes less than 2 seconds.  Neurological:     General: No focal deficit present.     Mental Status: He is alert.  Psychiatric:        Mood and Affect: Mood normal.     ED Results / Procedures / Treatments   Labs (all labs ordered are listed, but only abnormal results are displayed) Labs Reviewed  RESP PANEL BY RT-PCR (FLU A&B, COVID) ARPGX2 - Abnormal; Notable for the following components:      Result Value   SARS Coronavirus 2 by RT PCR POSITIVE (*)    All other components within normal limits  BASIC METABOLIC PANEL - Abnormal; Notable for the following components:   Glucose, Bld 191 (*)    All other components within normal limits  URINALYSIS, ROUTINE W REFLEX MICROSCOPIC - Abnormal; Notable for the following components:   Glucose, UA >=500 (*)    Ketones, ur 80 (*)    All other components within normal limits  URINALYSIS, MICROSCOPIC (REFLEX) - Abnormal; Notable for the following components:   Bacteria, UA RARE (*)    All other components within normal limits  CBG MONITORING, ED - Abnormal; Notable for the following components:   Glucose-Capillary 187 (*)    All other components within normal limits  CBC  CBG MONITORING, ED  CBG MONITORING, ED  TROPONIN I (HIGH SENSITIVITY)  TROPONIN I (HIGH SENSITIVITY)    EKG None  Radiology No results found.  Procedures Procedures    Medications Ordered in ED Medications  lactated ringers bolus 1,000 mL (1,000 mLs Intravenous New Bag/Given 02/02/22 2218)  ondansetron (ZOFRAN) injection 4 mg (4 mg Intravenous  Given 02/02/22 2218)    ED Course/ Medical Decision Making/ A&P                           Medical Decision Making Amount and/or Complexity of Data Reviewed Labs: ordered.  Risk Prescription drug management.   Social determinants of health:  Social History   Socioeconomic History   Marital status: Married    Spouse name: Not on file   Number of children: Not on  file   Years of education: Not on file   Highest education level: Not on file  Occupational History   Not on file  Tobacco Use   Smoking status: Former    Packs/day: 0.10    Years: 10.00    Total pack years: 1.00    Types: Cigars, Cigarettes    Quit date: 12/22/2015    Years since quitting: 6.1   Smokeless tobacco: Never   Tobacco comments:    Recently quit  Vaping Use   Vaping Use: Never used  Substance and Sexual Activity   Alcohol use: Yes    Comment: OccAsional   Drug use: Yes    Types: Marijuana    Comment: Occasionally.   Sexual activity: Not on file  Other Topics Concern   Not on file  Social History Narrative   Not on file   Social Determinants of Health   Financial Resource Strain: Not on file  Food Insecurity: Not on file  Transportation Needs: Not on file  Physical Activity: Not on file  Stress: Not on file  Social Connections: Not on file  Intimate Partner Violence: Not on file     Initial impression:  This patient presents to the ED for concern of nausea and vomiting with elevated blood sugars, this involves an extensive number of treatment options, and is a complaint that carries with it a high risk of complications and morbidity.   Differentials include viral gastroenteritis, DKA, HHS, ACS, COVID, viral URI, sepsis.   Comorbidities affecting care:  Diabetes, HTN  Additional history obtained: wife  Lab Tests  I Ordered, reviewed, and interpreted labs and EKG.  The pertinent results include:  Glucose 191, Bicard and anion gap normal No leukocytosis  EKG: Sinus  rhythm  Cardiac Monitoring:  The patient was maintained on a cardiac monitor.  I personally viewed and interpreted the cardiac monitored which showed an underlying rhythm of: tachycardia   Medicines ordered and prescription drug management:  I ordered medication including: LR bolus Zofran 4mg  IV   Reevaluation of the patient after these medicines showed that the patient improved I have reviewed the patients home medicines and have made adjustments as needed   ED Course/Re-evaluation: Patient is ill-appearing although nontoxic.  Vitals significant for mild tachycardia to 104 although he is satting at 100% on room air.  Lung and heart sounds normal.  Abdomen is soft, nondistended and nontender to palpation.  Labs ordered in triage include BMP, CBC which were reassuring.  Bicarb and anion gap are normal and there is no evidence of DKA.  Glucose 191.  No leukocytosis.  I suspect that patient has underlying viral infection causing the nausea and vomiting, however out of abundance of caution I obtained respiratory panel, troponin and EKG.  Troponin was normal, however patient is COVID-positive.  Patient was given 1 L of fluids for tachycardia and rehydration along with Zofran 4 mg IV.  He was feeling slightly better after administration of fluids.  Will discharge home with prescription for Zofran.  Advised to increase p.o. water intake, continue with metformin and continue to monitor his glucose at home.  Follow-up with PCP as needed.  Patient and wife expressed understanding are amenable to plan.  Disposition:  After consideration of the diagnostic results, physical exam, history and the patients response to treatment feel that the patent would benefit from discharge.   COVID-19: Plan and management as described above. Discharged home in good condition.  Final Clinical Impression(s) / ED Diagnoses Final diagnoses:  COVID-19  Hyperglycemia    Rx / DC Orders ED Discharge Orders           Ordered    ondansetron (ZOFRAN) 4 MG tablet  Every 6 hours        02/02/22 2358              Rodena Piety 02/02/22 2358    Gareth Morgan, MD 02/03/22 [phone]

## 2022-02-02 NOTE — Discharge Instructions (Signed)
Your COVID test was positive today which is likely why you are feeling ill and having nausea and vomiting.  I would still recommend discontinue the black seed oil and continue with your metformin and you can discuss with your PCP if adding in the blackseed oil is a good option for controlling your diabetes.   Fortunately, all your labs here today were very reassuring.  Please continue to drink plenty of water at home as this will help both with your blood sugar management and with your COVID symptoms.  Return if your blood sugars continue to rise and you develop severe vomiting not responsive to medications.

## 2022-02-02 NOTE — ED Notes (Signed)
Pt is aware he needs a urine sample 

## 2022-05-10 ENCOUNTER — Other Ambulatory Visit: Payer: Self-pay | Admitting: Internal Medicine

## 2022-05-10 DIAGNOSIS — E785 Hyperlipidemia, unspecified: Secondary | ICD-10-CM

## 2022-05-10 MED ORDER — EZETIMIBE 10 MG PO TABS
10.0000 mg | ORAL_TABLET | Freq: Every day | ORAL | 3 refills | Status: DC
  Filled 2022-05-10: qty 90, 90d supply, fill #0

## 2022-05-11 ENCOUNTER — Other Ambulatory Visit (HOSPITAL_COMMUNITY): Payer: Self-pay

## 2022-05-31 ENCOUNTER — Other Ambulatory Visit (HOSPITAL_COMMUNITY): Payer: Self-pay

## 2022-05-31 ENCOUNTER — Other Ambulatory Visit: Payer: Self-pay | Admitting: Internal Medicine

## 2022-05-31 DIAGNOSIS — E119 Type 2 diabetes mellitus without complications: Secondary | ICD-10-CM

## 2022-07-06 ENCOUNTER — Other Ambulatory Visit (HOSPITAL_COMMUNITY): Payer: Self-pay

## 2022-07-06 ENCOUNTER — Other Ambulatory Visit: Payer: Self-pay

## 2022-07-06 DIAGNOSIS — N529 Male erectile dysfunction, unspecified: Secondary | ICD-10-CM

## 2022-07-06 DIAGNOSIS — I10 Essential (primary) hypertension: Secondary | ICD-10-CM

## 2022-07-07 MED ORDER — SILDENAFIL CITRATE 50 MG PO TABS: 50.0000 mg | ORAL_TABLET | Freq: Every day | ORAL | 0 refills | Status: DC | PRN

## 2022-07-07 MED ORDER — BENAZEPRIL HCL 20 MG PO TABS: 20.0000 mg | ORAL_TABLET | Freq: Every day | ORAL | 3 refills | Status: DC

## 2022-07-11 ENCOUNTER — Encounter: Payer: Self-pay | Admitting: Student

## 2022-07-19 ENCOUNTER — Encounter: Payer: Self-pay | Admitting: Student

## 2022-07-20 ENCOUNTER — Other Ambulatory Visit: Payer: Self-pay

## 2022-07-20 ENCOUNTER — Other Ambulatory Visit (HOSPITAL_COMMUNITY): Payer: Self-pay

## 2022-07-20 ENCOUNTER — Ambulatory Visit (INDEPENDENT_AMBULATORY_CARE_PROVIDER_SITE_OTHER): Payer: Self-pay | Admitting: Internal Medicine

## 2022-07-20 ENCOUNTER — Encounter: Payer: Self-pay | Admitting: Internal Medicine

## 2022-07-20 VITALS — Temp 97.8°F | Resp 28 | Ht 72.0 in | Wt 185.4 lb

## 2022-07-20 DIAGNOSIS — Z794 Long term (current) use of insulin: Secondary | ICD-10-CM

## 2022-07-20 DIAGNOSIS — I10 Essential (primary) hypertension: Secondary | ICD-10-CM

## 2022-07-20 DIAGNOSIS — Z91148 Patient's other noncompliance with medication regimen for other reason: Secondary | ICD-10-CM

## 2022-07-20 DIAGNOSIS — Z87891 Personal history of nicotine dependence: Secondary | ICD-10-CM

## 2022-07-20 DIAGNOSIS — E119 Type 2 diabetes mellitus without complications: Secondary | ICD-10-CM

## 2022-07-20 DIAGNOSIS — E785 Hyperlipidemia, unspecified: Secondary | ICD-10-CM

## 2022-07-20 LAB — POCT GLYCOSYLATED HEMOGLOBIN (HGB A1C): Hemoglobin A1C: 10.9 % — AB (ref 4.0–5.6)

## 2022-07-20 LAB — GLUCOSE, CAPILLARY: Glucose-Capillary: 346 mg/dL — ABNORMAL HIGH (ref 70–99)

## 2022-07-20 MED ORDER — PRAVASTATIN SODIUM 40 MG PO TABS
20.0000 mg | ORAL_TABLET | Freq: Every evening | ORAL | 3 refills | Status: DC
  Filled 2022-07-20: qty 45, 90d supply, fill #0
  Filled 2022-10-19: qty 45, 90d supply, fill #1

## 2022-07-20 MED ORDER — GLIPIZIDE 5 MG PO TABS
5.0000 mg | ORAL_TABLET | Freq: Two times a day (BID) | ORAL | 11 refills | Status: DC
  Filled 2022-07-20: qty 60, 30d supply, fill #0
  Filled 2022-09-02: qty 60, 30d supply, fill #1
  Filled 2022-10-19: qty 60, 30d supply, fill #2
  Filled 2022-12-14 (×2): qty 60, 30d supply, fill #3
  Filled 2023-01-26: qty 60, 30d supply, fill #4
  Filled 2023-03-08: qty 60, 30d supply, fill #0
  Filled 2023-03-08: qty 60, 30d supply, fill #5
  Filled 2023-04-18: qty 60, 30d supply, fill #1

## 2022-07-20 MED ORDER — BENAZEPRIL HCL 40 MG PO TABS
40.0000 mg | ORAL_TABLET | Freq: Every day | ORAL | 2 refills | Status: DC
  Filled 2022-07-20: qty 30, 30d supply, fill #0
  Filled 2022-09-02: qty 30, 30d supply, fill #1
  Filled 2022-10-19: qty 30, 30d supply, fill #2

## 2022-07-20 MED ORDER — METFORMIN HCL 1000 MG PO TABS
1000.0000 mg | ORAL_TABLET | Freq: Two times a day (BID) | ORAL | 11 refills | Status: DC
  Filled 2022-07-20: qty 60, 30d supply, fill #0
  Filled 2022-09-02: qty 60, 30d supply, fill #1
  Filled 2022-10-19: qty 60, 30d supply, fill #2
  Filled 2022-12-14 (×2): qty 60, 30d supply, fill #3
  Filled 2023-03-08: qty 60, 30d supply, fill #4
  Filled 2023-03-08: qty 60, 30d supply, fill #0
  Filled 2023-06-05: qty 60, 30d supply, fill #1

## 2022-07-20 NOTE — Progress Notes (Signed)
   CC: dm  HPI:Mr.Jeri Muckleroy is a 47 y.o. male who presents for evaluation of dm. Please see individual problem based A/P for details.   Depression, PHQ-9: Based on the patients  [place] Visit from 12/30/2020 in [place]  PHQ-9 Total Score 4      score we have .  Past Medical History:  Diagnosis Date   Diabetes mellitus 2002   Hypertension    Review of Systems:   See hpi  Physical Exam: Vitals:   07/20/22 1002  Resp: (!) 28  Temp: 97.8 F (36.6 C)  TempSrc: Oral  SpO2: 100%  Weight: 185 lb 6.4 oz (84.1 kg)  Height: 6' (1.829 m)   General: nad HEENT: Conjunctiva nl , antiicteric sclerae, moist mucous membranes, no exudate or erythema Cardiovascular: Normal rate, regular rhythm.  No murmurs, rubs, or gallops Pulmonary : Equal breath sounds, No wheezes, rales, or rhonchi Abdominal: soft, nontender,  bowel sounds present Ext: No edema in lower extremities, no tenderness to palpation of lower extremities.   Assessment & Plan:   See Encounters Tab for problem based charting.  Patient discussed with Dr. Angelia Mould

## 2022-07-20 NOTE — Patient Instructions (Addendum)
Dear Mr. Carlos [name],  Thank you for trusting [place] with your care.   We discussed your diabetes and blood pressure.   Please continue taking the Jardiance each day.  Please take 1 metformin tablet in the morning and 1 at night.  Please take 1 glipizide tablet in the morning and 1 at night.   For your blood pressure, please take the Benazepril 40mg  daily.   We have sent your medications to the [facility]. Please call them to get the delivery set up.  Please check blood pressure and blood sugar at home and record these values and bring them to the next visit.  We would like to see you back in 1 month for a follow up.

## 2022-07-22 ENCOUNTER — Encounter: Payer: Self-pay | Admitting: Internal Medicine

## 2022-07-22 DIAGNOSIS — Z91148 Patient's other noncompliance with medication regimen for other reason: Secondary | ICD-10-CM | POA: Insufficient documentation

## 2022-07-22 HISTORY — DX: Patient's other noncompliance with medication regimen for other reason: Z91.148

## 2022-07-22 NOTE — Assessment & Plan Note (Signed)
Does not routinely fill his prescriptions.  States that he has difficulty getting to the pharmacy.  We will send meds through [facility].  Instructed him to call them to set up delivery for home delivery to facilitate adherence.

## 2022-07-22 NOTE — Assessment & Plan Note (Signed)
>>  ASSESSMENT AND PLAN FOR DIABETES MELLITUS TYPE 2, INSULIN  DEPENDENT (HCC) WRITTEN ON 07/22/2022  5:00 PM BY GAWALUCK, GREYLON, MD  Patient prescribed Jardiance , semaglutide , metformin .  He does not check his blood sugar at home, does not adhere to a strict diet but does state he avoids soda typically.  His Jardiance  is mailed to him through [facility].  He does not routinely fill other medications.  Says that he has difficulty picking them up.  He has not been able to reliably get Rybelsus .  He is due for eye exam and urine ACR. A1c appears largely stable with unchanged control. We will discontinue the Rybelsus  since he cannot regularly getting a self-pay.  Will start him on glipizide  twice daily since this will be cheaper and easier for him to afford.  Will continue the metformin  1000 mg twice daily.  And we will continue the Jardiance  as well.  Will switch over to [facility] pharmacy to get delivery set up.  Will need to wait for eye exam once he has insurance.  He will return for urine ACR for lab only visit once he has enough money to afford this.

## 2022-07-22 NOTE — Assessment & Plan Note (Addendum)
Patient prescribed Jardiance, semaglutide, metformin.  He does not check his blood sugar at home, does not adhere to a strict diet but does state he avoids soda typically.  His Jardiance is mailed to him through [facility].  He does not routinely fill other medications.  Says that he has difficulty picking them up.  He has not been able to reliably get Rybelsus.  He is due for eye exam and urine ACR. A1c appears largely stable with unchanged control. We will discontinue the Rybelsus since he cannot regularly getting a self-pay.  Will start him on glipizide twice daily since this will be cheaper and easier for him to afford.  Will continue the metformin 1000 mg twice daily.  And we will continue the Jardiance as well.  Will switch over to [facility] to get delivery set up.  Will need to wait for eye exam once he has insurance.  He will return for urine ACR for lab only visit once he has enough money to afford this.

## 2022-07-22 NOTE — Assessment & Plan Note (Signed)
Patient reports compliance with his benazepril.  Says he took it this morning, but that blood pressure may be high due to smoking.  He says he checks blood pressure at home and that the cuff tells him " good" but he cannot tell me what the actual values are.  Patient's blood pressure above goal.  Previously on amlodipine but had issues with the ED with this.  We will increase his benazepril to 40 mg daily.  He will check his blood pressure at home and follow-up with [place].

## 2022-07-27 NOTE — Progress Notes (Signed)
Internal Medicine Clinic Attending  Case discussed with the resident at the time of the visit.  We reviewed the resident's history and exam and pertinent patient test results.  I agree with the assessment, diagnosis, and plan of care documented in the resident's note.  

## 2022-08-03 ENCOUNTER — Encounter: Payer: Self-pay | Admitting: Internal Medicine

## 2022-08-19 IMAGING — CT CT CERVICAL SPINE W/O CM
4 of 8 series · 12 of 33 positions shown, 13 images · non-contrast
Comparison: None.

CLINICAL DATA: Cervical radiculopathy, fall from tow truck 4 months
ago, right-sided thumb numbness and right arm and shoulder pain

EXAM:
CT CERVICAL SPINE WITHOUT CONTRAST
TECHNIQUE: Multidetector CT imaging of the cervical spine was performed without
intravenous contrast. Multiplanar CT image reconstructions were also
generated.

[Series 3: c spine soft · axial · 0.40mm/px · z∈[-135,-77]mm · 2 of 87 slices shown]
[im 29/87  soft-tissue]
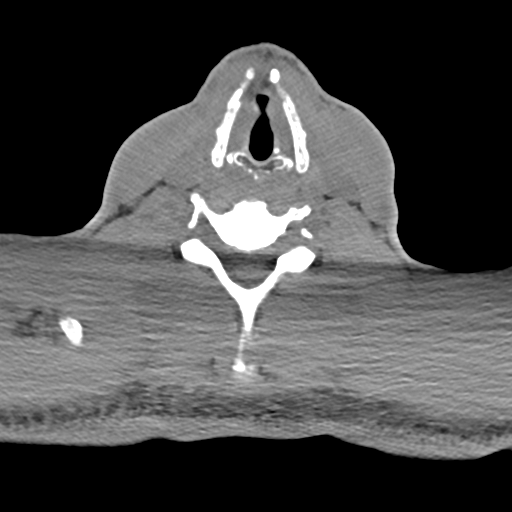
[im 58/87  soft-tissue]
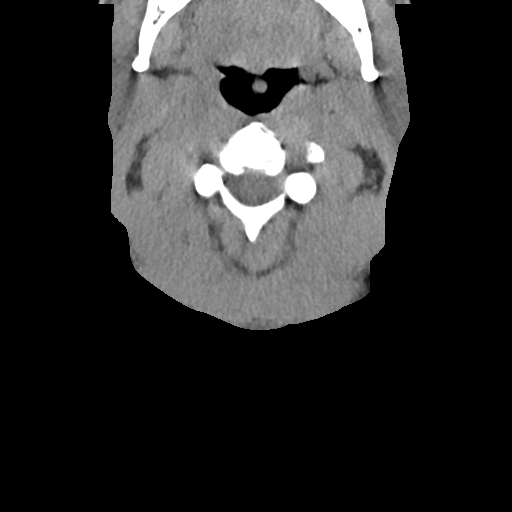

[Series 6: coronal bone · coronal · 0.27mm/px · 2 of 67 slices shown]
[im 25/67  bone]
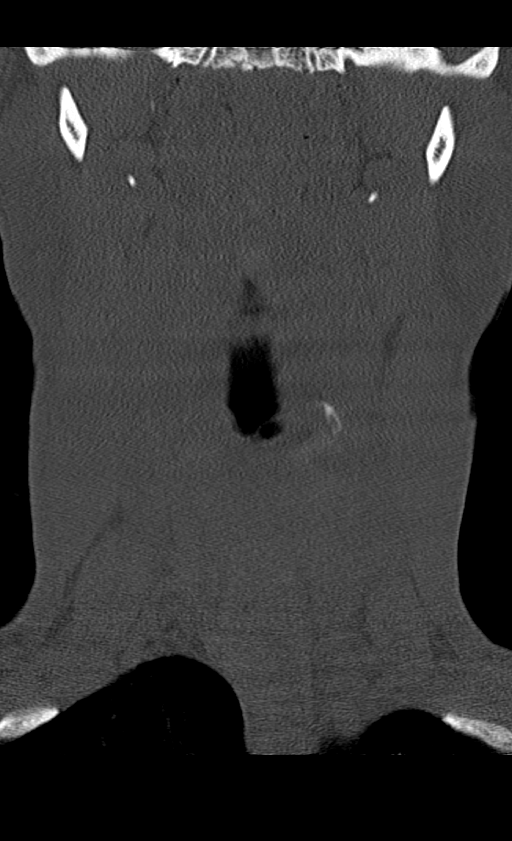
[im 49/67  bone]
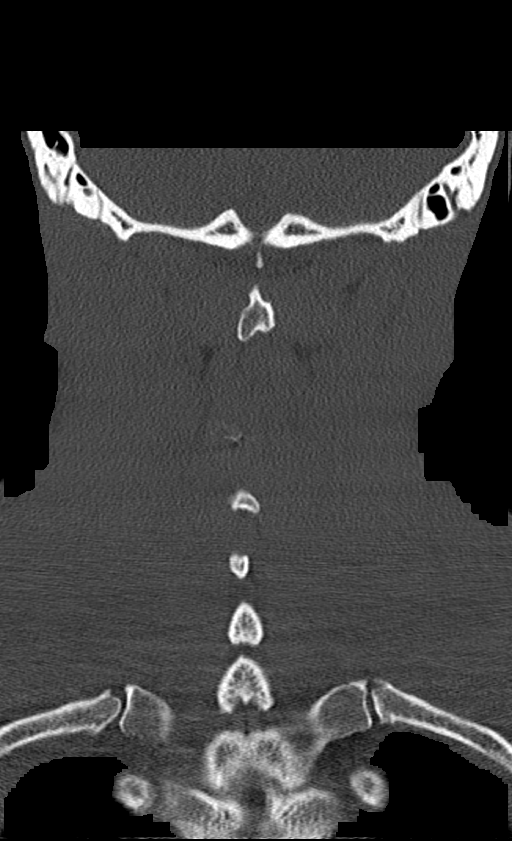

[Series 7: orthogonal bone · axial · 0.28mm/px · z∈[-191,-88]mm · 3 of 115 slices shown, 4 images]
[im 29/115  soft-tissue]
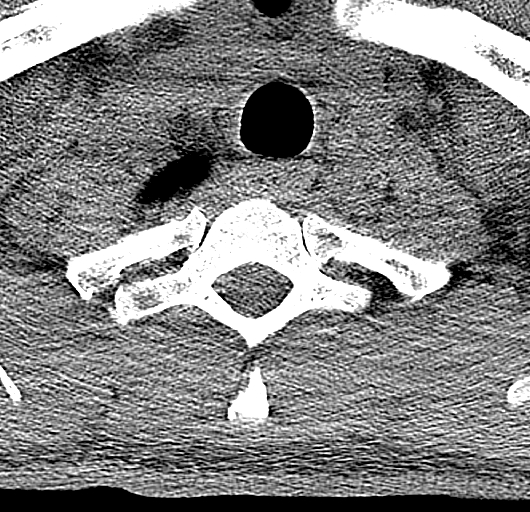
[im 29/115  bone]
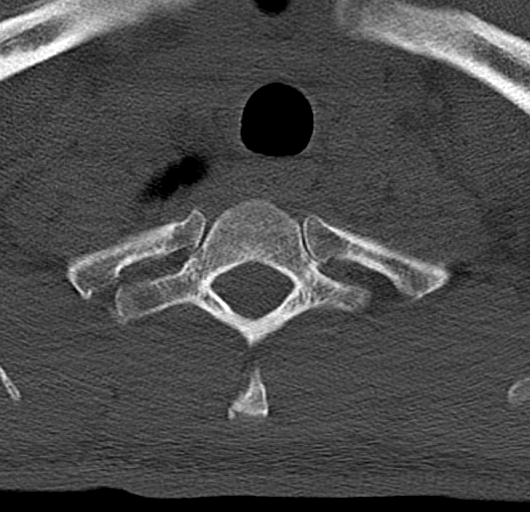
[im 58/115  bone]
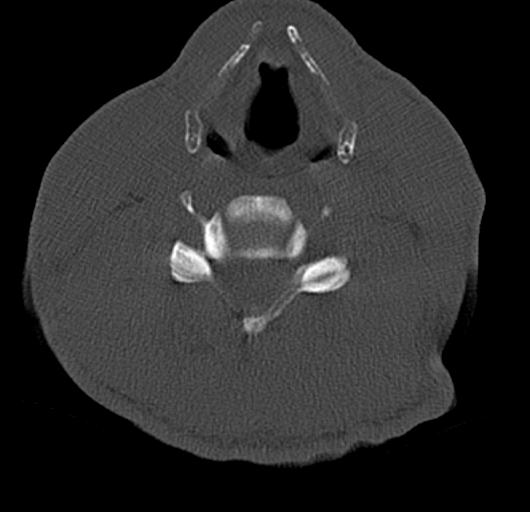
[im 86/115  bone]
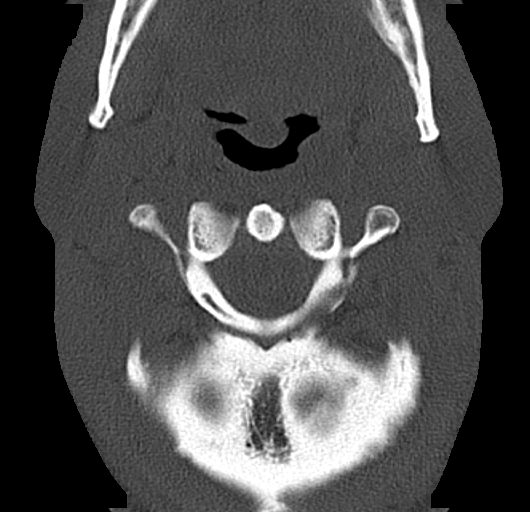

[Series 11: sagittal bone · sagittal · 0.24mm/px · 5 of 63 slices shown]
[im 11/63  bone]
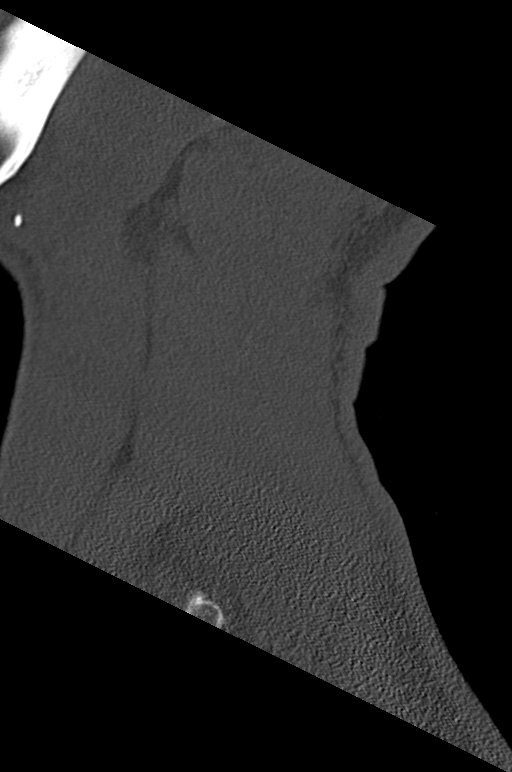
[im 21/63  bone]
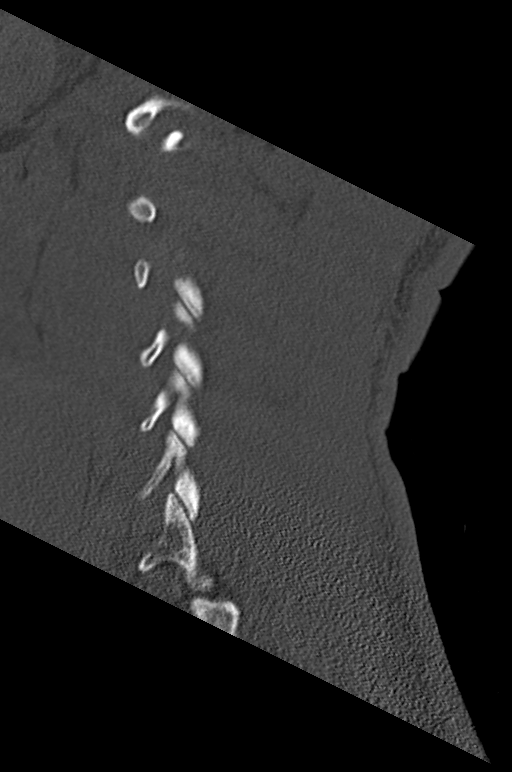
[im 32/63  bone]
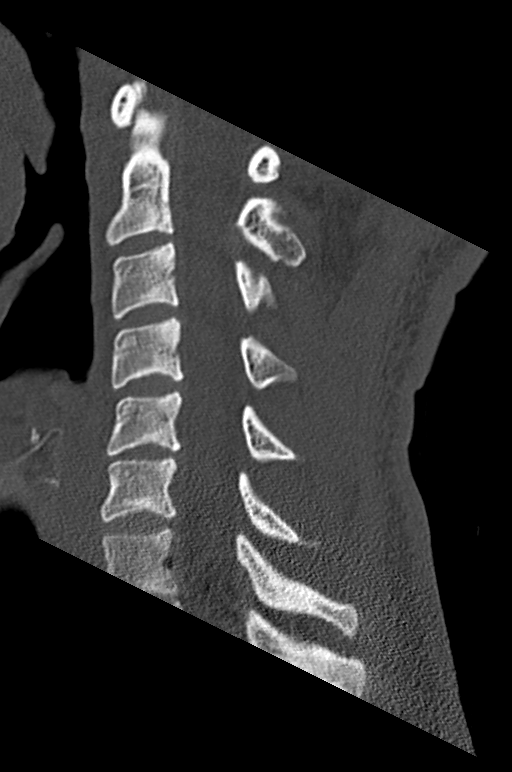
[im 42/63  bone]
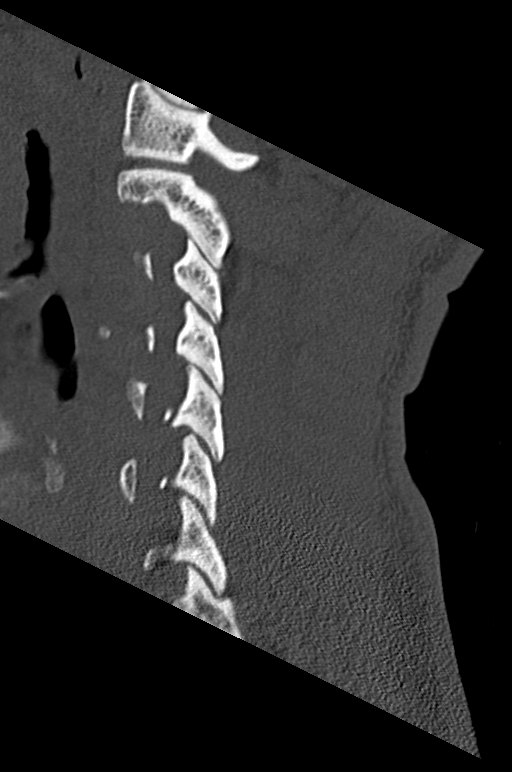
[im 52/63  bone]
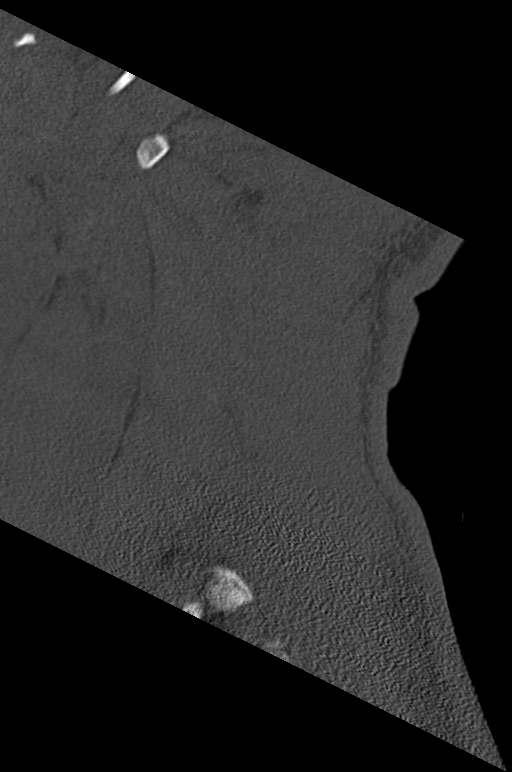

[12 of 33 positions shown; findings below may reference images not displayed]

FINDINGS: Alignment: Straightening of the normal cervical lordosis.

Skull base and vertebrae: No acute fracture. No primary bone lesion
or focal pathologic process.

Soft tissues and spinal canal: No prevertebral fluid or swelling. No
visible canal hematoma.

Disc levels: There is minimal multilevel disc space height loss and
uncovertebral change, particularly notable at the right aspect of
C4-C5 (series 11, image 27). No overt disc bulge appreciated by
noncontrast CT.

Upper chest: Negative.

Other: None.
IMPRESSION: 1. No fracture or static subluxation of the cervical spine.
2. There is minimal multilevel disc space height loss and
uncovertebral change, particularly notable at the right aspect of
C4-C5. No overt disc bulge appreciated by noncontrast CT. Consider
MRI to further evaluate cervical disc and neural foraminal pathology
if indicated by localizing signs and symptoms.

## 2022-09-02 ENCOUNTER — Other Ambulatory Visit (HOSPITAL_COMMUNITY): Payer: Self-pay

## 2022-09-05 ENCOUNTER — Other Ambulatory Visit (HOSPITAL_COMMUNITY): Payer: Self-pay

## 2022-10-19 ENCOUNTER — Other Ambulatory Visit (HOSPITAL_COMMUNITY): Payer: Self-pay

## 2022-12-14 ENCOUNTER — Other Ambulatory Visit (HOSPITAL_COMMUNITY): Payer: Self-pay

## 2022-12-14 ENCOUNTER — Other Ambulatory Visit: Payer: Self-pay | Admitting: Internal Medicine

## 2022-12-14 DIAGNOSIS — I10 Essential (primary) hypertension: Secondary | ICD-10-CM

## 2022-12-14 MED ORDER — BENAZEPRIL HCL 40 MG PO TABS
40.0000 mg | ORAL_TABLET | Freq: Every day | ORAL | 3 refills | Status: DC
  Filled 2022-12-14 – 2023-01-02 (×4): qty 30, 30d supply, fill #0
  Filled 2023-01-27: qty 30, 30d supply, fill #1
  Filled 2023-03-08: qty 30, 30d supply, fill #0
  Filled 2023-03-08: qty 30, 30d supply, fill #2
  Filled 2023-04-18: qty 30, 30d supply, fill #1
  Filled 2023-06-05: qty 30, 30d supply, fill #2

## 2022-12-16 ENCOUNTER — Other Ambulatory Visit: Payer: Self-pay

## 2022-12-16 ENCOUNTER — Other Ambulatory Visit (HOSPITAL_COMMUNITY): Payer: Self-pay

## 2022-12-21 ENCOUNTER — Other Ambulatory Visit: Payer: Self-pay

## 2023-01-02 ENCOUNTER — Other Ambulatory Visit (HOSPITAL_COMMUNITY): Payer: Self-pay

## 2023-01-24 ENCOUNTER — Encounter: Payer: Self-pay | Admitting: Student

## 2023-01-26 ENCOUNTER — Other Ambulatory Visit (HOSPITAL_COMMUNITY): Payer: Self-pay

## 2023-01-26 ENCOUNTER — Other Ambulatory Visit: Payer: Self-pay | Admitting: Internal Medicine

## 2023-01-26 DIAGNOSIS — N529 Male erectile dysfunction, unspecified: Secondary | ICD-10-CM

## 2023-01-26 MED ORDER — SILDENAFIL CITRATE 50 MG PO TABS
50.0000 mg | ORAL_TABLET | Freq: Every day | ORAL | 0 refills | Status: DC | PRN
  Filled 2023-01-26 – 2023-01-27 (×2): qty 10, 10d supply, fill #0

## 2023-01-27 ENCOUNTER — Other Ambulatory Visit (HOSPITAL_COMMUNITY): Payer: Self-pay

## 2023-01-27 ENCOUNTER — Other Ambulatory Visit: Payer: Self-pay

## 2023-01-29 ENCOUNTER — Encounter (HOSPITAL_BASED_OUTPATIENT_CLINIC_OR_DEPARTMENT_OTHER): Payer: Self-pay | Admitting: Emergency Medicine

## 2023-01-29 ENCOUNTER — Emergency Department (HOSPITAL_BASED_OUTPATIENT_CLINIC_OR_DEPARTMENT_OTHER)
Admission: EM | Admit: 2023-01-29 | Discharge: 2023-01-29 | Disposition: A | Discharge status: Home / Self Care | Payer: 59 | Attending: Emergency Medicine | Admitting: Emergency Medicine

## 2023-01-29 ENCOUNTER — Other Ambulatory Visit: Payer: Self-pay

## 2023-01-29 DIAGNOSIS — U071 COVID-19: Secondary | ICD-10-CM | POA: Insufficient documentation

## 2023-01-29 DIAGNOSIS — E119 Type 2 diabetes mellitus without complications: Secondary | ICD-10-CM | POA: Diagnosis not present

## 2023-01-29 DIAGNOSIS — I10 Essential (primary) hypertension: Secondary | ICD-10-CM | POA: Insufficient documentation

## 2023-01-29 DIAGNOSIS — J029 Acute pharyngitis, unspecified: Secondary | ICD-10-CM | POA: Diagnosis present

## 2023-01-29 DIAGNOSIS — Z7984 Long term (current) use of oral hypoglycemic drugs: Secondary | ICD-10-CM | POA: Insufficient documentation

## 2023-01-29 LAB — RESP PANEL BY RT-PCR (RSV, FLU A&B, COVID)  RVPGX2
Influenza A by PCR: NEGATIVE
Influenza B by PCR: NEGATIVE
Resp Syncytial Virus by PCR: NEGATIVE
SARS Coronavirus 2 by RT PCR: POSITIVE — AB

## 2023-01-29 LAB — GROUP A STREP BY PCR: Group A Strep by PCR: NOT DETECTED

## 2023-01-29 NOTE — ED Triage Notes (Signed)
Sore throat, cold chills, generalized body aches started yesterday.  Unknown fever.

## 2023-01-29 NOTE — Discharge Instructions (Addendum)
It was a pleasure taking care of you!   Your strep, flu, RSV swab was negative today.  Your COVID swab was positive today.  You may take over-the-counter cough and cold medications as needed for your symptoms.  You may take over-the-counter 500 mg Tylenol every 6 hours and alternate with 600 mg ibuprofen every 6 hours as needed for your symptoms.  If you take over-the-counter Tylenol do not take over-the-counter cough and cold medications at the same time as they have Tylenol in them as well.  Ensure to maintain fluid intake with tea, soup, broth, Pedialyte, Gatorade, water. You may follow-up with your primary care provider as needed.  Return to the Emergency Department if you are experiencing trouble breathing, worsening or increasing chest pain, decreased fluid intake or worsening symptoms.

## 2023-01-29 NOTE — ED Provider Notes (Signed)
[place] [facility] AT [facility] Provider Note   CSN: [id number] Arrival date & time: 01/29/23  1054     History  Chief Complaint  Patient presents with   URI    Carlos [name] is a 47 y.o. male with a past medical history of diabetes, hypertension who presents emergency department concerns for URI-like symptoms onset yesterday.  Notes that he was around possible sick contacts yesterday.  Has associated sore throat, chills, generalized bodyaches, cough.  No meds tried at home for symptoms.  Denies trouble breathing, rhinorrhea, nasal congestion.  The history is provided by the patient. No language interpreter was used.       Home Medications Prior to Admission medications   Medication Sig Start Date End Date Taking? Authorizing Provider  benazepril (LOTENSIN) 40 MG tablet Take 1 tablet (40 mg total) by mouth daily. 12/14/22   Masters, Katie, DO  Cinnamon 500 MG capsule Take 500 mg by mouth daily.    [provider]  empagliflozin (JARDIANCE) 10 MG TABS tablet Take 1 tablet (10 mg total) by mouth daily before breakfast. 12/13/21   Masters, Katie, DO  glipiZIDE (GLUCOTROL) 5 MG tablet Take 1 tablet (5 mg total) by mouth 2 (two) times daily. 07/20/22 07/20/23  Adron Bene, MD  metFORMIN (GLUCOPHAGE) 1000 MG tablet Take 1 tablet (1,000 mg total) by mouth 2 (two) times daily with a meal. 07/20/22 07/20/23  Adron Bene, MD  ondansetron (ZOFRAN) 4 MG tablet Take 1 tablet (4 mg total) by mouth every 6 (six) hours. 02/02/22   Janell Quiet, PA-C  pravastatin (PRAVACHOL) 40 MG tablet Take 0.5 tablets (20 mg total) by mouth every evening. 07/20/22 07/20/23  Adron Bene, MD  sildenafil (VIAGRA) 50 MG tablet Take 1 tablet (50 mg total) by mouth daily as needed for erectile dysfunction. 01/26/23   Masters, Florentina Addison, DO      Allergies    Penicillins and Sitagliptin-metformin hcl    Review of Systems   Review of Systems  All other systems reviewed and are  negative.   Physical Exam Updated Vital Signs BP (!) 175/102 (BP Location: Left Arm)   Pulse 94   Temp 98.8 F (37.1 C) (Oral)   Resp 20   Ht 6\' 2"  (1.88 m)   Wt 86.2 kg   SpO2 99%   BMI 24.39 kg/m  Physical Exam Vitals and nursing note reviewed.  Constitutional:      General: He is not in acute distress.    Appearance: Normal appearance.  HENT:     Mouth/Throat:     Mouth: Mucous membranes are moist.     Pharynx: Oropharynx is clear. Uvula midline. No oropharyngeal exudate or posterior oropharyngeal erythema.     Comments: Uvula midline without swelling. No posterior pharyngeal erythema or tonsillar exudate noted. Patent airway. Pt able to speak in clear complete sentences. Tolerating oral secretions. Eyes:     General: No scleral icterus.    Extraocular Movements: Extraocular movements intact.  Cardiovascular:     Rate and Rhythm: Normal rate.  Pulmonary:     Effort: Pulmonary effort is normal. No respiratory distress.  Abdominal:     Palpations: Abdomen is soft. There is no mass.     Tenderness: There is no abdominal tenderness.  Musculoskeletal:        General: Normal range of motion.     Cervical back: Neck supple.  Skin:    General: Skin is warm and dry.     Findings: No  rash.  Neurological:     Mental Status: He is alert.     Sensory: Sensation is intact.     Motor: Motor function is intact.  Psychiatric:        Behavior: Behavior normal.     ED Results / Procedures / Treatments   Labs (all labs ordered are listed, but only abnormal results are displayed) Labs Reviewed  GROUP A STREP BY PCR  RESP PANEL BY RT-PCR (RSV, FLU A&B, COVID)  [id number]    EKG None  Radiology No results found.  Procedures Procedures    Medications Ordered in ED Medications - No data to display  ED Course/ Medical Decision Making/ A&P Clinical Course as of 01/29/23 1227  Sun Jan 29, 2023  1223 SARS Coronavirus 2 by RT PCR(!): POSITIVE Discussed with patient and  wife regarding swab findings. Discussed discharge treatment plan. Answered all available questions. Pt appears safe for discharge at this time.  [SB]    Clinical Course User Index [SB] Desiree Daise A, PA-C                                 Medical Decision Making Amount and/or Complexity of Data Reviewed Labs:  Decision-making details documented in ED Course.   Pt presents with concerns for URI like symptoms onset yesterday. Possible sick contacts. Vital signs, pt afebrile. On exam, pt with Uvula midline without swelling. No posterior pharyngeal erythema or tonsillar exudate noted. Patent airway. Pt able to speak in clear complete sentences. Tolerating oral secretions. No acute cardiovascular, respiratory, abdominal exam findings. Differential diagnosis includes COVID, flu, PNA, RSV, viral URI with cough.   Labs:  I ordered, and personally interpreted labs.  The pertinent results include:   COVID swab positive Strep, RSV, Flu swab negative  Disposition: Pt presentation suspicious for COVID-19. Doubt flu, RSV, PNA, or viral URI with cough at this time. After consideration of the diagnostic results and the patients response to treatment, I feel that the patient would benefit from Discharge home. Work note provided. Supportive care measures and strict return precautions discussed with patient at bedside. Pt acknowledges and verbalizes understanding. Pt appears safe for discharge. Follow up as indicated in discharge paperwork.    This chart was dictated using voice recognition software, Dragon. Despite the best efforts of this provider to proofread and correct errors, errors may still occur which can change documentation meaning.   Final Clinical Impression(s) / ED Diagnoses Final diagnoses:  None    Rx / DC Orders ED Discharge Orders     None         Milbern Doescher A, PA-C 01/29/23 1228    Virgina Norfolk, DO 01/29/23 1230

## 2023-03-08 ENCOUNTER — Other Ambulatory Visit (HOSPITAL_COMMUNITY): Payer: Self-pay

## 2023-04-06 ENCOUNTER — Other Ambulatory Visit: Payer: Self-pay

## 2023-04-06 ENCOUNTER — Emergency Department (HOSPITAL_BASED_OUTPATIENT_CLINIC_OR_DEPARTMENT_OTHER)
Admission: EM | Admit: 2023-04-06 | Discharge: 2023-04-07 | Disposition: A | Discharge status: Home / Self Care | Payer: 59 | Attending: Emergency Medicine | Admitting: Emergency Medicine

## 2023-04-06 ENCOUNTER — Encounter (HOSPITAL_BASED_OUTPATIENT_CLINIC_OR_DEPARTMENT_OTHER): Payer: Self-pay

## 2023-04-06 DIAGNOSIS — Z20822 Contact with and (suspected) exposure to covid-19: Secondary | ICD-10-CM | POA: Diagnosis not present

## 2023-04-06 DIAGNOSIS — E119 Type 2 diabetes mellitus without complications: Secondary | ICD-10-CM | POA: Diagnosis not present

## 2023-04-06 DIAGNOSIS — R112 Nausea with vomiting, unspecified: Secondary | ICD-10-CM | POA: Diagnosis present

## 2023-04-06 DIAGNOSIS — I10 Essential (primary) hypertension: Secondary | ICD-10-CM | POA: Diagnosis not present

## 2023-04-06 LAB — CBC WITH DIFFERENTIAL/PLATELET
Abs Immature Granulocytes: 0.02 10*3/uL (ref 0.00–0.07)
Basophils Absolute: 0 10*3/uL (ref 0.0–0.1)
Basophils Relative: 0 %
Eosinophils Absolute: 0 10*3/uL (ref 0.0–0.5)
Eosinophils Relative: 0 %
HCT: 40.8 % (ref 39.0–52.0)
Hemoglobin: 13.9 g/dL (ref 13.0–17.0)
Immature Granulocytes: 0 %
Lymphocytes Relative: 11 %
Lymphs Abs: 1 10*3/uL (ref 0.7–4.0)
MCH: 32.1 pg (ref 26.0–34.0)
MCHC: 34.1 g/dL (ref 30.0–36.0)
MCV: 94.2 fL (ref 80.0–100.0)
Monocytes Absolute: 0.3 10*3/uL (ref 0.1–1.0)
Monocytes Relative: 3 %
Neutro Abs: 7.3 10*3/uL (ref 1.7–7.7)
Neutrophils Relative %: 86 %
Platelets: 308 10*3/uL (ref 150–400)
RBC: 4.33 MIL/uL (ref 4.22–5.81)
RDW: 13.4 % (ref 11.5–15.5)
WBC: 8.5 10*3/uL (ref 4.0–10.5)
nRBC: 0 % (ref 0.0–0.2)

## 2023-04-06 LAB — COMPREHENSIVE METABOLIC PANEL
ALT: 19 U/L (ref 0–44)
AST: 22 U/L (ref 15–41)
Albumin: 4.3 g/dL (ref 3.5–5.0)
Alkaline Phosphatase: 65 U/L (ref 38–126)
Anion gap: 13 (ref 5–15)
BUN: 12 mg/dL (ref 6–20)
CO2: 24 mmol/L (ref 22–32)
Calcium: 9 mg/dL (ref 8.9–10.3)
Chloride: 97 mmol/L — ABNORMAL LOW (ref 98–111)
Creatinine, Ser: 0.74 mg/dL (ref 0.61–1.24)
GFR, Estimated: >60 mL/min (ref >60)
Glucose, Bld: 256 mg/dL — ABNORMAL HIGH (ref 70–99)
Potassium: 3.9 mmol/L (ref 3.5–5.1)
Sodium: 134 mmol/L — ABNORMAL LOW (ref 135–145)
Total Bilirubin: 1.2 mg/dL (ref 0.3–1.2)
Total Protein: 8.1 g/dL (ref 6.5–8.1)

## 2023-04-06 LAB — LIPASE, BLOOD: Lipase: 143 U/L — ABNORMAL HIGH (ref 11–51)

## 2023-04-06 LAB — SARS CORONAVIRUS 2 BY RT PCR: SARS Coronavirus 2 by RT PCR: NEGATIVE

## 2023-04-06 MED ORDER — IBUPROFEN 400 MG PO TABS
400.0000 mg | ORAL_TABLET | Freq: Once | ORAL | Status: AC
  Administered 2023-04-06: 400 mg via ORAL
  Filled 2023-04-06: qty 1

## 2023-04-06 MED ORDER — ONDANSETRON 4 MG PO TBDP
4.0000 mg | ORAL_TABLET | Freq: Once | ORAL | Status: AC
  Administered 2023-04-06: 4 mg via ORAL
  Filled 2023-04-06: qty 1

## 2023-04-06 NOTE — ED Triage Notes (Signed)
Pt reports 24 hours of chills and emesis (x10). No on at home is sick. Pt denies fevers.

## 2023-04-06 NOTE — ED Provider Notes (Signed)
[place] [facility] Provider Note   CSN: [id number] Arrival date & time: 04/06/23  1930     History  Chief Complaint  Patient presents with   Emesis   Chills    Carlos [name] is a 47 y.o. male with a past medical history of hypertension, diabetes presents today for evaluation of vomiting.  Patient reports 10 episodes of nonbloody nonbilious vomiting since 5 AM today.  He reports some cold/chills at home.  He states that one of his coworkers is sick at work.  He denies any chest pain, shortness of breath, abdominal pain, bowel change, urinary symptoms.   Emesis   Past Medical History:  Diagnosis Date   Diabetes mellitus 2002   Hypertension    Past Surgical History:  Procedure Laterality Date   FEMUR FRACTURE SURGERY     right   INCISION AND DRAINAGE PERIRECTAL ABSCESS N/A 01/02/2016   Procedure: IRRIGATION AND DEBRIDEMENT PERIRECTAL ABSCESS;  Surgeon: Axel Filler, MD;  Location: [facility] OR;  Service: General;  Laterality: N/A;     Home Medications Prior to Admission medications   Medication Sig Start Date End Date Taking? Authorizing Provider  benazepril (LOTENSIN) 40 MG tablet Take 1 tablet (40 mg total) by mouth daily. 12/14/22   Masters, Katie, DO  Cinnamon 500 MG capsule Take 500 mg by mouth daily.    [provider]  empagliflozin (JARDIANCE) 10 MG TABS tablet Take 1 tablet (10 mg total) by mouth daily before breakfast. 12/13/21   Masters, Katie, DO  glipiZIDE (GLUCOTROL) 5 MG tablet Take 1 tablet (5 mg total) by mouth 2 (two) times daily. 07/20/22 07/20/23  Adron Bene, MD  metFORMIN (GLUCOPHAGE) 1000 MG tablet Take 1 tablet (1,000 mg total) by mouth 2 (two) times daily with a meal. 07/20/22 07/20/23  Adron Bene, MD  ondansetron (ZOFRAN) 4 MG tablet Take 1 tablet (4 mg total) by mouth every 6 (six) hours. 02/02/22   Janell Quiet, PA-C  pravastatin (PRAVACHOL) 40 MG tablet Take 0.5 tablets (20 mg total) by mouth every  evening. 07/20/22 07/20/23  Adron Bene, MD  sildenafil (VIAGRA) 50 MG tablet Take 1 tablet (50 mg total) by mouth daily as needed for erectile dysfunction. 01/26/23   Masters, Katie, DO      Allergies    Penicillins and Sitagliptin-metformin hcl    Review of Systems   Review of Systems  Gastrointestinal:  Positive for vomiting.    Physical Exam Updated Vital Signs BP (!) 177/89   Pulse 99   Temp 98.4 F (36.9 C) (Oral)   Resp 17   Ht 6\' 2"  (1.88 m)   Wt 86.2 kg   SpO2 99%   BMI 24.39 kg/m  Physical Exam Vitals and nursing note reviewed.  Constitutional:      Appearance: Normal appearance.  HENT:     Head: Normocephalic and atraumatic.     Mouth/Throat:     Mouth: Mucous membranes are moist.  Eyes:     General: No scleral icterus. Cardiovascular:     Rate and Rhythm: Normal rate and regular rhythm.     Pulses: Normal pulses.     Heart sounds: Normal heart sounds.  Pulmonary:     Effort: Pulmonary effort is normal.     Breath sounds: Normal breath sounds.  Abdominal:     General: Abdomen is flat.     Palpations: Abdomen is soft.     Tenderness: There is no abdominal tenderness.  Comments: Abdomen is soft, nontender.  Musculoskeletal:        General: No deformity.  Skin:    General: Skin is warm.     Findings: No rash.  Neurological:     General: No focal deficit present.     Mental Status: He is alert.  Psychiatric:        Mood and Affect: Mood normal.     ED Results / Procedures / Treatments   Labs (all labs ordered are listed, but only abnormal results are displayed) Labs Reviewed  SARS CORONAVIRUS 2 BY RT PCR  COMPREHENSIVE METABOLIC PANEL  LIPASE, BLOOD  CBC WITH DIFFERENTIAL/PLATELET  URINALYSIS, ROUTINE W REFLEX MICROSCOPIC    EKG None  Radiology No results found.  Procedures Procedures    Medications Ordered in ED Medications  ondansetron (ZOFRAN-ODT) disintegrating tablet 4 mg (4 mg Oral Given 04/06/23 2008)  ibuprofen  (ADVIL) tablet 400 mg (400 mg Oral Given 04/06/23 2009)    ED Course/ Medical Decision Making/ A&P                                 Medical Decision Making Amount and/or Complexity of Data Reviewed Labs: ordered.  Risk Prescription drug management.   This patient presents to the ED for fatigue, vomiting, this involves an extensive number of treatment options, and is a complaint that carries with a high risk of complications and morbidity.  The differential diagnosis includes small bowel obstruction, acute gastroenteritis, appendicitis, gallbladder/biliary, pancreatitis, peptic ulcer disease, perforation, vertigo ACS/MI DKA, EtOH intoxication, cannabinoid hyperemesis..  This is not an exhaustive list.  Lab tests: I ordered and personally interpreted labs.  The pertinent results include: WBC unremarkable. Hbg unremarkable. Platelets unremarkable. Electrolytes unremarkable. BUN, creatinine unremarkable. Lipase 143.  Problem list/ ED course/ Critical interventions/ Medical management: HPI: See above Vital signs within normal range and stable throughout visit. Laboratory/imaging studies significant for: See above. On physical examination, patient is afebrile and appears in no acute distress.  Labs are significant for lipase of 143.  No evidence of leukocytosis, anemia, significant electrolyte abnormalities or AKI.  Blood glucose 256, anion gap is normal, low suspicion for DKA/HHS.  I have low suspicion for any acute emergent causes of abdominal pain at this point.  Patient is given Zofran here and was able to tolerate p.o.  He denies any alcohol use or cannabinol use to concern for EtOH intoxication or cannabinoid hyperemesis. Encouraged hydration at home and follow-up with PCP for further evaluation and management.  Strict ED return precaution discussed.  Patient is agreeable to the plan. I have reviewed the patient home medicines and have made adjustments as needed.  Cardiac  monitoring/EKG: The patient was maintained on a cardiac monitor.  I personally reviewed and interpreted the cardiac monitor which showed an underlying rhythm of: sinus rhythm.  Additional history obtained: External records from outside source obtained and reviewed including: Chart review including previous notes, labs, imaging.  Consultations obtained:  Disposition Continued outpatient therapy. Follow-up with PCP recommended for reevaluation of symptoms. Treatment plan discussed with patient.  Pt acknowledged understanding was agreeable to the plan. Worrisome signs and symptoms were discussed with patient, and patient acknowledged understanding to return to the ED if they noticed these signs and symptoms. Patient was stable upon discharge.   This chart was dictated using voice recognition software.  Despite best efforts to proofread,  errors can occur which can change the documentation meaning.  Final Clinical Impression(s) / ED Diagnoses Final diagnoses:  Nausea and vomiting, unspecified vomiting type    Rx / DC Orders ED Discharge Orders          Ordered    ondansetron (ZOFRAN-ODT) 4 MG disintegrating tablet  Every 8 hours PRN        04/07/23 0011              Jeanelle Malling, PA 04/08/23 1201    Elayne Snare K, DO 04/12/23 1459

## 2023-04-07 MED ORDER — ONDANSETRON 4 MG PO TBDP: 4.0000 mg | ORAL_TABLET | Freq: Three times a day (TID) | ORAL | 0 refills | Status: DC | PRN

## 2023-04-07 NOTE — Discharge Instructions (Addendum)
Please take tylenol/ibuprofen for pain, take zofran for nausea/vomiting. I recommend close follow-up with PCP for reevaluation.  Please do not hesitate to return to emergency department if worrisome signs symptoms we discussed become apparent.

## 2023-04-18 ENCOUNTER — Other Ambulatory Visit (HOSPITAL_COMMUNITY): Payer: Self-pay

## 2023-04-19 ENCOUNTER — Other Ambulatory Visit (HOSPITAL_COMMUNITY): Payer: Self-pay

## 2023-05-08 ENCOUNTER — Ambulatory Visit: Payer: BLUE CROSS/BLUE SHIELD | Admitting: Student

## 2023-05-08 ENCOUNTER — Encounter: Payer: Self-pay | Admitting: Student

## 2023-05-08 ENCOUNTER — Other Ambulatory Visit: Payer: Self-pay | Admitting: Student

## 2023-05-08 VITALS — BP 143/77 | HR 105 | Temp 99.0°F | Ht 74.0 in | Wt 193.6 lb

## 2023-05-08 DIAGNOSIS — N529 Male erectile dysfunction, unspecified: Secondary | ICD-10-CM | POA: Diagnosis not present

## 2023-05-08 DIAGNOSIS — Z794 Long term (current) use of insulin: Secondary | ICD-10-CM

## 2023-05-08 DIAGNOSIS — E119 Type 2 diabetes mellitus without complications: Secondary | ICD-10-CM | POA: Diagnosis not present

## 2023-05-08 DIAGNOSIS — I10 Essential (primary) hypertension: Secondary | ICD-10-CM

## 2023-05-08 LAB — GLUCOSE, CAPILLARY: Glucose-Capillary: 285 mg/dL — ABNORMAL HIGH (ref 70–99)

## 2023-05-08 LAB — POCT GLYCOSYLATED HEMOGLOBIN (HGB A1C): Hemoglobin A1C: 10.3 % — AB (ref 4.0–5.6)

## 2023-05-08 MED ORDER — LANCET DEVICE MISC: 1.0000 | Freq: Three times a day (TID) | 0 refills | Status: AC

## 2023-05-08 MED ORDER — TADALAFIL 10 MG PO TABS: 10.0000 mg | ORAL_TABLET | ORAL | 1 refills | Status: AC | PRN

## 2023-05-08 MED ORDER — LANCETS MISC. MISC: 1.0000 | Freq: Three times a day (TID) | 0 refills | Status: AC

## 2023-05-08 MED ORDER — EMPAGLIFLOZIN 10 MG PO TABS: 10.0000 mg | ORAL_TABLET | Freq: Every day | ORAL | 2 refills | Status: DC

## 2023-05-08 MED ORDER — RYBELSUS 3 MG PO TABS: 1.0000 | ORAL_TABLET | Freq: Every day | ORAL | 0 refills | Status: DC

## 2023-05-08 MED ORDER — BLOOD GLUCOSE TEST VI STRP: 1.0000 | ORAL_STRIP | Freq: Three times a day (TID) | 0 refills | Status: DC

## 2023-05-08 MED ORDER — BLOOD GLUCOSE MONITORING SUPPL DEVI
1.0000 | Freq: Three times a day (TID) | 0 refills | Status: AC
Start: 2023-05-08 — End: ?

## 2023-05-08 NOTE — Assessment & Plan Note (Addendum)
BP today 151/83, repeat is.

## 2023-05-08 NOTE — Progress Notes (Unsigned)
   CC: diabetes follow up  HPI:  [name] is a 47 y.o. male living with a history stated below and presents today for diabetes follow up. Please see problem based assessment and plan for additional details.  Past Medical History:  Diagnosis Date   Diabetes mellitus 2002   Hypertension     Current Outpatient Medications on File Prior to Visit  Medication Sig Dispense Refill   benazepril (LOTENSIN) 40 MG tablet Take 1 tablet (40 mg total) by mouth daily. 90 tablet 3   Cinnamon 500 MG capsule Take 500 mg by mouth daily.     metFORMIN (GLUCOPHAGE) 1000 MG tablet Take 1 tablet (1,000 mg total) by mouth 2 (two) times daily with a meal. 60 tablet 11   ondansetron (ZOFRAN) 4 MG tablet Take 1 tablet (4 mg total) by mouth every 6 (six) hours. 12 tablet 0   ondansetron (ZOFRAN-ODT) 4 MG disintegrating tablet Take 1 tablet (4 mg total) by mouth every 8 (eight) hours as needed. 20 tablet 0   No current facility-administered medications on file prior to visit.   History reviewed. No pertinent family history.  Social History   Socioeconomic History   Marital status: Married    Spouse name: Not on file   Number of children: Not on file   Years of education: Not on file   Highest education level: Not on file  Occupational History   Not on file  Tobacco Use   Smoking status: Former    Current packs/day: 0.00    Average packs/day: 0.1 packs/day for 10.0 years (1.0 ttl pk-yrs)    Types: Cigars, Cigarettes    Start date: 12/21/2005    Quit date: 12/22/2015    Years since quitting: 7.3   Smokeless tobacco: Never   Tobacco comments:    Recently quit  Vaping Use   Vaping status: Never Used  Substance and Sexual Activity   Alcohol use: Yes    Comment: OccAsional   Drug use: Yes    Types: Marijuana    Comment: Occasionally.   Sexual activity: Not on file  Other Topics Concern   Not on file  Social History Narrative   Not on file   Social Determinants of Health   Financial  Resource Strain: Not on file  Food Insecurity: Not on file  Transportation Needs: Not on file  Physical Activity: Not on file  Stress: Not on file  Social Connections: Not on file  Intimate Partner Violence: Not on file   Review of Systems: ROS negative except for what is noted on the assessment and plan.  Vitals:   05/08/23 1616 05/08/23 1642  BP: (!) 151/83 (!) 143/77  Pulse: (!) 107 (!) 105  Temp: 99 F (37.2 C)   TempSrc: Oral   SpO2: 98%   Weight: 193 lb 9.6 oz (87.8 kg)   Height: 6\' 2"  (1.88 m)    Physical Exam: Constitutional: well appearing HENT: unremarkable Eyes: unremarkable Cardiovascular: regular rate and rhythm, no m/r/g Pulmonary/Chest: CTA bilaterally, normal respiratory effort Abdominal: soft, non-tender, non-distended MSK: normal bulk and tone Neurological: alert & oriented x 3, no focal deficits Skin: warm and dry Psych: normal mood and behavior  Assessment & Plan:   Patient {GC/GE:[id number]::"discussed with","seen with"} Dr. {[id number]::"UXLKGMWN","U. Hoffman","Mullen","Narendra","Vincent","Guilloud","Lau","Machen"}  Hypertension, essential BP today 151/83, repeat is.    Annett Fabian, MD  [facility] Internal Medicine, PGY-1 Phone: [phone] Date 05/08/2023 Time 5:01 PM

## 2023-05-08 NOTE — Patient Instructions (Addendum)
Thank you, [name] for allowing [place] to provide your care today. Today we discussed your diabetes and hypertension.    As we discussed, we collected your Hemoglobin A1c to evaluate your diabetes, which was significantly elevated >10. We will restart some medications you had prior to losing insurance: Jardiance 10 mg daily and Rybelsus 3 mg daily. Please keep taking metformin. You can stop taking the glipizide for now. I sent a glucometer to your pharmacy. Please check your fasting blood sugars regularly.   Your blood pressure was elevated today on both readings. Please check your blood pressure at home and log the values. Bring them with you to your next visit.   I have placed a referral for an eye exam. They should be contacting you to schedule an appointment.   I have also sent a prescription for Cialis to help with erectile dysfunction.   Follow up: 1 month  We look forward to seeing you next time. Please call our clinic at [phone] if you have any questions or concerns. The best time to call is Monday-Friday from 9am-4pm, but there is someone available 24/7. If after hours or the weekend, call the main hospital number and ask for the Internal Medicine Resident On-Call. If you need medication refills, please notify your pharmacy one week in advance and they will send [place] a request.   Thank you for trusting me with your care. Wishing you the best!   Annett Fabian, MD [facility]

## 2023-05-09 ENCOUNTER — Encounter: Payer: Self-pay | Admitting: Student

## 2023-05-09 NOTE — Telephone Encounter (Signed)
I called the pharmacy, they do not know the alternative medications the insurance company favors. Tried to call the patient x 2 with no response. Will try again tomorrow.

## 2023-05-09 NOTE — Assessment & Plan Note (Signed)
>>  ASSESSMENT AND PLAN FOR DIABETES MELLITUS TYPE 2, INSULIN  DEPENDENT (HCC) WRITTEN ON 05/09/2023  2:27 PM BY JUSTUS, MICHAEL, MD  He has poorly controlled type 2 diabetes with a hemoglobin A1c of 10.3. His last A1c was 10.9 in February. His current medication regimen is metformin  1000 mg twice daily and glipizide  5 mg twice daily.  He does not check his blood sugar regularly because he lost his glucometer, so I have sent a new one to his pharmacy.  He denies any current symptoms including polyuria, polydipsia, or neuropathy.  I have also placed referrals for an eye exam and for diabetes education, which I feel he will benefit from.  I spoke with him about starting insulin  given his high A1c, and he declined. He states he was previously on semaglutide  3 mg tabs daily and Jardiance  10 mg daily, but lost his insurance so he could no longer afford these medications.  He now has insurance again, so he would like to restart those medications. I have restarted his prior medications, Jardiance  10 mg and Rybelsus  3 mg daily, in addition to metformin . He preferred to stop the glipizide  for now, which I have done, though I think he may need it in the future. He is also due for a microalbumin/creatinine urine screen, but would prefer to wait until his next visit to collect it.

## 2023-05-09 NOTE — Assessment & Plan Note (Signed)
Previously taking Viagra, but said he does not like the way it makes him feel so would like to try something different.  I sent a prescription in for Cialis.

## 2023-05-09 NOTE — Assessment & Plan Note (Addendum)
He has poorly controlled type 2 diabetes with a hemoglobin A1c of 10.3. His last A1c was 10.9 in February. His current medication regimen is metformin 1000 mg twice daily and glipizide 5 mg twice daily.  He does not check his blood sugar regularly because he lost his glucometer, so I have sent a new one to his pharmacy.  He denies any current symptoms including polyuria, polydipsia, or neuropathy.  I have also placed referrals for an eye exam and for diabetes education, which I feel he will benefit from.  I spoke with him about starting insulin given his high A1c, and he declined. He states he was previously on semaglutide 3 mg tabs daily and Jardiance 10 mg daily, but lost his insurance so he could no longer afford these medications.  He now has insurance again, so he would like to restart those medications. I have restarted his prior medications, Jardiance 10 mg and Rybelsus 3 mg daily, in addition to metformin. He preferred to stop the glipizide for now, which I have done, though I think he may need it in the future. He is also due for a microalbumin/creatinine urine screen, but would prefer to wait until his next visit to collect it.

## 2023-05-15 ENCOUNTER — Telehealth: Payer: Self-pay

## 2023-05-15 NOTE — Telephone Encounter (Signed)
Decision:Approved  Carlos [name] (Key: [id number]) Rx #: [id number] Rybelsus 3MG  tablets Form [employer] [employer] Electronic Request Form Created Message from Plan Approved. . Authorization Expiration Date: May 14, 2024.

## 2023-05-15 NOTE — Progress Notes (Signed)
Internal Medicine Clinic Attending  I was physically present during the key portions of the resident provided service and participated in the medical decision making of patient's management care. I reviewed pertinent patient test results.  The assessment, diagnosis, and plan were formulated together and I agree with the documentation in the resident's note.  Lau, Grace, MD  

## 2023-05-15 NOTE — Telephone Encounter (Signed)
Prior Authorization for patient (Rybelsus 3MG  tablets) came through on cover my meds was submitted with last office notes and labs awaiting approval or denial.  KEY:[id number])

## 2023-05-15 NOTE — Addendum Note (Signed)
Addended by: Dickie La on: 05/15/2023 10:28 AM   Modules accepted: Level of Service

## 2023-05-16 NOTE — Telephone Encounter (Signed)
Please see note from yesterday 12/2. PA was submitted and has been approved.

## 2023-06-05 ENCOUNTER — Other Ambulatory Visit (HOSPITAL_COMMUNITY): Payer: Self-pay

## 2023-06-09 ENCOUNTER — Other Ambulatory Visit (HOSPITAL_COMMUNITY): Payer: Self-pay

## 2023-06-09 ENCOUNTER — Other Ambulatory Visit: Payer: Self-pay | Admitting: Student

## 2023-06-20 ENCOUNTER — Other Ambulatory Visit: Payer: Self-pay | Admitting: Student

## 2023-06-20 DIAGNOSIS — E119 Type 2 diabetes mellitus without complications: Secondary | ICD-10-CM

## 2023-06-20 NOTE — Telephone Encounter (Signed)
 Medication sent to pharmacy

## 2023-07-12 ENCOUNTER — Ambulatory Visit (INDEPENDENT_AMBULATORY_CARE_PROVIDER_SITE_OTHER): Payer: BLUE CROSS/BLUE SHIELD | Admitting: Internal Medicine

## 2023-07-12 VITALS — BP 147/84 | HR 97 | Temp 98.3°F | Wt 188.6 lb

## 2023-07-12 DIAGNOSIS — Z794 Long term (current) use of insulin: Secondary | ICD-10-CM

## 2023-07-12 DIAGNOSIS — Z0000 Encounter for general adult medical examination without abnormal findings: Secondary | ICD-10-CM

## 2023-07-12 DIAGNOSIS — I10 Essential (primary) hypertension: Secondary | ICD-10-CM | POA: Diagnosis not present

## 2023-07-12 DIAGNOSIS — Z860100 Personal history of colon polyps, unspecified: Secondary | ICD-10-CM

## 2023-07-12 DIAGNOSIS — E119 Type 2 diabetes mellitus without complications: Secondary | ICD-10-CM | POA: Diagnosis not present

## 2023-07-12 DIAGNOSIS — Z7984 Long term (current) use of oral hypoglycemic drugs: Secondary | ICD-10-CM

## 2023-07-12 MED ORDER — BENAZEPRIL-HYDROCHLOROTHIAZIDE 20-12.5 MG PO TABS: 1.0000 | ORAL_TABLET | Freq: Every day | ORAL | 11 refills | Status: DC

## 2023-07-12 NOTE — Progress Notes (Unsigned)
Subjective:  CC: follow-up  HPI:  [name] is a 48 y.o. male with a past medical history of hypertension and diabetes who presents today for follow-up.   Please see problem based assessment and plan for additional details.  Past Medical History:  Diagnosis Date   Diabetes mellitus 2002   Hypertension     MEDICATIONS:  Rybelsus 3 mg Metformin 1000 mg BID Jardiance 10 mg Benazepril 40 mg  Social History   Socioeconomic History   Marital status: Married    Spouse name: Not on file   Number of children: Not on file   Years of education: Not on file   Highest education level: Not on file  Occupational History   Not on file  Tobacco Use   Smoking status: Former    Current packs/day: 0.00    Average packs/day: 0.1 packs/day for 10.0 years (1.0 ttl pk-yrs)    Types: Cigars, Cigarettes    Start date: 12/21/2005    Quit date: 12/22/2015    Years since quitting: 7.5   Smokeless tobacco: Never   Tobacco comments:    Recently quit  Vaping Use   Vaping status: Never Used  Substance and Sexual Activity   Alcohol use: Yes    Comment: OccAsional   Drug use: Yes    Types: Marijuana    Comment: Occasionally.   Sexual activity: Not on file  Other Topics Concern   Not on file  Social History Narrative   Not on file   Social Drivers of Health   Financial Resource Strain: Not on file  Food Insecurity: No Food Insecurity (07/12/2023)   Hunger Vital Sign    Worried About Running Out of Food in the Last Year: Never true    Ran Out of Food in the Last Year: Never true  Transportation Needs: No Transportation Needs (07/12/2023)   PRAPARE - Administrator, Civil Service (Medical): No    Lack of Transportation (Non-Medical): No  Physical Activity: Not on file  Stress: Not on file  Social Connections: Not on file  Intimate Partner Violence: Not At Risk (07/12/2023)   Humiliation, Afraid, Rape, and Kick questionnaire    Fear of Current or Ex-Partner: No     Emotionally Abused: No    Physically Abused: No    Sexually Abused: No    Review of Systems: ROS negative except for what is noted on the assessment and plan.  Objective:   Vitals:   07/12/23 1518 07/12/23 1539  BP: (!) 150/78 (!) 147/84  Pulse: (!) 101 97  Temp: 98.3 F (36.8 C)   TempSrc: Oral   Weight: 188 lb 9.6 oz (85.5 kg)     Physical Exam: Constitutional: well-appearing, in no acute distress Cardiovascular: regular rate and rhythm, no m/r/g Pulmonary/Chest: normal work of breathing on room air, lungs clear to auscultation bilaterally Abdominal: soft, non-tender, non-distended MSK: normal bulk and tone Neurological: normal gait   Assessment & Plan:  Hypertension, essential Benazepril 40 mg He reports adherence with medication.  His blood pressure are elevated consistently in clinic today with systolics greater than 140s.  I want to strike a balance with limiting his pill burden and prioritizing his diabetes medications.  You think you will need 2 agents to control his blood pressures. P: Stop benazepril 40 mg Start benazepril-HCTZ 20-12.5 mg  Diabetes mellitus type 2, insulin dependent (HCC) Lab Results  Component Value Date   HGBA1C 10.3 (A) 05/08/2023  Medications include metformin 1000 mg  bid, rybelsus 3 mg and jardiance 10 mg. Rybelsus and jardiance added at recent office visit. Glipizide discontinued He is adherent with medications and not having side effects. P: Foot exam Continue metformin 1000 mg twice daily, Rybelsus 3 mg and Jardiance 10 mg. Follow-up in 1 month to repeat A1c.  Would titrate Jardiance at that visit  Healthcare maintenance Referral made to GI for colon cancer screening   Patient discussed with Dr. Hurshel Keys Shade Rivenbark, D.O. [facility]  PGY-3 Pager: [phone]  Phone: [phone] Date 07/13/2023  Time 6:42 PM

## 2023-07-12 NOTE — Patient Instructions (Signed)
Thank you, [name] for allowing [place] to provide your care today.   Diabetes I am glad you are tolerating your new diabetes medication.  Please continue taking Rybelsus, metformin, and Jardiance as you have been.  Follow-up in 1 month for repeat A1c.  We want your A1c less than 7 to prevent complications from diabetes.  Blood pressure Stop taking benazepril 40 mg.  I am sending in a new combination pill with benazepril and hydrochlorothiazide.  Please pick this up today or tomorrow and set the benazepril 40 mg to the side.  Come back in 4 weeks and we will recheck your blood pressure and get blood work for your kidneys.  We pay attention to your blood pressure as this can increase your risk for stroke  I have referred you to GI to repeat a colonoscopy.  Referrals ordered today:    Referral Orders         Ambulatory referral to Gastroenterology      I have ordered the following medication/changed the following medications:   Stop the following medications: Medications Discontinued During This Encounter  Medication Reason   benazepril (LOTENSIN) 40 MG tablet      Start the following medications: Meds ordered this encounter  Medications   benazepril-hydrochlorthiazide (LOTENSIN HCT) 20-12.5 MG tablet    Sig: Take 1 tablet by mouth daily.    Dispense:  30 tablet    Refill:  11     Follow up:  4 weeks    We look forward to seeing you next time. Please call our clinic at [phone] if you have any questions or concerns. The best time to call is Monday-Friday from 9am-4pm, but there is someone available 24/7. If after hours or the weekend, call the main hospital number and ask for the Internal Medicine Resident On-Call. If you need medication refills, please notify your pharmacy one week in advance and they will send [place] a request.   Thank you for trusting me with your care. Wishing you the best!   Rudene Christians, DO [facility]

## 2023-07-13 NOTE — Assessment & Plan Note (Signed)
>>  ASSESSMENT AND PLAN FOR DIABETES MELLITUS TYPE 2, INSULIN  DEPENDENT (HCC) WRITTEN ON 07/13/2023  6:41 PM BY MASTERS, KATIE, DO  Lab Results  Component Value Date   HGBA1C 10.3 (A) 05/08/2023  Medications include metformin  1000 mg bid, rybelsus  3 mg and jardiance  10 mg. Rybelsus  and jardiance  added at recent office visit. Glipizide  discontinued He is adherent with medications and not having side effects. P: Foot exam Continue metformin  1000 mg twice daily, Rybelsus  3 mg and Jardiance  10 mg. Follow-up in 1 month to repeat A1c.  Would titrate Jardiance  at that visit

## 2023-07-13 NOTE — Assessment & Plan Note (Signed)
Referral made to GI for colon cancer screening

## 2023-07-13 NOTE — Assessment & Plan Note (Signed)
Lab Results  Component Value Date   HGBA1C 10.3 (A) 05/08/2023  Medications include metformin 1000 mg bid, rybelsus 3 mg and jardiance 10 mg. Rybelsus and jardiance added at recent office visit. Glipizide discontinued He is adherent with medications and not having side effects. P: Foot exam Continue metformin 1000 mg twice daily, Rybelsus 3 mg and Jardiance 10 mg. Follow-up in 1 month to repeat A1c.  Would titrate Jardiance at that visit

## 2023-07-13 NOTE — Assessment & Plan Note (Signed)
Benazepril 40 mg He reports adherence with medication.  His blood pressure are elevated consistently in clinic today with systolics greater than 140s.  I want to strike a balance with limiting his pill burden and prioritizing his diabetes medications.  You think you will need 2 agents to control his blood pressures. P: Stop benazepril 40 mg Start benazepril-HCTZ 20-12.5 mg

## 2023-07-17 NOTE — Progress Notes (Signed)
 Internal Medicine Clinic Attending  Case discussed with the resident at the time of the visit.  We reviewed the resident's history and exam and pertinent patient test results.  I agree with the assessment, diagnosis, and plan of care documented in the resident's note.

## 2023-07-31 ENCOUNTER — Emergency Department (HOSPITAL_BASED_OUTPATIENT_CLINIC_OR_DEPARTMENT_OTHER): Payer: BLUE CROSS/BLUE SHIELD

## 2023-07-31 ENCOUNTER — Emergency Department (HOSPITAL_BASED_OUTPATIENT_CLINIC_OR_DEPARTMENT_OTHER)
Admission: EM | Admit: 2023-07-31 | Discharge: 2023-07-31 | Disposition: A | Discharge status: Home / Self Care | Payer: BLUE CROSS/BLUE SHIELD | Attending: Emergency Medicine | Admitting: Emergency Medicine

## 2023-07-31 ENCOUNTER — Telehealth: Payer: Self-pay | Admitting: *Deleted

## 2023-07-31 ENCOUNTER — Other Ambulatory Visit: Payer: Self-pay

## 2023-07-31 ENCOUNTER — Encounter (HOSPITAL_BASED_OUTPATIENT_CLINIC_OR_DEPARTMENT_OTHER): Payer: Self-pay | Admitting: Emergency Medicine

## 2023-07-31 DIAGNOSIS — R002 Palpitations: Secondary | ICD-10-CM | POA: Diagnosis not present

## 2023-07-31 DIAGNOSIS — E1165 Type 2 diabetes mellitus with hyperglycemia: Secondary | ICD-10-CM | POA: Insufficient documentation

## 2023-07-31 DIAGNOSIS — R0602 Shortness of breath: Secondary | ICD-10-CM | POA: Diagnosis present

## 2023-07-31 DIAGNOSIS — Z7984 Long term (current) use of oral hypoglycemic drugs: Secondary | ICD-10-CM | POA: Diagnosis not present

## 2023-07-31 DIAGNOSIS — I10 Essential (primary) hypertension: Secondary | ICD-10-CM | POA: Diagnosis not present

## 2023-07-31 LAB — CBC WITH DIFFERENTIAL/PLATELET
Abs Immature Granulocytes: 0.01 10*3/uL (ref 0.00–0.07)
Basophils Absolute: 0 10*3/uL (ref 0.0–0.1)
Basophils Relative: 0 %
Eosinophils Absolute: 0 10*3/uL (ref 0.0–0.5)
Eosinophils Relative: 0 %
HCT: 43.7 % (ref 39.0–52.0)
Hemoglobin: 15.2 g/dL (ref 13.0–17.0)
Immature Granulocytes: 0 %
Lymphocytes Relative: 19 %
Lymphs Abs: 1.4 10*3/uL (ref 0.7–4.0)
MCH: 32.6 pg (ref 26.0–34.0)
MCHC: 34.8 g/dL (ref 30.0–36.0)
MCV: 93.8 fL (ref 80.0–100.0)
Monocytes Absolute: 0.6 10*3/uL (ref 0.1–1.0)
Monocytes Relative: 7 %
Neutro Abs: 5.5 10*3/uL (ref 1.7–7.7)
Neutrophils Relative %: 74 %
Platelets: 308 10*3/uL (ref 150–400)
RBC: 4.66 MIL/uL (ref 4.22–5.81)
RDW: 13 % (ref 11.5–15.5)
WBC: 7.5 10*3/uL (ref 4.0–10.5)
nRBC: 0 % (ref 0.0–0.2)

## 2023-07-31 LAB — COMPREHENSIVE METABOLIC PANEL
ALT: 23 U/L (ref 0–44)
AST: 26 U/L (ref 15–41)
Albumin: 4.7 g/dL (ref 3.5–5.0)
Alkaline Phosphatase: 64 U/L (ref 38–126)
Anion gap: 20 — ABNORMAL HIGH (ref 5–15)
BUN: 22 mg/dL — ABNORMAL HIGH (ref 6–20)
CO2: 21 mmol/L — ABNORMAL LOW (ref 22–32)
Calcium: 9.5 mg/dL (ref 8.9–10.3)
Chloride: 90 mmol/L — ABNORMAL LOW (ref 98–111)
Creatinine, Ser: 1.23 mg/dL (ref 0.61–1.24)
GFR, Estimated: >60 mL/min (ref >60)
Glucose, Bld: 353 mg/dL — ABNORMAL HIGH (ref 70–99)
Potassium: 3.8 mmol/L (ref 3.5–5.1)
Sodium: 131 mmol/L — ABNORMAL LOW (ref 135–145)
Total Bilirubin: 1.4 mg/dL — ABNORMAL HIGH (ref 0.0–1.2)
Total Protein: 8.3 g/dL — ABNORMAL HIGH (ref 6.5–8.1)

## 2023-07-31 LAB — URINALYSIS, ROUTINE W REFLEX MICROSCOPIC
Bilirubin Urine: NEGATIVE
Glucose, UA: >=500 mg/dL — AB
Hgb urine dipstick: NEGATIVE
Ketones, ur: 80 mg/dL — AB
Leukocytes,Ua: NEGATIVE
Nitrite: NEGATIVE
Protein, ur: NEGATIVE mg/dL
Specific Gravity, Urine: 1.01 (ref 1.005–1.030)
pH: 5 (ref 5.0–8.0)

## 2023-07-31 LAB — BETA-HYDROXYBUTYRIC ACID: Beta-Hydroxybutyric Acid: 4.64 mmol/L — ABNORMAL HIGH (ref 0.05–0.27)

## 2023-07-31 LAB — I-STAT VENOUS BLOOD GAS, ED
Acid-base deficit: 2 mmol/L (ref 0.0–2.0)
Bicarbonate: 23.1 mmol/L (ref 20.0–28.0)
Calcium, Ion: 1.14 mmol/L — ABNORMAL LOW (ref 1.15–1.40)
HCT: 44 % (ref 39.0–52.0)
Hemoglobin: 15 g/dL (ref 13.0–17.0)
O2 Saturation: 83 %
Potassium: 3.5 mmol/L (ref 3.5–5.1)
Sodium: 132 mmol/L — ABNORMAL LOW (ref 135–145)
TCO2: 24 mmol/L (ref 22–32)
pCO2, Ven: 39.1 mm[Hg] — ABNORMAL LOW (ref 44–60)
pH, Ven: 7.379 (ref 7.25–7.43)
pO2, Ven: 48 mm[Hg] — ABNORMAL HIGH (ref 32–45)

## 2023-07-31 LAB — TROPONIN I (HIGH SENSITIVITY)
Troponin I (High Sensitivity): 15 ng/L (ref <18)
Troponin I (High Sensitivity): 16 ng/L (ref <18)

## 2023-07-31 LAB — BASIC METABOLIC PANEL
Anion gap: 13 (ref 5–15)
BUN: 18 mg/dL (ref 6–20)
CO2: 25 mmol/L (ref 22–32)
Calcium: 8.9 mg/dL (ref 8.9–10.3)
Chloride: 95 mmol/L — ABNORMAL LOW (ref 98–111)
Creatinine, Ser: 0.96 mg/dL (ref 0.61–1.24)
GFR, Estimated: >60 mL/min (ref >60)
Glucose, Bld: 190 mg/dL — ABNORMAL HIGH (ref 70–99)
Potassium: 3.7 mmol/L (ref 3.5–5.1)
Sodium: 133 mmol/L — ABNORMAL LOW (ref 135–145)

## 2023-07-31 LAB — URINALYSIS, MICROSCOPIC (REFLEX)

## 2023-07-31 LAB — CBG MONITORING, ED: Glucose-Capillary: 219 mg/dL — ABNORMAL HIGH (ref 70–99)

## 2023-07-31 LAB — TSH: TSH: 1.137 u[IU]/mL (ref 0.350–4.500)

## 2023-07-31 LAB — D-DIMER, QUANTITATIVE: D-Dimer, Quant: 0.29 ug{FEU}/mL (ref 0.00–0.50)

## 2023-07-31 MED ORDER — LACTATED RINGERS IV BOLUS
1000.0000 mL | Freq: Once | INTRAVENOUS | Status: AC
Start: 2023-07-31 — End: 2023-07-31
  Administered 2023-07-31: 1000 mL via INTRAVENOUS

## 2023-07-31 MED ORDER — LACTATED RINGERS IV BOLUS
1000.0000 mL | Freq: Once | INTRAVENOUS | Status: AC
  Administered 2023-07-31: 1000 mL via INTRAVENOUS

## 2023-07-31 NOTE — ED Provider Notes (Cosign Needed Addendum)
 [facility] Provider Note   CSN: [id number] Arrival date & time: 07/31/23  4098     History Chief Complaint  Patient presents with   Palpitations    Carlos [name] is a 48 y.o. male with history of hypertension, diabetes presents the emerged from today for evaluation of palpitations.  Reports that been happening for the past 2 to 3 days.  Reports that he will experience the palpitations for around 10 to 15 minutes and this happens around 2-3 times per day.  He reports that he feels the palpitations around 1 to 2 hours after taking his medications.  He drinks 220 ounce sodas that you have some caffeine but does not consume any energy drinks, additional supplements, or preworkout.  He reports that during these palpitations, he does feel mildly SOB, but denies chest pain. Not worsening on exertion. Recently saw a primary care doctor on 07-12-2023 and had his benazepril increased.  He is also on Jardiance, Rybelsus, metformin.  He reports that he was checking his blood pressure frequently yesterday and hit the high as it was as 190/80.  He denies any recent surgeries, history of DVT/PE, personal history of cancer, long travel, zosters hormone use, or any leg swelling.  Denies any headache, dizziness, lightheadedness, trouble walking or talking.  He reports that he did took some water given that he was having some of the symptoms today and immediately threw it up.  Otherwise is not having nausea or abdominal pain.  Wife reports he has been having more anxiety lately. She feels like these symptoms started after having a heated argument.    Palpitations Associated symptoms: shortness of breath and vomiting (1 episode)   Associated symptoms: no chest pain, no cough and no nausea        Home Medications Prior to Admission medications   Medication Sig Start Date End Date Taking? Authorizing Provider  benazepril-hydrochlorthiazide (LOTENSIN HCT) 20-12.5 MG  tablet Take 1 tablet by mouth daily. 07/12/23 07/11/24  Masters, Katie, DO  Blood Glucose Monitoring Suppl DEVI 1 each by Does not apply route in the morning, at noon, and at bedtime. May substitute to any manufacturer covered by patient's insurance. 05/08/23   Annett Fabian, MD  Cinnamon 500 MG capsule Take 500 mg by mouth daily.    [provider]  empagliflozin (JARDIANCE) 10 MG TABS tablet Take 1 tablet (10 mg total) by mouth daily before breakfast. 05/08/23   Annett Fabian, MD  glucose blood (ACCU-CHEK GUIDE TEST) test strip 1 EACH BY IN VITRO ROUTE IN THE MORNING, AT NOON, AND AT BEDTIME. MAY SUBSTITUTE TO ANY MANUFACTURER COVERED BY PATIENT'S INSURANCE. 06/20/23   Masters, Florentina Addison, DO  metFORMIN (GLUCOPHAGE) 1000 MG tablet Take 1 tablet (1,000 mg total) by mouth 2 (two) times daily with a meal. 07/20/22 07/20/23  Adron Bene, MD  ondansetron (ZOFRAN) 4 MG tablet Take 1 tablet (4 mg total) by mouth every 6 (six) hours. 02/02/22   Janell Quiet, PA-C  ondansetron (ZOFRAN-ODT) 4 MG disintegrating tablet Take 1 tablet (4 mg total) by mouth every 8 (eight) hours as needed. 04/07/23   Jeanelle Malling, PA  RYBELSUS 3 MG TABS TAKE 1 TABLET BY MOUTH DAILY 05/18/23   Masters, Florentina Addison, DO  tadalafil (CIALIS) 10 MG tablet Take 1 tablet (10 mg total) by mouth as needed for erectile dysfunction. 05/08/23 06/07/23  Annett Fabian, MD      Allergies    Penicillins and Sitagliptin-metformin hcl  Review of Systems   Review of Systems  Constitutional:  Negative for chills and fever.  HENT:  Negative for congestion and rhinorrhea.   Respiratory:  Positive for shortness of breath. Negative for cough.   Cardiovascular:  Positive for palpitations. Negative for chest pain and leg swelling.  Gastrointestinal:  Positive for vomiting (1 episode). Negative for abdominal pain, constipation, diarrhea and nausea.  Genitourinary:  Negative for dysuria and hematuria.  Musculoskeletal:  Negative for gait problem.   Neurological:  Negative for syncope, speech difficulty, light-headedness and headaches.    Physical Exam Updated Vital Signs BP 136/88 (BP Location: Right Arm)   Pulse (!) 113   Temp 98.1 F (36.7 C) (Oral)   Resp (!) 21   Ht 6\' 2"  (1.88 m)   Wt 80.7 kg   SpO2 100%   BMI 22.85 kg/m  Physical Exam Vitals and nursing note reviewed.  Constitutional:      General: He is not in acute distress.    Appearance: He is not ill-appearing or toxic-appearing.  HENT:     Mouth/Throat:     Mouth: Mucous membranes are moist.  Eyes:     General: No scleral icterus. Cardiovascular:     Rate and Rhythm: Tachycardia present.     Pulses: Normal pulses.  Pulmonary:     Effort: Pulmonary effort is normal. No respiratory distress.     Breath sounds: Normal breath sounds.  Abdominal:     Tenderness: There is no abdominal tenderness. There is no guarding or rebound.  Musculoskeletal:     Right lower leg: No edema.     Left lower leg: No edema.  Skin:    General: Skin is warm and dry.  Neurological:     General: No focal deficit present.     Mental Status: He is alert.     ED Results / Procedures / Treatments   Labs (all labs ordered are listed, but only abnormal results are displayed) Labs Reviewed  COMPREHENSIVE METABOLIC PANEL - Abnormal; Notable for the following components:      Result Value   Sodium 131 (*)    Chloride 90 (*)    CO2 21 (*)    Glucose, Bld 353 (*)    BUN 22 (*)    Total Protein 8.3 (*)    Total Bilirubin 1.4 (*)    Anion gap 20 (*)    All other components within normal limits  CBC WITH DIFFERENTIAL/PLATELET  TSH  D-DIMER, QUANTITATIVE  TROPONIN I (HIGH SENSITIVITY)    EKG EKG Interpretation Date/Time:  Monday July 31 2023 10:07:13 EST Ventricular Rate:  115 PR Interval:  129 QRS Duration:  93 QT Interval:  324 QTC Calculation: 449 R Axis:   110  Text Interpretation: Sinus tachycardia Right atrial enlargement similar to prior, likely repol  Confirmed by Tanda Rockers ([id number]) on 07/31/2023 10:24:23 AM  Radiology No results found.  Procedures Procedures   Medications Ordered in ED Medications  lactated ringers bolus 1,000 mL (0 mLs Intravenous Stopped 07/31/23 1324)  lactated ringers bolus 1,000 mL (0 mLs Intravenous Stopped 07/31/23 1516)    ED Course/ Medical Decision Making/ A&P                                Medical Decision Making Amount and/or Complexity of Data Reviewed Labs: ordered. Radiology: ordered.   48 y.o. male presents to the ER for evaluation of palptiations. Differential diagnosis includes  but is not limited to Cardiac arrhythmias, ACS, CHF, pericarditis, valvular disease, panic/anxiety, ETOH, stimulant use, medication side effect, anemia, hyperthyroidism, pulmonary embolism. Vital signs HR 108, otherwise unremarkable. BP within normal limits. Physical exam as noted above.   I independently reviewed and interpreted the patient's labs.  Troponin at 15 with repeat at 16.  D-dimer within normal limits.  CBC without leukocytosis or anemia.  CMP does show sodium 131 however pseudohyponatremia given glucose is at 343.  Chloride at 90 with a bicarb of 21 and a gap of 20.  BUN at 22 with a creatinine at baseline.  Mildly elevated total protein 8.3 and mildly elevated total bilirubin at 1.4 otherwise, there is no other electrolyte or LFT abnormalities.  VBG ordered given patient's elevated blood glucose, pH is at 7.37.  Urinalysis shows 80 ketones with greater than 500 glucose, no signs of infection.  TSH pending.  Beta-hydroxybutyrate acid elevated at 4.64.  EKG reviewed and interpreted by my attending and read as inus tachycardia Right atrial enlargement similar to prior, likely repol.  CXR shows no acute cardiopulmonary process. Per radiologist's interpretation.    Given the patient's hyperglycemia, did order 1 L of lactated Ringer's.  Concern given the anion gap, elevated glucose, as well as ketones in the urine  however his pH is 7.37.  Discussed with my attending.  Think this more hyperglycemia does not macrotear for DKA.  Recommends additional liter of fluid.  Troponins are flat, EKG unchanged from previous.  Given the D-dimer within normal limits as well, I think ACS, PE, dissection or aneurysm is less likely.  Could be having palpitations from some anxiety/hyperglycemia/dehydration.  Patient's glucose is now at 190.  We repeated BMP and shows a normal anion gap.  We discussed making better food choices.  Discussed checking his blood sugars.  He reports he will follow-up with his PCP in the next few days for better management of his diabetes.  I have also sent to the ambulatory follow-up for his palpitations.  From previous chart evaluation, it appears that patient is always in the high 90s/low 100s as a baseline.  He has not felt these palpitations since being here. His blood pressure is within normal limits.  I think his elevated blood pressure is likely due to some anxiety.  Patient and family were feel appropriate for discharge.  They verbalized their understanding.  We discussed the results of the labs/imaging. The plan is . We discussed strict return precautions and red flag symptoms. The patient verbalized their understanding and agrees to the plan. The patient is stable and being discharged home in good condition.  Portions of this report may have been transcribed using voice recognition software. Every effort was made to ensure accuracy; however, inadvertent computerized transcription errors may be present.    I discussed this case with my attending physician who cosigned this note including patient's presenting symptoms, physical exam, and planned diagnostics and interventions. Attending physician stated agreement with plan or made changes to plan which were implemented.   Final Clinical Impression(s) / ED Diagnoses Final diagnoses:  Uncontrolled type 2 diabetes mellitus with hyperglycemia [facility])   Palpitations    Rx / DC Orders ED Discharge Orders     None         Achille Rich, PA-C 07/31/23 1525    Achille Rich, PA-C 07/31/23 1525    Sloan Leiter, DO 08/02/23 2113

## 2023-07-31 NOTE — ED Triage Notes (Signed)
 States his pressure is  high last 3 - 4 days had a change in his meds  3weeks ago  called his dr and has app on wed

## 2023-07-31 NOTE — Telephone Encounter (Signed)
 Call from patient was transferred from the front.  Stated when asked what could I do for him .  He stated I already told someone what I needed.  He the proceeded to tell me that his blood pressure medication had been changed to a Combination medication and that his blood pressures were still elevated.  When asked how high he stated 190/90.   Gave no other readings.  When told that the earliest appointment that we have is on Wednesday he then stated that he would just go to an Urgent Care and then hung up.

## 2023-07-31 NOTE — Discharge Instructions (Addendum)
 You were seen in the ER today for evaluation of your palpitations.  This may have been caused by your elevated sugar and dehydration.  I am glad that you are feeling a lot better.  Please make sure to watch your diet and make good choices.  You need to follow-up your primary care doctor in the next few days to reevaluate your medications for your diabetes.  I have also sent in an [facility] referral for your palpitations to cardiology.  They should call you within the next few days to schedule an appointment, however if they do not, please make sure to call to schedule.  I have included more information into the discharge report for you to review.  If you have any other concerns, new or worsening symptoms, please return to your nearest emerged part for reevaluation. Make sure you are checking your sugars at home!  Contact a health care provider if: You have diabetes and have trouble keeping your blood sugar in the right range. Your blood sugar is at or above 240 mg/dL (09.8 mmol/L) for 2 days in a row. You have high blood sugar often. You have signs of illness, such as: Nausea or vomiting. A headache. A fever. You can't stop vomiting. Get help right away if: Your blood sugar monitor reads "high" even when you're taking insulin. You have trouble breathing. These symptoms may be an emergency. Call 911 right away. Do not wait to see if the symptoms will go away. Do not drive yourself to the hospital.

## 2023-08-03 ENCOUNTER — Ambulatory Visit: Payer: BLUE CROSS/BLUE SHIELD

## 2023-08-03 ENCOUNTER — Encounter (HOSPITAL_BASED_OUTPATIENT_CLINIC_OR_DEPARTMENT_OTHER): Payer: Self-pay | Admitting: *Deleted

## 2023-08-03 NOTE — Progress Notes (Deleted)
 Cardiology Consultation:    Date:  08/03/2023   ID:  Carlos [name], DOB [birth year], MRN [id number]  PCP:  Carlos Christians, DO  Cardiologist:  Carlos Corporal Shawnda Mauney, MD   Referring MD: Carlos Rich, PA-C   No chief complaint on file.    ASSESSMENT AND PLAN:   Mr. Carlos [name] 48 year old male Problem List Items Addressed This Visit   None     History of Present Illness:    Carlos [name] is a 47 y.o. male who is being seen today for the evaluation of palpitations at the request of Carlos [name], [place].   Has history of hypertension, diabetes, anxiety,  With recent symptoms of palpitations, had a visit to the emergency room on 07-31-2023, hemodynamically he was stable with mildly elevated blood pressures 136/88 mmHg, sinus tachycardia 113/min, EKG with sinus tachycardia, mild hyponatremia sodium 131, high-sensitivity troponin I 15 and 16, D-dimer was within normal limits, elevated glucose 343, BUN 22, urinalysis significant for ketones and glucose.     Recent EKG from ER visit 07-31-2023 noted sinus tachycardia heart rate 115/min, normal PR interval 129 ms, QRS duration 93 ms, normal axis, nonspecific ST depression with T wave inversion in inferior leads and T wave changes in lateral precordial leads.  Previous EKG from February 04, 2022 showed normal heart rate sinus 97/min without any significant ST-T wave changes.  Past Medical History:  Diagnosis Date   Diabetes mellitus 2002   Hypertension     Past Surgical History:  Procedure Laterality Date   FEMUR FRACTURE SURGERY     right   INCISION AND DRAINAGE PERIRECTAL ABSCESS N/A 01/02/2016   Procedure: IRRIGATION AND DEBRIDEMENT PERIRECTAL ABSCESS;  Surgeon: Axel Filler, MD;  Location: [facility] OR;  Service: General;  Laterality: N/A;    Current Medications: No outpatient medications have been marked as taking for the 08/03/23 encounter (Appointment) with Carlos [name], Carlos Corporal, MD.     Allergies:   Penicillins and  Sitagliptin-metformin hcl   Social History   Socioeconomic History   Marital status: Married    Spouse name: Not on file   Number of children: Not on file   Years of education: Not on file   Highest education level: Not on file  Occupational History   Not on file  Tobacco Use   Smoking status: Every Day    Current packs/day: 0.00    Average packs/day: 0.1 packs/day for 10.0 years (1.0 ttl pk-yrs)    Types: Cigars, Cigarettes    Start date: 12/21/2005    Last attempt to quit: 12/22/2015    Years since quitting: 7.6   Smokeless tobacco: Never   Tobacco comments:    Recently quit  Vaping Use   Vaping status: Never Used  Substance and Sexual Activity   Alcohol use: Yes    Comment: OccAsional   Drug use: Yes    Types: Marijuana    Comment: Occasionally.   Sexual activity: Not on file  Other Topics Concern   Not on file  Social History Narrative   Not on file   Social Drivers of Health   Financial Resource Strain: Not on file  Food Insecurity: No Food Insecurity (07/12/2023)   Hunger Vital Sign    Worried About Running Out of Food in the Last Year: Never true    Ran Out of Food in the Last Year: Never true  Transportation Needs: No Transportation Needs (07/12/2023)   PRAPARE - Transportation    Lack of Transportation (Medical): No    Lack  of Transportation (Non-Medical): No  Physical Activity: Not on file  Stress: Not on file  Social Connections: Not on file     Family History: The patient's family history is not on file. ROS:   Please see the history of present illness.    All 14 point review of systems negative except as described per history of present illness.  EKGs/Labs/Other Studies Reviewed:    The following studies were reviewed today:   EKG:       Recent Labs: 07/31/2023: ALT 23; BUN 18; Creatinine, Ser 0.96; Hemoglobin 15.0; Platelets 308; Potassium 3.7; Sodium 133; TSH 1.137  Recent Lipid Panel    Component Value Date/Time   CHOL 191 12/13/2021  1624   TRIG 207 (H) 12/13/2021 1624   HDL 56 12/13/2021 1624   CHOLHDL 3.4 12/13/2021 1624   LDLCALC 100 (H) 12/13/2021 1624    Physical Exam:    VS:  There were no vitals taken for this visit.    Wt Readings from Last 3 Encounters:  07/31/23 178 lb (80.7 kg)  07/12/23 188 lb 9.6 oz (85.5 kg)  05/08/23 193 lb 9.6 oz (87.8 kg)     GENERAL:  Well nourished, well developed in no acute distress NECK: No JVD; No carotid bruits CARDIAC: RRR, S1 and S2 present, no murmurs, no rubs, no gallops CHEST:  Clear to auscultation without rales, wheezing or rhonchi  Extremities: No pitting pedal edema. Pulses bilaterally symmetric with radial 2+ and dorsalis pedis 2+ NEUROLOGIC:  Alert and oriented x 3  Medication Adjustments/Labs and Tests Ordered: Current medicines are reviewed at length with the patient today.  Concerns regarding medicines are outlined above.  No orders of the defined types were placed in this encounter.  No orders of the defined types were placed in this encounter.   Signed, Cecille Amsterdam, MD, MPH, [facility]. 08/03/2023 3:19 PM    [facility]

## 2023-08-07 ENCOUNTER — Other Ambulatory Visit (HOSPITAL_COMMUNITY): Payer: Self-pay

## 2023-08-07 ENCOUNTER — Telehealth: Payer: Self-pay

## 2023-08-07 NOTE — Telephone Encounter (Signed)
 Pt states he cannot afford to pickup his Jardiance, it will cost $600.00. Wanting to know what should he do, please call pt back. Pt states the last time he had this Rx, it only cost him $18.00.

## 2023-08-07 NOTE — Telephone Encounter (Signed)
 Call to Patient's pharmacy. Patient perhaps has a Co-pay that has not been met.  Patient was asked to call his insurance company to see if he has a co-pay.  He will then have to call his insurance to work out a Insurance claims handler.

## 2023-08-07 NOTE — Telephone Encounter (Signed)
 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  He will call his insurance company to see idf this is his deductible and ask for a payment plan on his deductible.

## 2023-08-14 ENCOUNTER — Other Ambulatory Visit: Payer: Self-pay

## 2023-08-14 DIAGNOSIS — E119 Type 2 diabetes mellitus without complications: Secondary | ICD-10-CM

## 2023-08-14 DIAGNOSIS — I10 Essential (primary) hypertension: Secondary | ICD-10-CM

## 2023-08-14 MED ORDER — BENAZEPRIL-HYDROCHLOROTHIAZIDE 20-12.5 MG PO TABS: 1.0000 | ORAL_TABLET | Freq: Every day | ORAL | 11 refills | Status: AC

## 2023-08-14 MED ORDER — RYBELSUS 3 MG PO TABS: 1.0000 | ORAL_TABLET | Freq: Every day | ORAL | 0 refills | Status: DC

## 2023-08-14 MED ORDER — METFORMIN HCL 1000 MG PO TABS: 1000.0000 mg | ORAL_TABLET | Freq: Two times a day (BID) | ORAL | 11 refills | Status: DC

## 2023-08-28 ENCOUNTER — Encounter: Payer: BLUE CROSS/BLUE SHIELD | Admitting: Internal Medicine

## 2023-09-18 ENCOUNTER — Ambulatory Visit: Payer: BLUE CROSS/BLUE SHIELD

## 2023-09-27 ENCOUNTER — Ambulatory Visit: Payer: Self-pay | Admitting: Student

## 2023-09-28 ENCOUNTER — Ambulatory Visit: Payer: Self-pay | Admitting: Student

## 2023-10-02 ENCOUNTER — Ambulatory Visit: Payer: Self-pay | Admitting: Internal Medicine

## 2023-10-02 VITALS — BP 137/91 | HR 84 | Temp 98.4°F | Ht 74.0 in | Wt 180.6 lb

## 2023-10-02 DIAGNOSIS — E1165 Type 2 diabetes mellitus with hyperglycemia: Secondary | ICD-10-CM

## 2023-10-02 DIAGNOSIS — Z7985 Long-term (current) use of injectable non-insulin antidiabetic drugs: Secondary | ICD-10-CM

## 2023-10-02 DIAGNOSIS — E785 Hyperlipidemia, unspecified: Secondary | ICD-10-CM

## 2023-10-02 DIAGNOSIS — I10 Essential (primary) hypertension: Secondary | ICD-10-CM

## 2023-10-02 DIAGNOSIS — E119 Type 2 diabetes mellitus without complications: Secondary | ICD-10-CM

## 2023-10-02 DIAGNOSIS — Z7984 Long term (current) use of oral hypoglycemic drugs: Secondary | ICD-10-CM

## 2023-10-02 LAB — POCT GLYCOSYLATED HEMOGLOBIN (HGB A1C): Hemoglobin A1C: 12.1 % — AB (ref 4.0–5.6)

## 2023-10-02 LAB — GLUCOSE, CAPILLARY: Glucose-Capillary: 298 mg/dL — ABNORMAL HIGH (ref 70–99)

## 2023-10-02 MED ORDER — PRAVASTATIN SODIUM 20 MG PO TABS: 20.0000 mg | ORAL_TABLET | Freq: Every day | ORAL | 2 refills | Status: DC

## 2023-10-02 MED ORDER — LIRAGLUTIDE 18 MG/3ML ~~LOC~~ SOPN: 0.6000 mg | PEN_INJECTOR | Freq: Every day | SUBCUTANEOUS | 1 refills | Status: DC

## 2023-10-02 MED ORDER — AMLODIPINE BESYLATE 5 MG PO TABS: 5.0000 mg | ORAL_TABLET | Freq: Every day | ORAL | 11 refills | Status: AC

## 2023-10-02 MED ORDER — GLYBURIDE-METFORMIN 5-500 MG PO TABS: 2.0000 | ORAL_TABLET | Freq: Two times a day (BID) | ORAL | 2 refills | Status: DC

## 2023-10-02 NOTE — Assessment & Plan Note (Signed)
 Not currently on any medications as outpatient. Lipid panel last checked 12/2021 with LDL at goal, 100.  Plan:Will restart pravastatin  today, plan for repeat lipid panel in about 3 months.

## 2023-10-02 NOTE — Progress Notes (Signed)
 CC: routine check up  HPI:  [name] is a 48 y.o. male with past medical history as detailed below who presents for routine check up. Please see problem based charting for detailed assessment and plan.   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  Has been on insulin  in the past, but discontinued due to not liking injecting insulin .     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  Physical Exam:  Vitals:   10/02/23 0948 10/02/23 1015  BP: (!) 140/85 (!) 137/91  Pulse: 85 84  Temp: 98.4 F (36.9 C)   TempSrc: Oral   SpO2: 99%   Weight: 180 lb 9.6 oz (81.9 kg)   Height: 6\' 2"  (1.88 m)    Constitutional:Appears stated age, well. In no acute distress. Cardio:Regular rate and rhythm. No murmurs, rubs, or gallops. Pulm:Clear to auscultation bilaterally. Normal work of breathing on room air. JYN:WGNFAOZH for extremity edema. Skin:Warm and dry. Neuro:Alert and oriented x3. No focal deficit noted. Psych:Pleasant mood and affect.  Assessment & Plan:   See Encounters Tab for problem based charting.  Type 2 diabetes mellitus with  hyperglycemia, without long-term current use of insulin  (HCC) HbA1c last checked 04/2023 10.3%, today 12.1%. OP regimen is jardiance  10 mg daily, metformin  1000 mg BID, semaglutide  3 mg PO daily, however he has been out of rybelsus  and jardiance  for at least one month. He has been unable to afford refills. He does not regularly check his blood sugar at home, but does when he feels very poorly and in those instances it is >300. Urine microalbumin/creatinine ratio not previously checked Plan: Regimen limited by affordability and insurance formularies; based on my review of his insurance formularies, will start glyburide -metformin  03-999 mg BID, liraglutide  0.6 mg injection weekly. F/u in 4 weeks for further titration of liraglutide  and BMP; would consider starting SGLT2 at that time. I am hopeful that he will be able to confirm which insurance is his new servicer at that time so that we are better able to prescribe medication for him. Urine microalbumin/creatinine ratio today. Refer to ophthalmology once his insurance has kicked in.  Hyperlipidemia Not currently on any medications as outpatient. Lipid panel last checked 12/2021 with LDL at goal, 100.  Plan:Will restart pravastatin  today, plan for repeat lipid panel in about 3 months.  Hypertension, essential BP 140/85, on recheck 137/91. OP regimen is benazepril -hydrochlorothiazide  20-12.5 mg daily which he is compliant with and took it today. He does not check his BP at home regularly. BMP last checked 07/2023 with renal function WNL. Plan:Continue benazepril -hydrochlorothiazide  20-12.5 mg daily. Will start amlodipine  5 mg daily. F/u in 4 weeks for BP check.  Patient discussed with Dr.  Chambliss

## 2023-10-02 NOTE — Patient Instructions (Addendum)
 Mr. Knotts,  It was a pleasure to care for you today!  Please note the following medicine changes: For your diabetes: - STOP metformin  1000 mg twice daily - START glyburide -metformin  03-999 mg twice daily - START liraglutide  0.6 mg injection once weekly  For your cholesterol: - START pravastatin  20 mg daily  For your blood pressure: - START amlodipine  5 mg daily - CONTINUE benazepril -hydrochlorothiazide  20-12.5 mg daily   Please follow up in 4 weeks for blood pressure recheck and to further adjust your diabetes medications. Make sure to bring your insurance card at that time!  My best, Dr. Rozelle Corning

## 2023-10-02 NOTE — Assessment & Plan Note (Signed)
 BP 140/85, on recheck 137/91. OP regimen is benazepril -hydrochlorothiazide  20-12.5 mg daily which he is compliant with and took it today. He does not check his BP at home regularly. BMP last checked 07/2023 with renal function WNL. Plan:Continue benazepril -hydrochlorothiazide  20-12.5 mg daily. Will start amlodipine  5 mg daily. F/u in 4 weeks for BP check.

## 2023-10-02 NOTE — Assessment & Plan Note (Signed)
 HbA1c last checked 04/2023 10.3%, today 12.1%. OP regimen is jardiance  10 mg daily, metformin  1000 mg BID, semaglutide  3 mg PO daily, however he has been out of rybelsus  and jardiance  for at least one month. He has been unable to afford refills. He does not regularly check his blood sugar at home, but does when he feels very poorly and in those instances it is >300. Urine microalbumin/creatinine ratio not previously checked Plan: Regimen limited by affordability and insurance formularies; based on my review of his insurance formularies, will start glyburide -metformin  03-999 mg BID, liraglutide  0.6 mg injection weekly. F/u in 4 weeks for further titration of liraglutide  and BMP; would consider starting SGLT2 at that time. I am hopeful that he will be able to confirm which insurance is his new servicer at that time so that we are better able to prescribe medication for him. Urine microalbumin/creatinine ratio today. Refer to ophthalmology once his insurance has kicked in.

## 2023-10-03 LAB — MICROALBUMIN / CREATININE URINE RATIO
Creatinine, Urine: 76.7 mg/dL
Microalb/Creat Ratio: 9 mg/g{creat} (ref 0–29)
Microalbumin, Urine: 6.8 ug/mL

## 2023-10-03 NOTE — Progress Notes (Signed)
 Internal Medicine Clinic Attending  Case discussed with the resident at the time of the visit.  We reviewed the resident's history and exam and pertinent patient test results.  I agree with the assessment, diagnosis, and plan of care documented in the resident's note.

## 2023-10-04 ENCOUNTER — Encounter: Payer: Self-pay | Admitting: Internal Medicine

## 2023-10-24 ENCOUNTER — Other Ambulatory Visit: Payer: Self-pay | Admitting: Internal Medicine

## 2023-10-24 ENCOUNTER — Telehealth: Payer: Self-pay

## 2023-10-24 DIAGNOSIS — E119 Type 2 diabetes mellitus without complications: Secondary | ICD-10-CM

## 2023-10-24 NOTE — Telephone Encounter (Signed)
 Copied from CRM [id number]. Topic: Clinical - Medication Refill >> Oct 24, 2023 11:45 AM Lenon Radar A wrote: Medication: liraglutide  (VICTOZA ) 18 MG/3ML SOPN   Has the patient contacted their pharmacy? Yes (Agent: If no, request that the patient contact the pharmacy for the refill. If patient does not wish to contact the pharmacy document the reason why and proceed with request.) (Agent: If yes, when and what did the pharmacy advise?)  Patient was advised by pharmacy that medication requires prior auth. Advised patient of refill timeframe. Patient understood.   This is the patient's preferred pharmacy:   [id number] - [place], [place] - [address] [facility] [address]OSS [facility] [address] [place] [address] Phone: [phone] Fax: [phone]  Is this the correct pharmacy for this prescription? Yes If no, delete pharmacy and type the correct one.   Has the prescription been filled recently? No  Is the patient out of the medication? Yes  Has the patient been seen for an appointment in the last year OR does the patient have an upcoming appointment? Yes  Can we respond through MyChart? Yes  Agent: Please be advised that Rx refills may take up to 3 business days. We ask that you follow-up with your pharmacy.

## 2023-10-24 NOTE — Telephone Encounter (Signed)
 Carlos [name] (Key: Mellody Sprout) PA Case ID #: [id number] Rx #: [id number] Need Help? Call us  at [phone] Outcome Approved today by [employer] 2017 NCPDP Request Reference Number: [id number]. LIRAGLUTIDE  INJ 18MG /3ML is approved through 10/23/2024. Your patient may now fill this prescription and it will be covered. Effective Date: 10/24/2023 Authorization Expiration Date: 10/23/2024 Drug Liraglutide  18MG Flora Humphreys pen-injectors ePA cloud logo Form [employer] Electronic Prior Authorization Form (2017 NCPDP) Original Claim Info 55 Drug Requires Prior Authorization

## 2023-10-24 NOTE — Telephone Encounter (Signed)
 Prior Authorization for patient (Liraglutide  18MG /3ML pen-injectors) came through on cover my meds was submitted with last office notes and labs awaiting approval or denial.  KEY:[id number]

## 2023-10-25 NOTE — Telephone Encounter (Signed)
 PA has already been completed and approved. Please see note from 10/24/23.

## 2023-11-07 ENCOUNTER — Ambulatory Visit: Payer: Self-pay

## 2023-11-07 NOTE — Telephone Encounter (Signed)
 Pt as an appt Friday 5/30 with Dr Isabell Manzanilla.

## 2023-11-07 NOTE — Telephone Encounter (Signed)
 Copied from CRM [id number]. Topic: Clinical - Red Word Triage >> Nov 07, 2023 11:49 AM Carlos [name] wrote: Red Word that prompted transfer to Nurse Triage:Patient has been taking the medication:   liraglutide  (VICTOZA ) 18 MG/3ML SOPN  , daily and is not experience Body rash/bumps    Chief Complaint: Rash to arms, legs,small raised bumps. Itchy. Feels like it's from the Victoza . Symptoms: above Frequency: yesterday Pertinent Negatives: Patient denies  Disposition: [] ED /[] Urgent Care (no appt availability in office) / [x] Appointment(In office/virtual)/ []  [facility]/ [] Home Care/ [] Refused Recommended Disposition /[] [facility]/ []  Follow-up with PCP Additional Notes: agrees with appointment.  Reason for Disposition  Mild widespread rash  (Exception: Heat rash lasting 3 days or less.)  Answer Assessment - Initial Assessment Questions 1. APPEARANCE of RASH: "Describe the rash." (e.[name]., spots, blisters, raised areas, skin peeling, scaly)     Raised bumps 2. SIZE: "How big are the spots?" (e.[name]., tip of pen, eraser, coin; inches, centimeters)     Small 3. LOCATION: "Where is the rash located?"     Legs,arms 4. COLOR: "What color is the rash?" (Note: It is difficult to assess rash color in people with darker-colored skin. When this situation occurs, simply ask the caller to describe what they see.)     N/a 5. ONSET: "When did the rash begin?"     Last night 6. FEVER: "Do you have a fever?" If Yes, ask: "What is your temperature, how was it measured, and when did it start?"     no 7. ITCHING: "Does the rash itch?" If Yes, ask: "How bad is the itch?" (Scale 1-10; or mild, moderate, severe)     moderate 8. CAUSE: "What do you think is causing the rash?"     Maybe Victoza  9. MEDICINE FACTORS: "Have you started any new medicines within the last 2 weeks?" (e.[name]., antibiotics)      yes 10. OTHER SYMPTOMS: "Do you have any other symptoms?" (e.[name]., dizziness, headache, sore  throat, joint pain)       dizzy 11. PREGNANCY: "Is there any chance you are pregnant?" "When was your last menstrual period?"       Dizzy 2 days ago  Protocols used: Rash or Redness - [facility]

## 2023-11-10 ENCOUNTER — Ambulatory Visit: Payer: Self-pay | Admitting: Student

## 2023-11-10 ENCOUNTER — Telehealth: Payer: Self-pay

## 2023-11-10 VITALS — BP 121/70 | HR 87 | Temp 98.4°F | Ht 74.0 in | Wt 183.4 lb

## 2023-11-10 DIAGNOSIS — L739 Follicular disorder, unspecified: Secondary | ICD-10-CM

## 2023-11-10 DIAGNOSIS — E119 Type 2 diabetes mellitus without complications: Secondary | ICD-10-CM

## 2023-11-10 DIAGNOSIS — Z7984 Long term (current) use of oral hypoglycemic drugs: Secondary | ICD-10-CM

## 2023-11-10 DIAGNOSIS — E1165 Type 2 diabetes mellitus with hyperglycemia: Secondary | ICD-10-CM

## 2023-11-10 DIAGNOSIS — Z7985 Long-term (current) use of injectable non-insulin antidiabetic drugs: Secondary | ICD-10-CM

## 2023-11-10 MED ORDER — CLINDAMYCIN PHOS (TWICE-DAILY) 1 % EX GEL: Freq: Two times a day (BID) | CUTANEOUS | 0 refills | Status: DC

## 2023-11-10 MED ORDER — PEN NEEDLES 32G X 4 MM MISC: 1.0000 | 3 refills | Status: AC

## 2023-11-10 MED ORDER — EMPAGLIFLOZIN 10 MG PO TABS: 10.0000 mg | ORAL_TABLET | Freq: Every day | ORAL | 2 refills | Status: AC

## 2023-11-10 MED ORDER — SEMAGLUTIDE(0.25 OR 0.5MG/DOS) 2 MG/3ML ~~LOC~~ SOPN: 0.2500 mg | PEN_INJECTOR | SUBCUTANEOUS | 0 refills | Status: AC

## 2023-11-10 NOTE — Assessment & Plan Note (Signed)
 Rash that started after Victoza . Reassured pt that it is unlikely related to Victoza  but that diabetes may predispose him to this. He has erythematous papules that surround the hair follicles. Localized to legs at this time.  -Topical clindamycin  gel for 7-10 days BID

## 2023-11-10 NOTE — Telephone Encounter (Signed)
 Carlos [name] (Key: [id number]) Rx #: [id number] [id number] Ozempic (0.25 or 0.5 MG/DOSE) 2MG Carlos [name] pen-injectors Form [employer] Electronic Prior Authorization Form (2017 NCPDP) Created Sent to Plan Plan Response Submit Clinical Questions Determination Favorable Message from Plan Request Reference Number: [id number]. OZEMPIC INJ 2MG /3ML is approved through 11/09/2024. Your patient may now fill this prescription and it will be covered.. Authorization Expiration Date: Nov 09, 2024.

## 2023-11-10 NOTE — Patient Instructions (Signed)
 Thank you, [name] for allowing us  to provide your care today.   Referrals ordered today:    Referral Orders         Referral to Nutrition and Diabetes Services      I have ordered the following medication/changed the following medications:   Start the following medications: Meds ordered this encounter  Medications   clindamycin  (CLINDAGEL) 1 % gel    Sig: Apply topically 2 (two) times daily for 7 days.    Dispense:  60 g    Refill:  0   empagliflozin  (JARDIANCE ) 10 MG TABS tablet    Sig: Take 1 tablet (10 mg total) by mouth daily before breakfast.    Dispense:  90 tablet    Refill:  2     You have a condition called folliculitis in your legs. Apply clindamycin  gel 7-10 days twice a day.    Measure your glucose at home fasting in the morning and whenever you feel you sugars are low. Hypoglycemia precautions attached. See Abe Abed Pyler to discuss options and how to use a continuous glucose monitor. Start Jardiance . Stop Victoza .  Follow up: 1 month    Should you have any questions or concerns please call the internal medicine clinic at [phone]     Jose Ngo, MD [facility]

## 2023-11-10 NOTE — Progress Notes (Signed)
 CC:  Chief Complaint  Patient presents with   Rash   HPI:  [name] is a 48 y.o. male living with a history stated below and presents today for the above. Please see problem based assessment and plan for additional details.  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  Has been on insulin  in the past, but discontinued due to not liking injecting insulin .     Erectile dysfunction 05/10/2017   Healthcare maintenance 03/01/2020   Hx of medication noncompliance 07/22/2022   Hyperlipidemia 03/04/2019   Hypertension    Hypertension, essential 01/02/2016   Lateral femoral cutaneous entrapment syndrome, right 11/14/2018   Onychomycosis due to dermatophyte 10/24/2012   Pain due to onychomycosis of toenails of both feet 11/03/2017   Pain in joint, ankle and foot 10/24/2012   Pre-ulcerative corn or callous 11/03/2017   Tobacco abuse 01/02/2016   Type 2 diabetes mellitus with hyperglycemia, without long-term current use of insulin  (HCC) 08/10/2015   Vision abnormalities 01/05/2018    Current Outpatient Medications on File Prior to Visit  Medication Sig Dispense Refill   amLODipine  (NORVASC ) 5 MG tablet Take 1 tablet (5 mg total) by mouth daily. 30 tablet 11   benazepril -hydrochlorthiazide (LOTENSIN  HCT) 20-12.5 MG tablet Take 1 tablet by mouth daily. 30 tablet 11   Blood Glucose Monitoring Suppl DEVI 1 each by Does not apply route in the morning, at noon, and at bedtime. May substitute to any manufacturer covered by patient's insurance. 1 each 0   Cinnamon 500 MG capsule Take 500 mg by mouth daily.     glucose blood (ACCU-CHEK GUIDE TEST) test strip 1 EACH BY IN VITRO ROUTE IN THE MORNING, AT NOON, AND AT BEDTIME. MAY SUBSTITUTE TO ANY MANUFACTURER COVERED BY PATIENT'S  INSURANCE. 100 strip 3   glyBURIDE -metformin  (GLUCOVANCE ) 5-500 MG tablet Take 2 tablets by mouth 2 (two) times daily with a meal. 120 tablet 2   pravastatin  (PRAVACHOL ) 20 MG tablet Take 1 tablet (20 mg total) by mouth daily. 30 tablet 2   tadalafil  (CIALIS ) 10 MG tablet Take 1 tablet (10 mg total) by mouth as needed for erectile dysfunction. 20 tablet 1   No current facility-administered medications on file prior to visit.    No family history on file.  Social History   Socioeconomic History   Marital status: Married    Spouse name: Not on file   Number of children: Not on file   Years of education: Not on file   Highest education level: Not on file  Occupational History   Not on file  Tobacco Use   Smoking status: Every Day    Current packs/day: 0.00    Average packs/day: 0.1 packs/day for 10.0 years (1.0 ttl pk-yrs)    Types: Cigars, Cigarettes    Start date: 12/21/2005    Last attempt to quit: 12/22/2015    Years since quitting: 7.8   Smokeless tobacco: Never   Tobacco comments:    Recently quit  Vaping Use   Vaping status: Never Used  Substance and Sexual Activity   Alcohol use: Yes    Comment: OccAsional   Drug use: Yes    Types: Marijuana    Comment: Occasionally.   Sexual activity: Not on file  Other Topics Concern  Not on file  Social History Narrative   Not on file   Social Drivers of Health   Financial Resource Strain: Not on file  Food Insecurity: No Food Insecurity (07/12/2023)   Hunger Vital Sign    Worried About Running Out of Food in the Last Year: Never true    Ran Out of Food in the Last Year: Never true  Transportation Needs: No Transportation Needs (07/12/2023)   PRAPARE - Administrator, Civil Service (Medical): No    Lack of Transportation (Non-Medical): No  Physical Activity: Not on file  Stress: Not on file  Social Connections: Not on file  Intimate Partner Violence: Not At Risk (07/12/2023)   Humiliation, Afraid, Rape, and  Kick questionnaire    Fear of Current or Ex-Partner: No    Emotionally Abused: No    Physically Abused: No    Sexually Abused: No    Review of Systems: ROS negative except for what is noted on the assessment and plan.  Vitals:   11/10/23 0849  BP: 121/70  Pulse: 87  Temp: 98.4 F (36.9 C)  TempSrc: Oral  SpO2: 100%  Weight: 183 lb 6.4 oz (83.2 kg)  Height: 6\' 2"  (1.88 m)    Physical Exam: Constitutional: well-appearing, in NAD  HENT: normocephalic atraumatic, mucous membranes moist Eyes: conjunctiva non-erythematous Cardiovascular: regular rate and rhythm, no m/r/g Pulmonary/Chest: normal work of breathing on room air, lungs clear to auscultation bilaterally Abdominal: soft, non-tender, non-distended MSK: normal bulk and tone Neurological: alert & oriented x 3, no focal deficit Skin: warm and dry, erythematous papules over the right thigh surrounding hair follicles Psych: normal mood and behavior    Assessment & Plan:   Patient discussed with Dr. Adriane Albe  Type 2 diabetes mellitus with hyperglycemia, without long-term current use of insulin  [facility]) Patient has a history of diabetes with a hemoglobin A1c of 12.1 most recently about a month ago.  He used to be on Jardiance  10 mg daily and metformin  at 1000 mg twice daily and semaglutide  mL 3 mg daily however he has not been able to use his Jardiance  due to insurance coverage issues.  He was switched to Victoza  last month but unfortunately he states that this made him feel tense and have headaches and symptoms like his "blood pressure was high ".  He also states that his sugars were low, when asked how low this would go he would state they were in the 90s.  However he would get jittery and see and clammy.  This would resolve whenever he would have something sugary.  I reviewed with him that true hypoglycemia would be when his glucose level is less than 70.  And have reviewed hypoglycemia precautions with him.  I have reviewed that  his body is adjusting to having lower glucose levels since his body is probably due to his sugars being around the 300s.  He is not measuring his glucose when these episodes happen.  I have asked him to measure his glucose in the morning fasting and whenever he feels his sugars are low.   Continue glyburide /metformin  10,000 mg twice daily Start Jardiance  10 mg daily, dc Victoza  Start semaglutide  Referral to [facility] for continuous glucose monitoring education and also nutrition.  Folliculitis Rash that started after Victoza . Reassured pt that it is unlikely related to Victoza  but that diabetes may predispose him to this. He has erythematous papules that surround the hair follicles. Localized to legs at this time.  -Topical clindamycin  gel  for 7-10 days BID   Gerald Kuehl Alexander-Savino, MD [facility] Internal Medicine, PGY-1 Phone: [phone] Date 11/10/2023 Time 1:35 PM

## 2023-11-10 NOTE — Assessment & Plan Note (Signed)
 Patient has a history of diabetes with a hemoglobin A1c of 12.1 most recently about a month ago.  He used to be on Jardiance  10 mg daily and metformin  at 1000 mg twice daily and semaglutide  mL 3 mg daily however he has not been able to use his Jardiance  due to insurance coverage issues.  He was switched to Victoza  last month but unfortunately he states that this made him feel tense and have headaches and symptoms like his "blood pressure was high ".  He also states that his sugars were low, when asked how low this would go he would state they were in the 90s.  However he would get jittery and see and clammy.  This would resolve whenever he would have something sugary.  I reviewed with him that true hypoglycemia would be when his glucose level is less than 70.  And have reviewed hypoglycemia precautions with him.  I have reviewed that his body is adjusting to having lower glucose levels since his body is probably due to his sugars being around the 300s.  He is not measuring his glucose when these episodes happen.  I have asked him to measure his glucose in the morning fasting and whenever he feels his sugars are low.   Continue glyburide /metformin  10,000 mg twice daily Start Jardiance  10 mg daily, dc Victoza  Start semaglutide  Referral to [facility] for continuous glucose monitoring education and also nutrition.

## 2023-11-10 NOTE — Telephone Encounter (Signed)
 Prior Authorization for patient (Ozempic (0.25 or 0.5 MG/DOSE) 2MG /3ML pen-injectors) came through on cover my meds was submitted with last office notes and labs awaiting approval or denial.  KEY: [id number]

## 2023-11-11 NOTE — Progress Notes (Signed)
 Internal Medicine Clinic Attending  Case discussed with the resident at the time of the visit.  We reviewed the resident's history and exam and pertinent patient test results.  I agree with the assessment, diagnosis, and plan of care documented in the resident's note.

## 2023-11-29 ENCOUNTER — Encounter: Payer: Self-pay | Admitting: *Deleted

## 2024-01-03 NOTE — Progress Notes (Unsigned)
 Patient name: Carlos [name] Date of birth: 05/01/1976 Date of visit: 01/03/24  Type of visit: Established Patient Office Visit  Subjective   Chief concern: No chief complaint on file.   Carlos [name] is a 48 y.o. male with a PMHx of T2DM, HTN, HLD, TUD, ED who presents to [facility] clinic ***.   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   Constipation 01/05/2018   Vision abnormalities 01/05/2018   Pre-ulcerative corn or callous 11/03/2017   Erectile dysfunction 05/10/2017   Hypertension, essential 01/02/2016   Tobacco abuse 01/02/2016   Type 2 diabetes mellitus with hyperglycemia, without long-term current use of insulin  (HCC) 08/10/2015   Onychomycosis due to dermatophyte 10/24/2012   Pain in joint, ankle and foot 10/24/2012     Past Surgical History:  Procedure Laterality Date   FEMUR FRACTURE SURGERY     right   INCISION AND DRAINAGE PERIRECTAL ABSCESS N/A 01/02/2016   Procedure: IRRIGATION AND DEBRIDEMENT PERIRECTAL ABSCESS;  Surgeon: Lynda Leos, MD;  Location: [facility] OR;  Service: General;  Laterality: N/A;    ROS  Current Outpatient Medications  Medication Instructions   amLODipine  (NORVASC ) 5 mg, Oral, Daily   benazepril -hydrochlorthiazide (LOTENSIN  HCT) 20-12.5 MG tablet 1 tablet, Oral, Daily   Blood Glucose Monitoring Suppl DEVI 1 each, Does not apply, 3 times daily, May substitute to any manufacturer covered by patient's insurance.   Cinnamon 500 mg, Oral, Daily   empagliflozin  (JARDIANCE ) 10 mg, Oral, Daily before breakfast   glucose blood (ACCU-CHEK GUIDE TEST) test strip 1 EACH BY IN VITRO ROUTE IN THE MORNING, AT NOON, AND AT BEDTIME. MAY SUBSTITUTE TO ANY MANUFACTURER COVERED BY PATIENT'S INSURANCE.   glyBURIDE -metformin  (GLUCOVANCE )  5-500 MG tablet 2 tablets, Oral, 2 times daily with meals   Insulin  Pen Needle (PEN NEEDLES) 32G X 4 MM MISC 1 Needle, Does not apply, As directed   pravastatin  (PRAVACHOL ) 20 mg, Oral, Daily   Semaglutide (0.25 or 0.5MG /DOS) 0.25 mg, Subcutaneous, Weekly   tadalafil  (CIALIS ) 10 mg, Oral, As needed    Social History   Tobacco Use   Smoking status: Every Day    Current packs/day: 0.00    Average packs/day: 0.1 packs/day for 10.0 years (1.0 ttl pk-yrs)    Types: Cigars, Cigarettes    Start date: 12/21/2005    Last attempt to quit: 12/22/2015    Years since quitting: 8.0   Smokeless tobacco: Never   Tobacco comments:    Recently quit  Vaping Use   Vaping status: Never Used  Substance Use Topics   Alcohol use: Yes    Comment: OccAsional   Drug use: Yes    Types: Marijuana    Comment: Occasionally.      Objective  There were no vitals filed for this visit.There is no height or weight on file to calculate BMI.   Physical Exam: Constitutional: well-appearing, well-nourished; *** weight; no acute distress HENT: normocephalic atraumatic, mucous membranes moist Eyes: conjunctiva non-erythematous Cardiovascular: regular rate and rhythm, no m/r/g Pulmonary/Chest: normal work of breathing on room air, lungs clear to auscultation bilaterally Abdominal: soft, non-tender, non-distended MSK: normal bulk and tone Neurological: alert & oriented x 3, no focal deficit Skin: warm and dry Extremities: BLE without edema or erythema. Psych: normal mood and behavior  {Labs (Optional):23779}    Assessment &  Plan  There are no diagnoses linked to this encounter.  No follow-ups on file.  T2DM -jardiance  10mg  -glyburide -metformin  (glucovance ) 10-1000mg  bid -semaglutide  0.25/0.5 mg weekly  HTN -Amlodipine  5mg , Benazepril -hydrochlorothiazide  20-12.5mg   HLD -pravastatin  20mg  daily  ED -cialis  10mg  prn  Patient discussed with Dr. {imcattendings:33109}, who also saw and evaluated the  patient.  Asael Pann, MD [facility]  PGY-1 01/03/2024, 10:08 PM

## 2024-01-04 ENCOUNTER — Encounter

## 2024-01-04 ENCOUNTER — Encounter: Admitting: Dietician

## 2024-01-04 DIAGNOSIS — I10 Essential (primary) hypertension: Secondary | ICD-10-CM

## 2024-01-04 DIAGNOSIS — E1165 Type 2 diabetes mellitus with hyperglycemia: Secondary | ICD-10-CM

## 2024-01-06 ENCOUNTER — Encounter (HOSPITAL_BASED_OUTPATIENT_CLINIC_OR_DEPARTMENT_OTHER): Payer: Self-pay | Admitting: Emergency Medicine

## 2024-01-06 ENCOUNTER — Emergency Department (HOSPITAL_BASED_OUTPATIENT_CLINIC_OR_DEPARTMENT_OTHER)
Admission: EM | Admit: 2024-01-06 | Discharge: 2024-01-07 | Disposition: A | Discharge status: Home / Self Care | Attending: Emergency Medicine | Admitting: Emergency Medicine

## 2024-01-06 ENCOUNTER — Other Ambulatory Visit: Payer: Self-pay

## 2024-01-06 DIAGNOSIS — Z794 Long term (current) use of insulin: Secondary | ICD-10-CM | POA: Diagnosis not present

## 2024-01-06 DIAGNOSIS — I10 Essential (primary) hypertension: Secondary | ICD-10-CM | POA: Diagnosis not present

## 2024-01-06 DIAGNOSIS — Z79899 Other long term (current) drug therapy: Secondary | ICD-10-CM | POA: Insufficient documentation

## 2024-01-06 DIAGNOSIS — R739 Hyperglycemia, unspecified: Secondary | ICD-10-CM

## 2024-01-06 DIAGNOSIS — R112 Nausea with vomiting, unspecified: Secondary | ICD-10-CM

## 2024-01-06 DIAGNOSIS — E860 Dehydration: Secondary | ICD-10-CM | POA: Insufficient documentation

## 2024-01-06 DIAGNOSIS — E1165 Type 2 diabetes mellitus with hyperglycemia: Secondary | ICD-10-CM | POA: Insufficient documentation

## 2024-01-06 LAB — URINALYSIS, W/ REFLEX TO CULTURE (INFECTION SUSPECTED)
Bilirubin Urine: NEGATIVE
Glucose, UA: >=500 mg/dL — AB
Hgb urine dipstick: NEGATIVE
Ketones, ur: >=80 mg/dL — AB
Leukocytes,Ua: NEGATIVE
Nitrite: NEGATIVE
Protein, ur: NEGATIVE mg/dL
Specific Gravity, Urine: >=1.03 (ref 1.005–1.030)
pH: 5.5 (ref 5.0–8.0)

## 2024-01-06 LAB — TROPONIN T, HIGH SENSITIVITY: Troponin T High Sensitivity: <15 ng/L (ref <19)

## 2024-01-06 LAB — CBC WITH DIFFERENTIAL/PLATELET
Abs Immature Granulocytes: 0.04 K/uL (ref 0.00–0.07)
Basophils Absolute: 0 K/uL (ref 0.0–0.1)
Basophils Relative: 0 %
Eosinophils Absolute: 0 K/uL (ref 0.0–0.5)
Eosinophils Relative: 0 %
HCT: 40 % (ref 39.0–52.0)
Hemoglobin: 14 g/dL (ref 13.0–17.0)
Immature Granulocytes: 0 %
Lymphocytes Relative: 9 %
Lymphs Abs: 0.9 K/uL (ref 0.7–4.0)
MCH: 32.9 pg (ref 26.0–34.0)
MCHC: 35 g/dL (ref 30.0–36.0)
MCV: 94.1 fL (ref 80.0–100.0)
Monocytes Absolute: 0.4 K/uL (ref 0.1–1.0)
Monocytes Relative: 4 %
Neutro Abs: 7.7 K/uL (ref 1.7–7.7)
Neutrophils Relative %: 87 %
Platelets: 306 K/uL (ref 150–400)
RBC: 4.25 MIL/uL (ref 4.22–5.81)
RDW: 13.8 % (ref 11.5–15.5)
WBC: 9 K/uL (ref 4.0–10.5)
nRBC: 0 % (ref 0.0–0.2)

## 2024-01-06 LAB — CBG MONITORING, ED: Glucose-Capillary: 159 mg/dL — ABNORMAL HIGH (ref 70–99)

## 2024-01-06 LAB — I-STAT VENOUS BLOOD GAS, ED
Acid-Base Excess: 1 mmol/L (ref 0.0–2.0)
Bicarbonate: 25.9 mmol/L (ref 20.0–28.0)
Calcium, Ion: 1.13 mmol/L — ABNORMAL LOW (ref 1.15–1.40)
HCT: 38 % — ABNORMAL LOW (ref 39.0–52.0)
Hemoglobin: 12.9 g/dL — ABNORMAL LOW (ref 13.0–17.0)
O2 Saturation: 77 %
Patient temperature: 98
Potassium: 3.9 mmol/L (ref 3.5–5.1)
Sodium: 138 mmol/L (ref 135–145)
TCO2: 27 mmol/L (ref 22–32)
pCO2, Ven: 39.9 mmHg — ABNORMAL LOW (ref 44–60)
pH, Ven: 7.419 (ref 7.25–7.43)
pO2, Ven: 40 mmHg (ref 32–45)

## 2024-01-06 LAB — COMPREHENSIVE METABOLIC PANEL WITH GFR
ALT: 21 U/L (ref 0–44)
AST: 29 U/L (ref 15–41)
Albumin: 5.1 g/dL — ABNORMAL HIGH (ref 3.5–5.0)
Alkaline Phosphatase: 69 U/L (ref 38–126)
Anion gap: 20 — ABNORMAL HIGH (ref 5–15)
BUN: 16 mg/dL (ref 6–20)
CO2: 23 mmol/L (ref 22–32)
Calcium: 9.9 mg/dL (ref 8.9–10.3)
Chloride: 97 mmol/L — ABNORMAL LOW (ref 98–111)
Creatinine, Ser: 0.98 mg/dL (ref 0.61–1.24)
GFR, Estimated: >60 mL/min (ref >60)
Glucose, Bld: 170 mg/dL — ABNORMAL HIGH (ref 70–99)
Potassium: 3.9 mmol/L (ref 3.5–5.1)
Sodium: 140 mmol/L (ref 135–145)
Total Bilirubin: 0.6 mg/dL (ref 0.0–1.2)
Total Protein: 8 g/dL (ref 6.5–8.1)

## 2024-01-06 LAB — LACTIC ACID, PLASMA: Lactic Acid, Venous: 2.2 mmol/L (ref 0.5–1.9)

## 2024-01-06 LAB — LIPASE, BLOOD: Lipase: 39 U/L (ref 11–51)

## 2024-01-06 MED ORDER — LACTATED RINGERS IV BOLUS
1000.0000 mL | Freq: Once | INTRAVENOUS | Status: AC
  Administered 2024-01-06: 1000 mL via INTRAVENOUS

## 2024-01-06 MED ORDER — ONDANSETRON HCL 4 MG/2ML IJ SOLN
4.0000 mg | Freq: Once | INTRAMUSCULAR | Status: AC
  Administered 2024-01-06: 4 mg via INTRAVENOUS
  Filled 2024-01-06: qty 2

## 2024-01-06 NOTE — ED Notes (Signed)
 Lactic acid 2.2. Primary RN made aware.

## 2024-01-06 NOTE — ED Triage Notes (Signed)
 Pt with NV that started today; reports BP is elevated; had a couple of drinks last night, but nothing out of the ordinary

## 2024-01-06 NOTE — ED Provider Notes (Signed)
 [place] [facility] Provider Note   CSN: [id number] Arrival date & time: 01/06/24  2016     Patient presents with: Emesis   Carlos [name] is a 48 y.o. male with a past medical history of T2DM, HTN, and HLD who presents to the ED for nausea and vomiting that began earlier today. Wife is at bedside. Patient went to friend's house last night, where he ate Kielbasa sausage that was potentially undercooked. He denies sick contacts, hematemesis, diarrhea, SOB, or chest pain. He has had minimal PO intake today, with his last episode of emesis around 5pm. He was unable to take his morning medications today. He endorses intermittent chills and abdominal tenderness that flares during an active emesis episode. He is on Jardiance  and Metformin , without any recent changes to his medication dosages. Denies being on semaglutide  or having started the med.     Prior to Admission medications   Medication Sig Start Date End Date Taking? Authorizing Provider  amLODipine  (NORVASC ) 5 MG tablet Take 1 tablet (5 mg total) by mouth daily. 10/02/23 10/01/24  Addie Perkins, DO  benazepril -hydrochlorthiazide (LOTENSIN  HCT) 20-12.5 MG tablet Take 1 tablet by mouth daily. 08/14/23 08/13/24  Masters, Katie, DO  Blood Glucose Monitoring Suppl DEVI 1 each by Does not apply route in the morning, at noon, and at bedtime. May substitute to any manufacturer covered by patient's insurance. 05/08/23   Justus, Michael, MD  Cinnamon 500 MG capsule Take 500 mg by mouth daily.    [provider]  empagliflozin  (JARDIANCE ) 10 MG TABS tablet Take 1 tablet (10 mg total) by mouth daily before breakfast. 11/10/23   Alexander-Savino, [place], MD  glucose blood (ACCU-CHEK GUIDE TEST) test strip 1 EACH BY IN VITRO ROUTE IN THE MORNING, AT NOON, AND AT BEDTIME. MAY SUBSTITUTE TO ANY MANUFACTURER COVERED BY PATIENT'S INSURANCE. 06/20/23   Masters, Izetta, DO  glyBURIDE -metformin  (GLUCOVANCE ) 5-500 MG tablet  Take 2 tablets by mouth 2 (two) times daily with a meal. 10/02/23   Addie Perkins, DO  Insulin  Pen Needle (PEN NEEDLES) 32G X 4 MM MISC 1 Needle by Does not apply route as directed. 11/10/23   Alexander-Savino, [place], MD  pravastatin  (PRAVACHOL ) 20 MG tablet Take 1 tablet (20 mg total) by mouth daily. 10/02/23   Addie Perkins, DO  Semaglutide ,0.25 or 0.5MG /DOS, 2 MG/3ML SOPN Inject 0.25 mg into the skin once a week. 11/10/23   Alexander-Savino, [place], MD  tadalafil  (CIALIS ) 10 MG tablet Take 1 tablet (10 mg total) by mouth as needed for erectile dysfunction. 05/08/23 06/07/23  Gregary Sharper, MD    Allergies: Penicillins and Sitagliptin phos-metformin  hcl    Review of Systems  Constitutional:  Positive for chills. Negative for fever.  Respiratory:  Negative for cough, shortness of breath and wheezing.   Cardiovascular:  Negative for chest pain and palpitations.  Gastrointestinal:  Positive for abdominal pain, nausea and vomiting. Negative for diarrhea.  Genitourinary:  Negative for dysuria.  Neurological:  Negative for dizziness and light-headedness.  All other systems reviewed and are negative.   Updated Vital Signs BP (!) 189/92 (BP Location: Right Arm)   Pulse (!) 110   Temp 98 F (36.7 C)   Resp 18   Ht 6' 2 (1.88 m)   Wt 83.2 kg   SpO2 99%   BMI 23.55 kg/m   Physical Exam Constitutional:      Appearance: Normal appearance.  HENT:     Head: Normocephalic and atraumatic.  Eyes:  Extraocular Movements: Extraocular movements intact.     Pupils: Pupils are equal, round, and reactive to light.  Cardiovascular:     Rate and Rhythm: Regular rhythm. Tachycardia present.  Abdominal:     General: Bowel sounds are normal.     Palpations: Abdomen is soft.     Tenderness: There is no abdominal tenderness.     Comments: No tenderness to palpation.  Musculoskeletal:        General: Normal range of motion.     Cervical back: Normal range of motion.  Skin:    General: Skin is  warm and dry.  Neurological:     General: No focal deficit present.     Mental Status: He is alert and oriented to person, place, and time.     (all labs ordered are listed, but only abnormal results are displayed) Labs Reviewed  CBC WITH DIFFERENTIAL/PLATELET  COMPREHENSIVE METABOLIC PANEL WITH GFR  LIPASE, BLOOD  URINALYSIS, W/ REFLEX TO CULTURE (INFECTION SUSPECTED)    EKG: None  Radiology: No results found.   Procedures   Medications Ordered in the ED - No data to display                                  Medical Decision Making Patient presented to ED for new nausea and vomiting that began earlier today. Differential diagnosis includes viral gastritis vs DKA vs aortic dissection vs pancreatitis vs bacterial gastritis. Will order CBC, CMP, troponins, CBG monitoring, lipase, EKG, and UA. Will give Zofran  for nausea.   10PM  EKG shows sinus tachycardia with ST changes, similar to prior EKG. CBC normal. CBG 159.  1045PM Troponins negative. Lipase negative. CMP shows anion gap of 20. Chloride 97, bicarb 23, albumin 5.1. Due to suspicious anion gap, will order VBG, Beta-hydroxybutyric acid, and lactic acid to r/o DKA. Signed out pending labs.   Amount and/or Complexity of Data Reviewed Labs: ordered.  Risk Prescription drug management.      Final diagnoses:  None    ED Discharge Orders     None          Imri Lor, DO 01/06/24 2251    Dreama Longs, MD 01/07/24 1141

## 2024-01-06 NOTE — ED Notes (Signed)
 Informed patient of need for urine specimen. Pt states he does not need to urinate at this time.

## 2024-01-06 NOTE — ED Provider Notes (Signed)
 4:19 AM Assumed care from Dr. Dreama, please see their note for full history, physical and decision making until this point. In brief this is a 48 y.o. year old male who presented to the ED tonight with Emesis     N/V and pain today. Pending labs. Found to have small gap. Getting LA, BHB, fluids. Fu labs/urine.   Patient borderline DKA, however HR improving, gap improving, tolerating PO, requesting D/C. Long discussion and will d/c w/ close PCP follow up I suspect that dehydration from the GI illness was playing into his lab abnormalities quite a bit. Will follow closely with his endocrinologist, return here if any vomiting, lightheadedness, increase in thirst/urine or other issues before seeing endocrine.   Discharge instructions, including strict return precautions for new or worsening symptoms, given. Patient and/or family verbalized understanding and agreement with the plan as described.   Labs, studies and imaging reviewed by myself and considered in medical decision making if ordered. Imaging interpreted by radiology.  Labs Reviewed  COMPREHENSIVE METABOLIC PANEL WITH GFR - Abnormal; Notable for the following components:      Result Value   Chloride 97 (*)    Glucose, Bld 170 (*)    Albumin 5.1 (*)    Anion gap 20 (*)    All other components within normal limits  URINALYSIS, W/ REFLEX TO CULTURE (INFECTION SUSPECTED) - Abnormal; Notable for the following components:   Glucose, UA >=500 (*)    Ketones, ur >=80 (*)    Bacteria, UA RARE (*)    All other components within normal limits  LACTIC ACID, PLASMA - Abnormal; Notable for the following components:   Lactic Acid, Venous 2.2 (*)    All other components within normal limits  BETA-HYDROXYBUTYRIC ACID - Abnormal; Notable for the following components:   Beta-Hydroxybutyric Acid 3.84 (*)    All other components within normal limits  BASIC METABOLIC PANEL WITH GFR - Abnormal; Notable for the following components:   Glucose, Bld  163 (*)    Anion gap 18 (*)    All other components within normal limits  CBG MONITORING, ED - Abnormal; Notable for the following components:   Glucose-Capillary 159 (*)    All other components within normal limits  I-STAT VENOUS BLOOD GAS, ED - Abnormal; Notable for the following components:   pCO2, Ven 39.9 (*)    Calcium , Ion 1.13 (*)    HCT 38.0 (*)    Hemoglobin 12.9 (*)    All other components within normal limits  CBG MONITORING, ED - Abnormal; Notable for the following components:   Glucose-Capillary 179 (*)    All other components within normal limits  CBC WITH DIFFERENTIAL/PLATELET  LIPASE, BLOOD  LACTIC ACID, PLASMA  TROPONIN T, HIGH SENSITIVITY  TROPONIN T, HIGH SENSITIVITY    No orders to display    No follow-ups on file.    Treena Cosman, Selinda, MD 01/07/24 [phone]

## 2024-01-06 NOTE — ED Notes (Signed)
CBG=159  

## 2024-01-06 NOTE — ED Notes (Signed)
 Patient provided a diet shasta sprite and graham crackers for PO challenge.

## 2024-01-06 NOTE — ED Provider Notes (Incomplete)
 11:20 PM Assumed care from Dr. Dreama, please see their note for full history, physical and decision making until this point. In brief this is a 48 y.o. year old male who presented to the ED tonight with Emesis     N/V and pain today. Pending labs. Found to have small gap. Getting LA, BHB, fluids. Fu labs/urine.   Discharge instructions, including strict return precautions for new or worsening symptoms, given. Patient and/or family verbalized understanding and agreement with the plan as described.   Labs, studies and imaging reviewed by myself and considered in medical decision making if ordered. Imaging interpreted by radiology.  Labs Reviewed  COMPREHENSIVE METABOLIC PANEL WITH GFR - Abnormal; Notable for the following components:      Result Value   Chloride 97 (*)    Glucose, Bld 170 (*)    Albumin 5.1 (*)    Anion gap 20 (*)    All other components within normal limits  CBG MONITORING, ED - Abnormal; Notable for the following components:   Glucose-Capillary 159 (*)    All other components within normal limits  I-STAT VENOUS BLOOD GAS, ED - Abnormal; Notable for the following components:   pCO2, Ven 39.9 (*)    Calcium , Ion 1.13 (*)    HCT 38.0 (*)    Hemoglobin 12.9 (*)    All other components within normal limits  CBC WITH DIFFERENTIAL/PLATELET  LIPASE, BLOOD  URINALYSIS, W/ REFLEX TO CULTURE (INFECTION SUSPECTED)  LACTIC ACID, PLASMA  LACTIC ACID, PLASMA  BETA-HYDROXYBUTYRIC ACID  TROPONIN T, HIGH SENSITIVITY  TROPONIN T, HIGH SENSITIVITY    No orders to display    No follow-ups on file.

## 2024-01-07 LAB — CBG MONITORING, ED: Glucose-Capillary: 179 mg/dL — ABNORMAL HIGH (ref 70–99)

## 2024-01-07 LAB — BASIC METABOLIC PANEL WITH GFR
Anion gap: 18 — ABNORMAL HIGH (ref 5–15)
BUN: 16 mg/dL (ref 6–20)
CO2: 22 mmol/L (ref 22–32)
Calcium: 9.3 mg/dL (ref 8.9–10.3)
Chloride: 98 mmol/L (ref 98–111)
Creatinine, Ser: 0.85 mg/dL (ref 0.61–1.24)
GFR, Estimated: >60 mL/min (ref >60)
Glucose, Bld: 163 mg/dL — ABNORMAL HIGH (ref 70–99)
Potassium: 3.7 mmol/L (ref 3.5–5.1)
Sodium: 138 mmol/L (ref 135–145)

## 2024-01-07 LAB — BETA-HYDROXYBUTYRIC ACID: Beta-Hydroxybutyric Acid: 3.84 mmol/L — ABNORMAL HIGH (ref 0.05–0.27)

## 2024-01-07 LAB — TROPONIN T, HIGH SENSITIVITY: Troponin T High Sensitivity: <15 ng/L (ref <19)

## 2024-01-07 LAB — LACTIC ACID, PLASMA: Lactic Acid, Venous: 1.8 mmol/L (ref 0.5–1.9)

## 2024-01-07 MED ORDER — LACTATED RINGERS IV BOLUS
1000.0000 mL | Freq: Once | INTRAVENOUS | Status: AC
Start: 2024-01-07 — End: 2024-01-07
  Administered 2024-01-07: 1000 mL via INTRAVENOUS

## 2024-01-07 MED ORDER — ONDANSETRON 4 MG PO TBDP: 4.0000 mg | ORAL_TABLET | Freq: Three times a day (TID) | ORAL | 0 refills | Status: AC | PRN

## 2024-01-07 NOTE — ED Notes (Signed)
 CBG 179

## 2024-01-08 ENCOUNTER — Telehealth: Payer: Self-pay

## 2024-01-08 NOTE — Telephone Encounter (Signed)
 Pharmacy is requesting a medication refill for Clindamycin , medication has expired off of med list.  [id number] - [place], [place] - [address] AT [address]CROSS FROM [address]

## 2024-01-13 ENCOUNTER — Other Ambulatory Visit: Payer: Self-pay | Admitting: Student

## 2024-01-13 DIAGNOSIS — L739 Follicular disorder, unspecified: Secondary | ICD-10-CM

## 2024-01-13 MED ORDER — CLINDAMYCIN PHOS (TWICE-DAILY) 1 % EX GEL: Freq: Two times a day (BID) | CUTANEOUS | 0 refills | Status: AC

## 2024-01-15 ENCOUNTER — Other Ambulatory Visit: Payer: Self-pay

## 2024-01-15 DIAGNOSIS — E785 Hyperlipidemia, unspecified: Secondary | ICD-10-CM

## 2024-01-15 MED ORDER — PRAVASTATIN SODIUM 20 MG PO TABS: 20.0000 mg | ORAL_TABLET | Freq: Every day | ORAL | 2 refills | Status: AC

## 2024-01-15 NOTE — Telephone Encounter (Signed)
 Medication sent to pharmacy

## 2024-01-19 ENCOUNTER — Other Ambulatory Visit: Payer: Self-pay

## 2024-01-19 DIAGNOSIS — E119 Type 2 diabetes mellitus without complications: Secondary | ICD-10-CM

## 2024-01-19 MED ORDER — GLYBURIDE-METFORMIN 5-500 MG PO TABS: 2.0000 | ORAL_TABLET | Freq: Two times a day (BID) | ORAL | 2 refills | Status: DC

## 2024-01-19 NOTE — Telephone Encounter (Signed)
 Medication sent to pharmacy

## 2024-05-17 ENCOUNTER — Other Ambulatory Visit: Payer: Self-pay

## 2024-05-17 DIAGNOSIS — E119 Type 2 diabetes mellitus without complications: Secondary | ICD-10-CM

## 2024-05-17 MED ORDER — GLYBURIDE-METFORMIN 5-500 MG PO TABS: 2.0000 | ORAL_TABLET | Freq: Two times a day (BID) | ORAL | 2 refills | Status: AC

## 2024-05-17 NOTE — Telephone Encounter (Signed)
 Medication sent to pharmacy
